# Patient Record
Sex: Female | Born: 1945 | Hispanic: No | Marital: Single | Smoking: Current every day smoker
Health system: Southern US, Community
[De-identification: ages and names within clinical notes are randomized; demographics above are authoritative.]

## PROBLEM LIST (undated history)

## (undated) DIAGNOSIS — E0590 Thyrotoxicosis, unspecified without thyrotoxic crisis or storm: Secondary | ICD-10-CM

## (undated) DIAGNOSIS — Z9889 Other specified postprocedural states: Secondary | ICD-10-CM

## (undated) DIAGNOSIS — K449 Diaphragmatic hernia without obstruction or gangrene: Secondary | ICD-10-CM

## (undated) DIAGNOSIS — I10 Essential (primary) hypertension: Secondary | ICD-10-CM

## (undated) DIAGNOSIS — J439 Emphysema, unspecified: Secondary | ICD-10-CM

## (undated) DIAGNOSIS — Z803 Family history of malignant neoplasm of breast: Secondary | ICD-10-CM

## (undated) DIAGNOSIS — M810 Age-related osteoporosis without current pathological fracture: Secondary | ICD-10-CM

## (undated) DIAGNOSIS — T4145XA Adverse effect of unspecified anesthetic, initial encounter: Secondary | ICD-10-CM

## (undated) DIAGNOSIS — H269 Unspecified cataract: Secondary | ICD-10-CM

## (undated) DIAGNOSIS — C50919 Malignant neoplasm of unspecified site of unspecified female breast: Secondary | ICD-10-CM

## (undated) DIAGNOSIS — I499 Cardiac arrhythmia, unspecified: Secondary | ICD-10-CM

## (undated) DIAGNOSIS — R112 Nausea with vomiting, unspecified: Secondary | ICD-10-CM

## (undated) DIAGNOSIS — T8859XA Other complications of anesthesia, initial encounter: Secondary | ICD-10-CM

## (undated) HISTORY — DX: Unspecified cataract: H26.9

## (undated) HISTORY — PX: CATARACT EXTRACTION: SUR2

## (undated) HISTORY — DX: Cardiac arrhythmia, unspecified: I49.9

## (undated) HISTORY — DX: Age-related osteoporosis without current pathological fracture: M81.0

## (undated) HISTORY — DX: Essential (primary) hypertension: I10

## (undated) HISTORY — DX: Family history of malignant neoplasm of breast: Z80.3

## (undated) HISTORY — DX: Emphysema, unspecified: J43.9

---

## 1990-04-01 DIAGNOSIS — K449 Diaphragmatic hernia without obstruction or gangrene: Secondary | ICD-10-CM

## 1990-04-01 HISTORY — DX: Diaphragmatic hernia without obstruction or gangrene: K44.9

## 1993-04-01 DIAGNOSIS — C50919 Malignant neoplasm of unspecified site of unspecified female breast: Secondary | ICD-10-CM

## 1993-04-01 HISTORY — PX: MASTECTOMY: SHX3

## 1993-04-01 HISTORY — DX: Malignant neoplasm of unspecified site of unspecified female breast: C50.919

## 1993-06-22 HISTORY — PX: TOTAL ABDOMINAL HYSTERECTOMY: SHX209

## 2003-01-31 ENCOUNTER — Other Ambulatory Visit: Admission: RE | Admit: 2003-01-31 | Discharge: 2003-01-31 | Payer: Self-pay | Admitting: Obstetrics & Gynecology

## 2003-05-11 ENCOUNTER — Other Ambulatory Visit: Admission: RE | Admit: 2003-05-11 | Discharge: 2003-05-11 | Payer: Self-pay | Admitting: Obstetrics & Gynecology

## 2003-08-03 ENCOUNTER — Other Ambulatory Visit: Admission: RE | Admit: 2003-08-03 | Discharge: 2003-08-03 | Payer: Self-pay | Admitting: Obstetrics & Gynecology

## 2004-02-02 ENCOUNTER — Other Ambulatory Visit: Admission: RE | Admit: 2004-02-02 | Discharge: 2004-02-02 | Payer: Self-pay | Admitting: Obstetrics & Gynecology

## 2004-09-04 ENCOUNTER — Other Ambulatory Visit: Admission: RE | Admit: 2004-09-04 | Discharge: 2004-09-04 | Payer: Self-pay | Admitting: Obstetrics & Gynecology

## 2016-09-26 LAB — PULMONARY FUNCTION TEST

## 2017-09-19 ENCOUNTER — Other Ambulatory Visit: Payer: Self-pay | Admitting: Obstetrics & Gynecology

## 2017-09-19 ENCOUNTER — Other Ambulatory Visit: Payer: Self-pay

## 2017-09-19 DIAGNOSIS — N63 Unspecified lump in unspecified breast: Secondary | ICD-10-CM

## 2017-09-19 DIAGNOSIS — N644 Mastodynia: Secondary | ICD-10-CM

## 2017-10-06 ENCOUNTER — Other Ambulatory Visit: Payer: Self-pay | Admitting: Obstetrics & Gynecology

## 2017-10-06 DIAGNOSIS — N6310 Unspecified lump in the right breast, unspecified quadrant: Secondary | ICD-10-CM

## 2017-10-20 ENCOUNTER — Other Ambulatory Visit: Payer: Self-pay

## 2017-10-22 ENCOUNTER — Other Ambulatory Visit: Payer: Self-pay

## 2017-10-22 ENCOUNTER — Ambulatory Visit
Admission: RE | Admit: 2017-10-22 | Discharge: 2017-10-22 | Disposition: A | Discharge status: Home / Self Care | Payer: Medicare Other | Source: Ambulatory Visit | Attending: Obstetrics & Gynecology | Admitting: Obstetrics & Gynecology

## 2017-10-22 DIAGNOSIS — N6310 Unspecified lump in the right breast, unspecified quadrant: Secondary | ICD-10-CM

## 2017-10-27 ENCOUNTER — Telehealth: Payer: Self-pay | Admitting: Oncology

## 2017-10-27 ENCOUNTER — Encounter: Payer: Self-pay | Admitting: Oncology

## 2017-10-27 ENCOUNTER — Telehealth: Payer: Self-pay | Admitting: *Deleted

## 2017-10-27 ENCOUNTER — Encounter: Payer: Self-pay | Admitting: Radiation Oncology

## 2017-10-27 NOTE — Telephone Encounter (Signed)
New referral received via msg from [facility]. Pt has been scheduled to see Dr. Jana Hakim on 8/8 at 4pm. Pt aware to arrive 30 minutes early. Letter mailed.

## 2017-10-27 NOTE — Telephone Encounter (Signed)
Spoke to pt regarding appt with Dr. Donne Hazel on 8/8. Confirmed appt date and time. Discussed would work on coordinating med/rad onc apts. Denies further needs at this time.

## 2017-11-03 NOTE — Progress Notes (Signed)
[name]  [phone] [phone]     ID: Elaine [name] DOB: [birth year]  MR#: [id number]  LKJ#:[id number]  Patient Care Team: Quentin Cornwall, MD as PCP - General (Family Medicine) Magrinat, Virgie Dad, MD as Consulting Physician (Oncology) Kyung Rudd, MD as Consulting Physician (Radiation Oncology) Rolm Bookbinder, MD as Consulting Physician (General Surgery) Lelon Perla, MD as Referring Physician (Internal Medicine) Maisie Fus, MD as Consulting Physician (Obstetrics and Gynecology) OTHER MD:  CHIEF COMPLAINT: Estrogen receptor positive lobular breast cancer  CURRENT TREATMENT: Awaiting definitive surgery   HISTORY OF CURRENT ILLNESS: Elaine [name] has a history of left breast cancer, status post mastectomy at [facility] under Dr. Arman Bogus in 1995.  She recalls this is being lymph node negative.  She did not receive radiation or chemotherapy but did take tamoxifen for 2 years.  Note that because of osteoporosis issues she was treated with raloxifene/Evista for approximately 8 years, between 1998 and 2005 per her recollection..   More recently she noted a change in her left breast while taking a shower in April 2019  She was already scheduled for mammography in June, which is when she had a unilateral right diagnostic mammography with tomography at [facility] in [place], [place] on 09/26/2017 showing: Highly suspicious mass within the lower inner quadrant of the right breast measuring 1.1 cm.  There is no mention of axillary imaging in the ultrasound report.  Accordingly on 10/22/2017 she proceeded to biopsy of the right breast area in question. The pathology from this procedure showed ([id number]): Breast, right, needle core biopsy, lower inner 5 o'clock position with invasive lobular carcinoma, E-cadherin negative, grade 1. Prognostic indicators significant for: Estrogen receptor, 70% positive with moderate staining intensity  and progesterone receptor, 80% positive with strong staining intensity. Proliferation marker Ki67 at 10%. HER2 negative with a signals ratio of 1.62 and number per cell 3.65  The patient's subsequent history is as detailed below.  INTERVAL HISTORY: The patient was evaluated in the multidisciplinary breast cancer clinic on 11/06/2017 accompanied by her sister, Tharon Aquas and her friend, Juanda Crumble. Her case was also presented at the multidisciplinary breast cancer conference 11/05/2017. At that time a preliminary plan was proposed: Breast MRI, breast conserving surgery surgery, genetics, Oncotype, possibly radiation.   REVIEW OF SYSTEMS: Macaria reports that for exercise, she was working out in the gym up until April 2019, when she backed off due to increasing allergies. 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 She was placed on oxygen on Monday, 11/03/2017 at which time she discontinued smoking. She denies any skin cancers. The patient denies unusual headaches, visual changes, nausea, vomiting, stiff neck, dizziness, or gait imbalance. There has been no worsening cough, phlegm production, or pleurisy, no chest pain or pressure, and no change in bowel or bladder habits. The patient denies fever, rash, bleeding, unexplained fatigue or unexplained weight loss. A detailed review of systems was otherwise entirely negative.   PAST MEDICAL HISTORY: Past Medical History:  Diagnosis Date  . Breast cancer ([place]) 1995   left   . Cataract   . Emphysema of lung ([place])   . Family history of breast cancer   . Hiatal hernia 1992  . Hypertension   . Hyperthyroidism   . Osteoporosis     PAST SURGICAL HISTORY: Past Surgical History:  Procedure Laterality Date  . TOTAL ABDOMINAL HYSTERECTOMY  06/22/1993    FAMILY HISTORY Family History  Problem Relation Age of Onset  .  Breast cancer Cousin 18  . Breast cancer Cousin        20's/30's  . Breast cancer Cousin        3 of Mcousins1xremoved (mom's  cousins) had breast cancer 70+   She notes that her father died from MI at age 25. Patients' mother died from aneurysm at age 54. The patient had 1 brother (died age 36 months from flu) and 2 sisters (1 deceased at 28 due to complications from DM).  The patient had 4 maternal great cousins with breast cancer all diagnosed in their 76's.  A paternal cousins daughter was diagnosed with breast cancer in her 34s. Patient denies anyone in her family having ovarian cancer.   GYNECOLOGIC HISTORY:  No LMP recorded. Menarche: 72 years old Age at first live birth: [place] P0 LMP: Hysterectomy with bilateral salpingo-oophorectomy at age 69: No HRT  SOCIAL HISTORY: She is semi-retired from Audiological scientist. She is single and lives alone. She doesn't have any pets. She is a Tourist information centre manager.      ADVANCED DIRECTIVES: Her HCPOA is her sister Tharon Aquas who can be reached at [phone]    HEALTH MAINTENANCE: Social History   Tobacco Use  . Smoking status: Former Research scientist (life sciences)  . Smokeless tobacco: Never Used  Substance Use Topics  . Alcohol use: Yes    Comment: Martini daily  . Drug use: Never     Colonoscopy: Refuses  PAP: Is post colonoscopy  Bone density: completed every 2 years with prolia injections, due for one on 11/19/2017.    Allergies  Allergen Reactions  . Bee Venom   . Codeine Nausea Only  . Macrobid [Nitrofurantoin Macrocrystal]   . Morphine And Related   . Penicillins   . Tyloxapol Nausea Only and Other (See Comments)    Extreme Drugged feeling, Doesn't affect pain    Current Outpatient Medications  Medication Sig Dispense Refill  . ANORO ELLIPTA 62.5-25 MCG/INH AEPB INHALE 1 PUFF ONCE DAILY  3  . azithromycin (ZITHROMAX) 250 MG tablet Take 250 mg by mouth every other day. Monday/WEDNESDAY/FRIDAY    . Denosumab (PROLIA Gal[place]) Inject into the skin every 6 (six) months.    Marland Kitchen ipratropium-albuterol (DUONEB) 0.5-2.5 (3) MG/3ML SOLN USE 1 VIAL IN NEBULIZER 1-2 TIMES DAILY  3  .  losartan-hydrochlorothiazide (HYZAAR) 50-12.5 MG tablet Take 1 tablet by mouth daily.  1  . SYNTHROID 75 MCG tablet Take 75 mcg by mouth daily.  1  . VENTOLIN HFA 108 (90 Base) MCG/ACT inhaler INHALE 2 PUFFS BY MOUTH EVERY 4 TO 6 HOURS AS NEEDED  3   No current facility-administered medications for this visit.     OBJECTIVE: Middle-aged white woman wearing oxygen by nasal cannula  Vitals:   11/06/17 1525  BP: (!) 126/58  Pulse: 70  Resp: 18  Temp: 98.4 F (36.9 C)  SpO2: 93%     Body mass index is 24.59 kg/m.   Wt Readings from Last 3 Encounters:  11/06/17 138 lb 12.8 oz (63 kg)  11/06/17 137 lb 12.8 oz (62.5 kg)      ECOG FS:2 - Symptomatic, <50% confined to bed  Ocular: Sclerae unicteric, pupils round and equal Ear-nose-throat: Full upper and lower plate Lymphatic: No cervical or supraclavicular adenopathy Lungs no rales or rhonchi Heart regular rate and rhythm Abd soft, nontender, positive bowel sounds MSK kyphosis and scoliosis but no focal spinal tenderness, no joint edema Neuro: non-focal, well-oriented, positive affect Breasts: There is a firm ill-defined area in the  lower inner right breast, not associated with erythema or nipple changes.  The right axilla is benign.  The left breast is status post mastectomy, with no evidence of chest wall recurrence.  The left axilla is benign.   LAB RESULTS:  CMP     Component Value Date/Time   NA 137 11/06/2017 1448   K 3.4 (L) 11/06/2017 1448   CL 103 11/06/2017 1448   CO2 23 11/06/2017 1448   GLUCOSE 170 (H) 11/06/2017 1448   BUN 14 11/06/2017 1448   CREATININE 0.98 11/06/2017 1448   CALCIUM 9.0 11/06/2017 1448   PROT 6.5 11/06/2017 1448   ALBUMIN 4.1 11/06/2017 1448   AST 15 11/06/2017 1448   ALT 17 11/06/2017 1448   ALKPHOS 62 11/06/2017 1448   BILITOT 1.2 11/06/2017 1448   GFRNONAA 56 (L) 11/06/2017 1448   GFRAA >60 11/06/2017 1448    No results found for: TOTALPROTELP, ALBUMINELP, A1GS, A2GS, BETS,  BETA2SER, GAMS, MSPIKE, SPEI  No results found for: KPAFRELGTCHN, LAMBDASER, KAPLAMBRATIO  Lab Results  Component Value Date   WBC 8.6 11/06/2017   NEUTROABS 5.0 11/06/2017   HGB 16.7 (H) 11/06/2017   HCT 46.9 (H) 11/06/2017   MCV 95.1 11/06/2017   PLT 210 11/06/2017    '@LASTCHEMISTRY'$ @  No results found for: LABCA2  No components found for: VHQION629  No results for input(s): INR in the last 168 hours.  No results found for: LABCA2  No results found for: BMW413  No results found for: KGM010  No results found for: UVO536  No results found for: CA2729  No components found for: HGQUANT  No results found for: CEA1 / No results found for: CEA1   No results found for: AFPTUMOR  No results found for: CHROMOGRNA  No results found for: PSA1  Appointment on 11/06/2017  Component Date Value Ref Range Status  . Sodium 11/06/2017 137  135 - 145 mmol/L Final  . Potassium 11/06/2017 3.4* 3.5 - 5.1 mmol/L Final  . Chloride 11/06/2017 103  98 - 111 mmol/L Final  . CO2 11/06/2017 23  22 - 32 mmol/L Final  . Glucose, Bld 11/06/2017 170* 70 - 99 mg/dL Final  . BUN 11/06/2017 14  8 - 23 mg/dL Final  . Creatinine 11/06/2017 0.98  0.44 - 1.00 mg/dL Final  . Calcium 11/06/2017 9.0  8.9 - 10.3 mg/dL Final  . Total Protein 11/06/2017 6.5  6.5 - 8.1 g/dL Final  . Albumin 11/06/2017 4.1  3.5 - 5.0 g/dL Final  . AST 11/06/2017 15  15 - 41 U/L Final  . ALT 11/06/2017 17  0 - 44 U/L Final  . Alkaline Phosphatase 11/06/2017 62  38 - 126 U/L Final  . Total Bilirubin 11/06/2017 1.2  0.3 - 1.2 mg/dL Final  . GFR, Est Non Af Am 11/06/2017 56* >60 mL/min Final  . GFR, Est AFR Am 11/06/2017 >60  >60 mL/min Final   Comment: (NOTE) The eGFR has been calculated using the CKD EPI equation. This calculation has not been validated in all clinical situations. eGFR's persistently <60 mL/min signify possible Chronic Kidney Disease.   Georgiann Hahn gap 11/06/2017 11  5 - 15 Final   Performed at [facility], [place] [address] [place], [place] [address]  . WBC Count 11/06/2017 8.6  3.9 - 10.3 K/uL Final  . RBC 11/06/2017 4.93  3.70 - 5.45 MIL/uL Final  . Hemoglobin 11/06/2017 16.7* 11.6 - 15.9 g/dL Final  . HCT 11/06/2017 46.9* 34.8 -  46.6 % Final  . MCV 11/06/2017 95.1  79.5 - 101.0 fL Final  . MCH 11/06/2017 33.9  25.1 - 34.0 pg Final  . MCHC 11/06/2017 35.6  31.5 - 36.0 g/dL Final  . RDW 11/06/2017 12.6  11.2 - 14.5 % Final  . Platelet Count 11/06/2017 210  145 - 400 K/uL Final  . Neutrophils Relative % 11/06/2017 57  % Final  . Neutro Abs 11/06/2017 5.0  1.5 - 6.5 K/uL Final  . Lymphocytes Relative 11/06/2017 28  % Final  . Lymphs Abs 11/06/2017 2.4  0.9 - 3.3 K/uL Final  . Monocytes Relative 11/06/2017 10  % Final  . Monocytes Absolute 11/06/2017 0.8  0.1 - 0.9 K/uL Final  . Eosinophils Relative 11/06/2017 4  % Final  . Eosinophils Absolute 11/06/2017 0.3  0.0 - 0.5 K/uL Final  . Basophils Relative 11/06/2017 1  % Final  . Basophils Absolute 11/06/2017 0.1  0.0 - 0.1 K/uL Final   Performed at [facility], [place] [address] [place], [place] [address]    (this displays the last labs from the last 3 days)  No results found for: TOTALPROTELP, ALBUMINELP, A1GS, A2GS, BETS, BETA2SER, GAMS, MSPIKE, SPEI (this displays SPEP labs)  No results found for: KPAFRELGTCHN, LAMBDASER, KAPLAMBRATIO (kappa/lambda light chains)  No results found for: HGBA, HGBA2QUANT, HGBFQUANT, HGBSQUAN (Hemoglobinopathy evaluation)   No results found for: LDH  No results found for: IRON, TIBC, IRONPCTSAT (Iron and TIBC)  No results found for: FERRITIN  Urinalysis No results found for: COLORURINE, APPEARANCEUR, LABSPEC, PHURINE, GLUCOSEU, HGBUR, BILIRUBINUR, KETONESUR, PROTEINUR, UROBILINOGEN, NITRITE, LEUKOCYTESUR   STUDIES:  Mm Clip Placement Right  Result Date: 10/22/2017 CLINICAL DATA:  Evaluate clip placement following ultrasound-guided RIGHT  breast biopsy. EXAM: DIAGNOSTIC RIGHT MAMMOGRAM POST ULTRASOUND BIOPSY COMPARISON:  Previous exam(s). FINDINGS: Mammographic images were obtained following ultrasound guided biopsy of the 1.1 cm mass at the 5 o'clock position of the RIGHT breast. The RIBBON shaped clip is in satisfactory position. IMPRESSION: Satisfactory RIBBON clip position following ultrasound-guided RIGHT breast biopsy. Final Assessment: Post Procedure Mammograms for Marker Placement Electronically Signed   By: Margarette Canada M.D.   On: 10/22/2017 15:51   [place] Rt Breast Bx W Loc Dev 1st Lesion Img Bx Spec US Guide  Addendum Date: 10/23/2017   ADDENDUM REPORT: 10/23/2017 14:45 ADDENDUM: Pathology revealed GRADE I INVASIVE MAMMARY CARCINOMA, MAMMARY CARCINOMA IN SITU WITH NECROSIS of the Right breast, lower inner 5 o'clock position. This was found to be concordant by Dr. Hassan Rowan. Pathology results were discussed with the patient by telephone. The patient reported doing well after the biopsy with tenderness at the site. Post biopsy instructions and care were reviewed and questions were answered. The patient was encouraged to call [facility] for any additional concerns. The patient was referred to [facility] on November 05, 2017. Pathology results reported by Terie Purser, RN on 10/23/2017. Electronically Signed   By: Margarette Canada M.D.   On: 10/23/2017 14:45   Result Date: 10/23/2017 CLINICAL DATA:  72 year old female for tissue sampling of 1.1 cm LOWER INNER RIGHT breast mass. EXAM: ULTRASOUND GUIDED RIGHT BREAST CORE NEEDLE BIOPSY COMPARISON:  Previous exam(s). FINDINGS: I met with the patient and we discussed the procedure of ultrasound-guided biopsy, including benefits and alternatives. We discussed the high likelihood of a successful procedure. We discussed the risks of the procedure, including infection, bleeding, tissue injury, clip  migration, and inadequate sampling. Informed written consent was given. The usual time-out protocol was performed immediately prior to the procedure. Using sterile technique and 1% Lidocaine as local anesthetic, under direct ultrasound visualization, a 12 gauge spring-loaded device was used to perform biopsy of the 1.1 cm hypoechoic mass at the 5 o'clock position of the RIGHT breast 6 cm from the nipple using a LATERAL approach. At the conclusion of the procedure a RIBBON tissue marker clip was deployed into the biopsy cavity. Follow up 2 view mammogram was performed and dictated separately. Following the procedure, evaluation of the RIGHT axilla demonstrates no abnormal appearing lymph nodes. IMPRESSION: Ultrasound guided biopsy of 1.1 cm LOWER INNER RIGHT breast mass. No apparent complications. No abnormal appearing RIGHT axillary lymph nodes. Electronically Signed: By: Margarette Canada M.D. On: 10/22/2017 15:40    ELIGIBLE FOR AVAILABLE RESEARCH PROTOCOL: no  ASSESSMENT: 72 y.o. [place] woman status post right breast lower inner quadrant biopsy 10/22/2017 for a clinical T1c N0, stage IA invasive lobular breast cancer, grade 1, estrogen and progesterone receptor positive, HER-2 not amplified, with an MIB-1 of 10%  (0) status post left mastectomy 1995 for a [place] N0 ductal breast cancer  (a) adjuvant tamoxifen x2 years  (b) note patient took raloxifene/ Evista 1998-2005 for osteoporosis  (1) genetics testing: Met with counselors 11/06/2016, testing being considered  (2) definitive surgery pending  (3) Oncotype DX to be obtained from the final surgical sample  (4) adjuvant radiation to follow as appropriate  (5) anastrozole to start of the completion of local treatment  (6) tobacco abuse: Patient stopped smoking 11/02/2016  (7) osteoporosis: Currently on denosumab/Prolia every 6 months  PLAN: We spent the better part of today's hour-long appointment discussing the biology of her diagnosis  and the specifics of her situation. We discussed the difference between lobular and ductal breast cancers and specifically the fact that lobular breast cancer cells do not make a "glue" called e-cadherin and therefore they do not form easily palpable masses.  They are also harder to see on mammography and ultrasonography because there is no sharp density border.  In short lobular breast cancers are harder to detect by standard methods and and for that reason an MRI is indicated before definitive surgery.  We then reviewed the fact that cancer is not one disease but more than 100 different diseases and that it is important to keep them separate-- otherwise when friends and relatives discuss their own cancer experiences with [place] confusion can result. Similarly we explained that if breast cancer spreads to the bone or liver, the patient would not have bone cancer or liver cancer, but breast cancer in the bone and breast cancer in the liver: one cancer in three places-- not 3 different cancers which otherwise would have to be treated in 3 different ways.  We discussed the difference between local and systemic therapy. In terms of loco-regional treatment, lumpectomy plus radiation is equivalent to mastectomy as far as survival is concerned. For this reason, and because the cosmetic results are generally superior, we recommend breast conserving surgery.   We then discussed the rationale for systemic therapy. There is some risk that this cancer may have already spread to other parts of her body. Patients frequently ask at this point about bone scans, CAT scans and PET scans to find out if they have occult breast cancer somewhere else. The problem is that in early stage disease we are much more likely to find false positives then true cancers  and this would expose the patient to unnecessary procedures as well as unnecessary radiation. Scans cannot answer the question the patient really would like to know, which is  whether she has microscopic disease elsewhere in her body. For those reasons we do not recommend them.  Of course we would proceed to aggressive evaluation of any symptoms that might suggest metastatic disease, but that is not the case here.  Next we went over the options for systemic therapy which are anti-estrogens, anti-HER-2 immunotherapy, and chemotherapy. Yulia does not meet criteria for anti-HER-2 immunotherapy. She is a good candidate for anti-estrogens.  The question of chemotherapy is more complicated. Chemotherapy is most effective in rapidly growing, aggressive tumors. It is much less effective in low-grade, slow growing cancers, like Kelsea 's. For that reason we are going to request an Oncotype from the definitive surgical sample, as suggested by NCCN guidelines. That will help [place] make a definitive decision regarding chemotherapy in this case.  She also qualifies for genetics testing. In patients who carry a deleterious mutation [for example in a  BRCA gene], the risk of a new breast cancer developing in the future may be sufficiently great that the patient may choose bilateral mastectomies. However if she wishes to keep her breasts in that situation it is safe to do so. That would require intensified screening, which generally means not only yearly mammography but a yearly breast MRI as well.   Isley decided against genetics testing today because "it will not make any difference in my case".  She will be willing to undergo genetics testing if that is what the family wants and they are trying to determine that.  In addition she is very reluctant to receive radiation.  We discussed the role of radiation in detail and she understands that it will cut the risk of local recurrence by more than half.  Once she has undergone surgery and has the pathology on hand, it will be possible for her radiation oncologist to give her fairly exact estimates of the risk of local recurrence with and without  radiation.  Note that if she does receive radiation this will probably done in [place] under the [facility] group.  Since she received almost 10 years of tamoxifen/raloxifene, her antiestrogen therapy should be an aromatase inhibitor and we will discuss that in more detail when she returns to see me in October.  [place] has a good understanding of the overall plan. She agrees with it. She knows the goal of treatment in her case is cure. She will call with any problems that may develop before her next visit here.  Magrinat, Virgie Dad, MD  11/06/17 4:04 PM Medical Oncology and Hematology [facility] [address] [place], [place] [address] Tel. [phone]    Fax. [phone]    I, Soijett Blue am acting as scribe for Dr. Sarajane Jews C. Magrinat.  I, Lurline Del MD, have reviewed the above documentation for accuracy and completeness, and I agree with the above.

## 2017-11-05 ENCOUNTER — Ambulatory Visit
Admission: RE | Admit: 2017-11-05 | Discharge: 2017-11-05 | Disposition: A | Discharge status: Home / Self Care | Payer: Self-pay | Source: Ambulatory Visit | Attending: Radiation Oncology | Admitting: Radiation Oncology

## 2017-11-05 ENCOUNTER — Inpatient Hospital Stay
Admission: RE | Admit: 2017-11-05 | Discharge: 2017-11-05 | Disposition: A | Discharge status: Home / Self Care | Payer: Self-pay | Source: Ambulatory Visit | Attending: Radiation Oncology | Admitting: Radiation Oncology

## 2017-11-05 ENCOUNTER — Other Ambulatory Visit: Payer: Self-pay

## 2017-11-05 ENCOUNTER — Other Ambulatory Visit: Payer: Self-pay | Admitting: Radiation Oncology

## 2017-11-05 DIAGNOSIS — C50919 Malignant neoplasm of unspecified site of unspecified female breast: Secondary | ICD-10-CM

## 2017-11-05 NOTE — Progress Notes (Signed)
Location of Breast Cancer: Right Breast: Malignant neoplasm of female breast,   Did patient present with symptoms (if so, please note symptoms) or was this found on screening mammography?: Patient felt a lump in April.  MEt with Dr. In June for routine mammogram.  Mammogram 09/26/2017: There is a region with tenting within the lower inner quadrant of the right breast which is adjacent to a 1.1 cm round, equal density mass with spiculated margins.   Ultrasound 09/26/2017: lower inner right breast, at the 5:00 position, there is a 1.1 x 0.9 cm irregular, not parallel, hypoechoic mass with spiculated margins and an echogenic halo.  CT Chest 11/05/2017: A 3 mm groundglass nodule is demonstrated within the central medial aspect of the right middle lobe.  No further evidence of pulmonary nodules, masses, infiltrates, effusions or edema.  The central bronchi are patent.  Histology per Pathology Report:Right Breast 10/27/2017  Receptor Status: ER(+ 70%), PR (+ 80%), Her2-neu (-), Ki-67(10%)  Past/Anticipated interventions by surgeon, if any: Appointment scheduled with Dr. Donne Hazel 11/06/2017 MRI breast ordered  Past/Anticipated interventions by medical oncology, if any: Chemotherapy  Appointment scheduled with Dr. Jana Hakim 11/06/2017 4 pm  Lymphedema issues, if any: No  Pain issues, if any:  No  BP 123/69 (BP Location: Right Arm, Patient Position: Sitting)   Pulse (!) 55   Temp 97.7 F (36.5 C) (Oral)   Resp (!) 24   Ht 5' 3"  (1.6 m)   Wt 137 lb 12.8 oz (62.5 kg)   SpO2 96%   PF (!) 2 L/min   BMI 24.41 kg/m    Wt Readings from Last 3 Encounters:  11/06/17 137 lb 12.8 oz (62.5 kg)   SAFETY ISSUES:  Prior radiation? No  Pacemaker/ICD? No  Possible current pregnancy? No  Is the patient on methotrexate? No  Current Complaints / other details:  Patient has had a left mastectomy 07/24/1993    Cori Razor, RN 11/05/2017,12:08 PM

## 2017-11-06 ENCOUNTER — Inpatient Hospital Stay: Payer: Medicare Other | Attending: Oncology | Admitting: Oncology

## 2017-11-06 ENCOUNTER — Encounter: Payer: Self-pay | Admitting: Radiation Oncology

## 2017-11-06 ENCOUNTER — Ambulatory Visit: Payer: Medicare Other | Admitting: Hematology and Oncology

## 2017-11-06 ENCOUNTER — Ambulatory Visit
Admission: RE | Admit: 2017-11-06 | Discharge: 2017-11-06 | Disposition: A | Discharge status: Home / Self Care | Payer: Medicare Other | Source: Ambulatory Visit | Attending: Radiation Oncology | Admitting: Radiation Oncology

## 2017-11-06 ENCOUNTER — Inpatient Hospital Stay: Payer: Medicare Other | Admitting: Genetics

## 2017-11-06 ENCOUNTER — Encounter: Payer: Self-pay | Admitting: Genetics

## 2017-11-06 ENCOUNTER — Encounter: Payer: Self-pay | Admitting: Oncology

## 2017-11-06 ENCOUNTER — Other Ambulatory Visit: Payer: Self-pay | Admitting: General Surgery

## 2017-11-06 ENCOUNTER — Inpatient Hospital Stay: Payer: Medicare Other

## 2017-11-06 ENCOUNTER — Other Ambulatory Visit: Payer: Self-pay

## 2017-11-06 VITALS — BP 123/69 | HR 55 | Temp 97.7°F | Resp 24 | Ht 63.0 in | Wt 137.8 lb

## 2017-11-06 DIAGNOSIS — J439 Emphysema, unspecified: Secondary | ICD-10-CM | POA: Diagnosis not present

## 2017-11-06 DIAGNOSIS — M810 Age-related osteoporosis without current pathological fracture: Secondary | ICD-10-CM | POA: Diagnosis not present

## 2017-11-06 DIAGNOSIS — Z90710 Acquired absence of both cervix and uterus: Secondary | ICD-10-CM | POA: Insufficient documentation

## 2017-11-06 DIAGNOSIS — Z880 Allergy status to penicillin: Secondary | ICD-10-CM | POA: Diagnosis not present

## 2017-11-06 DIAGNOSIS — Z170 Estrogen receptor positive status [ER+]: Secondary | ICD-10-CM

## 2017-11-06 DIAGNOSIS — Z87891 Personal history of nicotine dependence: Secondary | ICD-10-CM | POA: Diagnosis not present

## 2017-11-06 DIAGNOSIS — C50311 Malignant neoplasm of lower-inner quadrant of right female breast: Secondary | ICD-10-CM | POA: Diagnosis present

## 2017-11-06 DIAGNOSIS — E0590 Thyrotoxicosis, unspecified without thyrotoxic crisis or storm: Secondary | ICD-10-CM | POA: Insufficient documentation

## 2017-11-06 DIAGNOSIS — M818 Other osteoporosis without current pathological fracture: Secondary | ICD-10-CM

## 2017-11-06 DIAGNOSIS — Z9012 Acquired absence of left breast and nipple: Secondary | ICD-10-CM | POA: Diagnosis not present

## 2017-11-06 DIAGNOSIS — C50911 Malignant neoplasm of unspecified site of right female breast: Secondary | ICD-10-CM

## 2017-11-06 DIAGNOSIS — Z885 Allergy status to narcotic agent status: Secondary | ICD-10-CM | POA: Diagnosis not present

## 2017-11-06 DIAGNOSIS — C50919 Malignant neoplasm of unspecified site of unspecified female breast: Secondary | ICD-10-CM

## 2017-11-06 DIAGNOSIS — Z79899 Other long term (current) drug therapy: Secondary | ICD-10-CM

## 2017-11-06 DIAGNOSIS — Z833 Family history of diabetes mellitus: Secondary | ICD-10-CM

## 2017-11-06 DIAGNOSIS — Z853 Personal history of malignant neoplasm of breast: Secondary | ICD-10-CM

## 2017-11-06 DIAGNOSIS — Z881 Allergy status to other antibiotic agents status: Secondary | ICD-10-CM | POA: Insufficient documentation

## 2017-11-06 DIAGNOSIS — J438 Other emphysema: Secondary | ICD-10-CM

## 2017-11-06 DIAGNOSIS — Z803 Family history of malignant neoplasm of breast: Secondary | ICD-10-CM

## 2017-11-06 DIAGNOSIS — Z1379 Encounter for other screening for genetic and chromosomal anomalies: Secondary | ICD-10-CM

## 2017-11-06 HISTORY — DX: Diaphragmatic hernia without obstruction or gangrene: K44.9

## 2017-11-06 HISTORY — DX: Thyrotoxicosis, unspecified without thyrotoxic crisis or storm: E05.90

## 2017-11-06 HISTORY — DX: Malignant neoplasm of unspecified site of unspecified female breast: C50.919

## 2017-11-06 LAB — CMP (CANCER CENTER ONLY)
ALT: 17 U/L (ref 0–44)
AST: 15 U/L (ref 15–41)
Albumin: 4.1 g/dL (ref 3.5–5.0)
Alkaline Phosphatase: 62 U/L (ref 38–126)
Anion gap: 11 (ref 5–15)
BUN: 14 mg/dL (ref 8–23)
CO2: 23 mmol/L (ref 22–32)
Calcium: 9 mg/dL (ref 8.9–10.3)
Chloride: 103 mmol/L (ref 98–111)
Creatinine: 0.98 mg/dL (ref 0.44–1.00)
GFR, Est AFR Am: >60 mL/min (ref >60)
GFR, Estimated: 56 mL/min — ABNORMAL LOW (ref >60)
Glucose, Bld: 170 mg/dL — ABNORMAL HIGH (ref 70–99)
Potassium: 3.4 mmol/L — ABNORMAL LOW (ref 3.5–5.1)
Sodium: 137 mmol/L (ref 135–145)
Total Bilirubin: 1.2 mg/dL (ref 0.3–1.2)
Total Protein: 6.5 g/dL (ref 6.5–8.1)

## 2017-11-06 LAB — CBC WITH DIFFERENTIAL (CANCER CENTER ONLY)
Basophils Absolute: 0.1 10*3/uL (ref 0.0–0.1)
Basophils Relative: 1 %
Eosinophils Absolute: 0.3 10*3/uL (ref 0.0–0.5)
Eosinophils Relative: 4 %
HCT: 46.9 % — ABNORMAL HIGH (ref 34.8–46.6)
Hemoglobin: 16.7 g/dL — ABNORMAL HIGH (ref 11.6–15.9)
Lymphocytes Relative: 28 %
Lymphs Abs: 2.4 10*3/uL (ref 0.9–3.3)
MCH: 33.9 pg (ref 25.1–34.0)
MCHC: 35.6 g/dL (ref 31.5–36.0)
MCV: 95.1 fL (ref 79.5–101.0)
Monocytes Absolute: 0.8 10*3/uL (ref 0.1–0.9)
Monocytes Relative: 10 %
Neutro Abs: 5 10*3/uL (ref 1.5–6.5)
Neutrophils Relative %: 57 %
Platelet Count: 210 10*3/uL (ref 145–400)
RBC: 4.93 MIL/uL (ref 3.70–5.45)
RDW: 12.6 % (ref 11.2–14.5)
WBC Count: 8.6 10*3/uL (ref 3.9–10.3)

## 2017-11-06 NOTE — Progress Notes (Signed)
REFERRING PROVIDER: Rolm Bookbinder, MD [address] [place], [place] [address]  PRIMARY PROVIDER:  Quentin Cornwall, MD  PRIMARY REASON FOR VISIT:  1. Malignant neoplasm of female breast, unspecified estrogen receptor status, unspecified laterality, unspecified site of breast ([place])   2. Genetic testing   3. Lobular breast cancer, right ([place])   4. Family history of breast cancer   5. Personal history of breast cancer     HISTORY OF PRESENT ILLNESS:   Elaine [name], a 72 y.o. female, was seen for a [place] cancer genetics consultation at the request of Dr. Donne Hazel due to a personal and family history of braest cancer.  Elaine [name] presents to clinic today to discuss the possibility of a hereditary predisposition to cancer, genetic testing, and to further clarify her future cancer risks, as well as potential cancer risks for family members.   In 1995, at the age of 1, Elaine [name] reports she was diagnosed with left breast cancer and underwent left mastectomy.  Recently, in July 2019 she has been diagnosed with Invasive Mammary Carcinoma ER+, PR+, HER2- of the right breast.   HORMONAL RISK FACTORS:  Menarche was at age 60.  First live birth at age N/A.  Ovaries intact: no.  Hysterectomy: yes. 1995 Menopausal status: postmenopausal.  Colonoscopy: no; not examined.  Past Medical History:  Diagnosis Date  . Breast cancer ([place]) 1995   left   . Family history of breast cancer   . Hiatal hernia 1992  . Hyperthyroidism     Past Surgical History:  Procedure Laterality Date  . TOTAL ABDOMINAL HYSTERECTOMY  06/22/1993    Social History   Socioeconomic History  . Marital status: Unknown    Spouse name: Not on file  . Number of children: Not on file  . Years of education: Not on file  . Highest education level: Not on file  Occupational History  . Not on file  Social Needs  . Financial resource strain: Not on file  . Food insecurity:    Worry: Not on file     Inability: Not on file  . Transportation needs:    Medical: Not on file    Non-medical: Not on file  Tobacco Use  . Smoking status: Former Research scientist (life sciences)  . Smokeless tobacco: Never Used  Substance and Sexual Activity  . Alcohol use: Yes    Comment: Martini daily  . Drug use: Never  . Sexual activity: Not on file  Lifestyle  . Physical activity:    Days per week: Not on file    Minutes per session: Not on file  . Stress: Not on file  Relationships  . Social connections:    Talks on phone: Not on file    Gets together: Not on file    Attends religious service: Not on file    Active member of club or organization: Not on file    Attends meetings of clubs or organizations: Not on file    Relationship status: Not on file  Other Topics Concern  . Not on file  Social History Narrative  . Not on file     FAMILY HISTORY:  We obtained a detailed, 4-generation family history.  Significant diagnoses are listed below: Family History  Problem Relation Age of Onset  . Breast cancer Cousin 62  . Breast cancer Cousin        20's/30's  . Breast cancer Cousin        3 of Mcousins1xremoved (mom's cousins)  had breast cancer 70+   Elaine [name]' has no children.  She has 2 sisters and 1 brother: -1 brother died at 18 months  -1 sister died in her 84'O due to complications of Diabetes. She had 2 sons.  -1 sister is alive with no history of cancer, she has 2 sons.   Elaine [name] father: died at 37 with no history of cancer.  Paternal aunts/Uncles: 2 paternal uncles with no history of cancer Paternal cousins: no history of cancer.  1 cousin had a daughter (patient's cousin 1x removed) who developed breast cancer in her 47's.  Paternal grandfather: died at 40, no history of cancer Paternal grandmother:die at 10 with no history of cancer  Elaine [name] mother: died at 64 with no history of cancer.  Maternal Aunts/Uncles: 3 maternal aunts, 3 maternal uncles, 1 additional aunt/uncle who was still born.   No history of cancer in these relatives.  Maternal cousins: no history of cancer.  Maternal grandfather: no history of cancer.  He had 4 nieces (patient's cousins 1x removed) who developed breast cancer. 3 of these were dx 70+, the other cousin 1x removed was in her 20's/30's when she was diagnosed with breast cancer.  Maternal grandmother: deceased, no history of cancer.   [name] is unaware of previous family history of genetic testing for hereditary cancer risks. Patient's maternal ancestors are of N. European descent, and paternal ancestors are of N. European descent. There is no reported Ashkenazi Jewish ancestry. There is no known consanguinity.  GENETIC COUNSELING ASSESSMENT: Elaine [name] is a 72 y.o. female with a personal and family history which is somewhat suggestive of a Hereditary Cancer Predisposition Syndrome. We, therefore, discussed and recommended the following at today's visit.   DISCUSSION: We reviewed the characteristics, features and inheritance patterns of hereditary cancer syndromes. We also discussed genetic testing, including the appropriate family members to test, the process of testing, insurance coverage and turn-around-time for results. We discussed the implications of a negative, positive and/or variant of uncertain significant result. We recommended Elaine [name] pursue genetic testing for the Common Hereditary Cancers gene panel.    The Common Hereditary Cancer Panel offered by [facility] includes sequencing and/or deletion duplication testing of the following 47 genes: APC, ATM, AXIN2, BARD1, BMPR1A, BRCA1, BRCA2, BRIP1, CDH1, CDKN2A (p14ARF), CDKN2A (p16INK4a), CKD4, CHEK2, CTNNA1, DICER1, EPCAM (Deletion/duplication testing only), GREM1 (promoter region deletion/duplication testing only), KIT, MEN1, MLH1, MSH2, MSH3, MSH6, MUTYH, NBN, NF1, NHTL1, PALB2, PDGFRA, PMS2, POLD1, POLE, PTEN, RAD50, RAD51C, RAD51D, SDHB, SDHC, SDHD, SMAD4, SMARCA4. STK11, TP53, TSC1, TSC2, and  VHL.  The following genes were evaluated for sequence changes only: SDHA and HOXB13 c.251G>A variant only.  We discussed that only 5-10% of cancers are associated with a Hereditary cancer predisposition syndrome.  One of the most common hereditary cancer syndromes that increases breast cancer risk is called Hereditary Breast and Ovarian Cancer (HBOC) syndrome.  This syndrome is caused by mutations in the BRCA1 and BRCA2 genes.  This syndrome increases an individual's lifetime risk to develop breast, ovarian, pancreatic, and other types of cancer.  There are also many other cancer predisposition syndromes caused by mutations in several other genes.  We discussed that if she is found to have a mutation in one of these genes, it may impact surgical decisions, and alter future medical management recommendations such as increased cancer screenings and consideration of risk reducing surgeries.  A positive result could also have implications for the patient's family members.  If a  mutation is found, relatives could have genetic testing and potentially take advantage of risk reducing/proactive measures.   A Negative result would mean we were unable to identify a hereditary component to her cancer, but does not rule out the possibility of a hereditary basis for her cancer.  There could be mutations that are undetectable by current technology, or in genes not yet tested or identified to increase cancer risk.    We discussed the potential to find a Variant of Uncertain Significance or VUS.  These are variants that have not yet been identified as pathogenic or benign, and it is unknown if this variant is associated with increased cancer risk or if this is a normal finding.  Most VUS's are reclassified to benign or likely benign.   It should not be used to make medical management decisions. With time, we suspect the lab will determine the significance of any VUS's identified if any.   Based on Elaine [name] personal and  family history of cancer, she meets medical criteria for genetic testing. We would expect this to be covered by her insurance, but may be subject to the paramaters of her insurance plan.  The laboratory can provide her with an estimate of her OOP cost.   PLAN: Despite our recommendation, Elaine [name] did not wish to pursue genetic testing at today's visit. We understand this decision, and remain available to coordinate genetic testing at any time in the future. We; therefore, recommend Elaine [name] continue to follow the cancer screening guidelines given by her oncology/ healthcare providers.  She was provided my contact information and an information sheet about BRCA1/2 to take home.  She is aware she can contact us in the future and we would be happy to coordinate testing for her at any time.   Lastly, we encouraged Elaine [name] to remain in contact with cancer genetics annually so that we can continuously update the family history and inform her of any changes in cancer genetics and testing that may be of benefit for this family.   Ms.  [name] questions were answered to her satisfaction today. Our contact information was provided should additional questions or concerns arise. Thank you for the referral and allowing [place] to share in the care of your patient.   Tana Felts, MS, [facility] Certified Genetic Counselor [email or web address] phone: [phone]  The patient was seen for a total of 20 minutes in face-to-face genetic counseling.  This patient was discussed with Drs. Magrinat, Lindi Adie and/or Burr Medico who agrees with the above.

## 2017-11-06 NOTE — Progress Notes (Unsigned)
Mri br

## 2017-11-06 NOTE — Progress Notes (Addendum)
Radiation Oncology         ([phone]) [phone] ________________________________  Name: PAIDYN [name]        MRN: [id number]  Date of Service: 11/06/2017 DOB: Mar 03, 1946  UY:[id number], Elaine Route, MD  Rolm Bookbinder, MD     REFERRING PHYSICIAN: Rolm Bookbinder, MD   DIAGNOSIS: The encounter diagnosis was Malignant neoplasm of lower-inner quadrant of right breast of female, estrogen receptor positive ([place]).   HISTORY OF PRESENT ILLNESS: Elaine [name] is a 72 y.o. female seen at the request of Dr. Garnette Czech for newly diagnosed right breast cancer.  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  She states that she took 2 years of tamoxifen.  She did not require any adjuvant systemic or radiotherapy.  She noted an area in the right breast recently and was seen in [facility] for mammography which revealed a mass in the lower inner quadrant of the right breast.  Ultrasound revealed a 1.1 x 0.9 cm mass in the lower inner quadrant.  An axillary ultrasound was not performed.  She underwent right sided needle biopsy on 10/22/2017 in [place] at the breast center, and the right axilla was also assessed with no evidence of adenopathy.  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  She is meeting with genetic counseling today and will see Dr. Jana Hakim this afternoon.  She comes to discuss options of treatment for her cancer and is contemplating mastectomy versus lumpectomy.   PREVIOUS RADIATION THERAPY: No   PAST MEDICAL HISTORY:  Past Medical History:  Diagnosis Date  . Breast cancer ([place]) 1995   left   . Family history of breast cancer   . Hiatal hernia 1992  . Hyperthyroidism        PAST SURGICAL HISTORY: Past Surgical History:  Procedure Laterality Date  . TOTAL ABDOMINAL HYSTERECTOMY  06/22/1993      FAMILY HISTORY:  Family History  Problem Relation Age of Onset  . Breast cancer Cousin 46  . Breast cancer Cousin        20's/30's  . Breast cancer Cousin        3 of Mcousins1xremoved (mom's cousins) had breast cancer 70+     SOCIAL HISTORY:  reports that she has quit smoking. She has never used smokeless tobacco. She reports that she drinks alcohol. She reports that she does not use drugs.The patient is single but in a relationship. She lives in [place], [place].   ALLERGIES: Bee venom; Codeine; Macrobid [nitrofurantoin macrocrystal]; Morphine and related; Penicillins; and Tyloxapol   MEDICATIONS:  Current Outpatient Medications  Medication Sig Dispense Refill  . ANORO ELLIPTA 62.5-25 MCG/INH AEPB INHALE 1 PUFF ONCE DAILY  3  . azithromycin (ZITHROMAX) 250 MG tablet TAKE 1 TABLET BY MOUTH ON MONDAY, WEDNESDAY, AND FRIDAY ONLY  2  . denosumab (PROLIA) 60 MG/ML SOSY injection Inject 60 mg into the skin every 6 (six) months.    Marland Kitchen ipratropium-albuterol (DUONEB) 0.5-2.5 (3) MG/3ML SOLN USE 1 VIAL IN NEBULIZER 1-2 TIMES DAILY  3  . losartan-hydrochlorothiazide (HYZAAR) 50-12.5 MG tablet Take 1 tablet by mouth daily.  1  . predniSONE (DELTASONE) 10 MG tablet TAKE 2 TABLET BY MOUTH TWICE A DAY  0  . SYNTHROID 75 MCG tablet Take 75 mcg by mouth daily.  1  . VENTOLIN HFA 108 (  90 Base) MCG/ACT inhaler INHALE 2 PUFFS BY MOUTH EVERY 4 TO 6 HOURS AS NEEDED  3   No current facility-administered medications for this encounter.      REVIEW OF SYSTEMS: On review of systems, the patient reports that she is doing well overall. She denies any chest pain, shortness of breath, cough, fevers, chills, night sweats, unintended weight changes. She denies any bowel or bladder disturbances, and denies abdominal pain, nausea or vomiting. She denies any new musculoskeletal or joint aches or pains. A complete review of systems is obtained and is otherwise negative.     PHYSICAL EXAM:  Wt Readings from  Last 3 Encounters:  11/06/17 137 lb 12.8 oz (62.5 kg)   Temp Readings from Last 3 Encounters:  11/06/17 97.7 F (36.5 C) (Oral)   BP Readings from Last 3 Encounters:  11/06/17 123/69   Pulse Readings from Last 3 Encounters:  11/06/17 (!) 55    In general this is a well appearing caucasian female in no acute distress. She's alert and oriented x4 and appropriate throughout the examination. Cardiopulmonary assessment is negative for acute distress and she exhibits normal effort. Breast exam is deferred.    ECOG = 1  0 - Asymptomatic (Fully active, able to carry on all predisease activities without restriction)  1 - Symptomatic but completely ambulatory (Restricted in physically strenuous activity but ambulatory and able to carry out work of a light or sedentary nature. For example, light housework, office work)  2 - Symptomatic, <50% in bed during the day (Ambulatory and capable of all self care but unable to carry out any work activities. Up and about more than 50% of waking hours)  3 - Symptomatic, >50% in bed, but not bedbound (Capable of only limited self-care, confined to bed or chair 50% or more of waking hours)  4 - Bedbound (Completely disabled. Cannot carry on any self-care. Totally confined to bed or chair)  5 - Death   Eustace Pen MM, Creech RH, Tormey DC, et al. [phone]). "Toxicity and response criteria of the [facility]". [place] Oncol. 5 (6): 649-55    LABORATORY DATA:  No results found for: WBC, HGB, HCT, MCV, PLT No results found for: NA, K, CL, CO2 No results found for: ALT, AST, GGT, ALKPHOS, BILITOT    RADIOGRAPHY: Mm Clip Placement Right  Result Date: 10/22/2017 CLINICAL DATA:  Evaluate clip placement following ultrasound-guided RIGHT breast biopsy. EXAM: DIAGNOSTIC RIGHT MAMMOGRAM POST ULTRASOUND BIOPSY COMPARISON:  Previous exam(s). FINDINGS: Mammographic images were obtained following ultrasound guided biopsy of the 1.1 cm mass at  the 5 o'clock position of the RIGHT breast. The RIBBON shaped clip is in satisfactory position. IMPRESSION: Satisfactory RIBBON clip position following ultrasound-guided RIGHT breast biopsy. Final Assessment: Post Procedure Mammograms for Marker Placement Electronically Signed   By: Margarette Canada M.D.   On: 10/22/2017 15:51   [place] Rt Breast Bx W Loc Dev 1st Lesion Img Bx Spec US Guide  Addendum Date: 10/23/2017   ADDENDUM REPORT: 10/23/2017 14:45 ADDENDUM: Pathology revealed GRADE I INVASIVE MAMMARY CARCINOMA, MAMMARY CARCINOMA IN SITU WITH NECROSIS of the Right breast, lower inner 5 o'clock position. This was found to be concordant by Dr. Hassan Rowan. Pathology results were discussed with the patient by telephone. The patient reported doing well after the biopsy with tenderness at the site. Post biopsy instructions and care were reviewed and questions were answered. The patient was encouraged to call [facility] for any additional  concerns. The patient was referred to [facility] on November 05, 2017. Pathology results reported by Terie Purser, RN on 10/23/2017. Electronically Signed   By: Margarette Canada M.D.   On: 10/23/2017 14:45   Result Date: 10/23/2017 CLINICAL DATA:  72 year old female for tissue sampling of 1.1 cm LOWER INNER RIGHT breast mass. EXAM: ULTRASOUND GUIDED RIGHT BREAST CORE NEEDLE BIOPSY COMPARISON:  Previous exam(s). FINDINGS: I met with the patient and we discussed the procedure of ultrasound-guided biopsy, including benefits and alternatives. We discussed the high likelihood of a successful procedure. We discussed the risks of the procedure, including infection, bleeding, tissue injury, clip migration, and inadequate sampling. Informed written consent was given. The usual time-out protocol was performed immediately prior to the procedure. Using sterile technique and 1% Lidocaine as local anesthetic,  under direct ultrasound visualization, a 12 gauge spring-loaded device was used to perform biopsy of the 1.1 cm hypoechoic mass at the 5 o'clock position of the RIGHT breast 6 cm from the nipple using a LATERAL approach. At the conclusion of the procedure a RIBBON tissue marker clip was deployed into the biopsy cavity. Follow up 2 view mammogram was performed and dictated separately. Following the procedure, evaluation of the RIGHT axilla demonstrates no abnormal appearing lymph nodes. IMPRESSION: Ultrasound guided biopsy of 1.1 cm LOWER INNER RIGHT breast mass. No apparent complications. No abnormal appearing RIGHT axillary lymph nodes. Electronically Signed: By: Margarette Canada M.D. On: 10/22/2017 15:40       IMPRESSION/PLAN: 1. 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 Dr. Lisbeth Renshaw discusses the pathology findings and reviews the nature of right breast disease. The consensus from the breast conference includes proceeding with MRI of the breast followed by either mastectomy or breast conservation with lumpectomy with sentinel node biopsy. Depending on the size of the final tumor measurements rendered by pathology, the tumor may be tested for Oncotype Dx score to determine a role for systemic therapy. Provided that chemotherapy is not indicated, the patient's course would then be followed by external radiotherapy to the breast followed by antiestrogen therapy. We discussed the risks, benefits, short, and long term effects of radiotherapy, and the patient is going to consider her options for surgery. Dr. Lisbeth Renshaw discusses the delivery and logistics of radiotherapy and anticipates a course of 4 1/2 weeks of radiotherapy if she proceeds with conservation surgery. We will see her back about 2 weeks after surgery to discuss the simulation process and anticipate we starting radiotherapy about 4-6 weeks after surgery.  2. Remote history of left ER/PR positive invasive ductal  carcinoma. The patient has been NED and will be followed expectantly. 3. Possible genetic predisposition to malignancy. The patient is a candidate for genetic testing given her personal history, and will meet with genetics today.   In a visit lasting 60 minutes, greater than 50% of the time was spent face to face discussing her case, and coordinating the patient's care.   The above documentation reflects my direct findings during this shared patient visit. Please see the separate note by Dr. Lisbeth Renshaw on this date for the remainder of the patient's plan of care.    Carola Rhine, PAC

## 2017-11-07 ENCOUNTER — Encounter: Payer: Self-pay | Admitting: *Deleted

## 2017-11-07 ENCOUNTER — Telehealth: Payer: Self-pay | Admitting: Oncology

## 2017-11-07 NOTE — Telephone Encounter (Signed)
Per 8/8 sch msg.  Called patient to make appt w/ GM; Oct 2, 11 am.

## 2017-11-20 ENCOUNTER — Other Ambulatory Visit: Payer: Medicare Other

## 2017-11-20 ENCOUNTER — Ambulatory Visit
Admission: RE | Admit: 2017-11-20 | Discharge: 2017-11-20 | Disposition: A | Discharge status: Home / Self Care | Payer: Medicare Other | Source: Ambulatory Visit | Attending: General Surgery | Admitting: General Surgery

## 2017-11-20 DIAGNOSIS — Z170 Estrogen receptor positive status [ER+]: Secondary | ICD-10-CM

## 2017-11-20 DIAGNOSIS — C50311 Malignant neoplasm of lower-inner quadrant of right female breast: Secondary | ICD-10-CM

## 2017-11-20 MED ORDER — GADOBENATE DIMEGLUMINE 529 MG/ML IV SOLN
12.0000 mL | Freq: Once | INTRAVENOUS | Status: AC | PRN
  Administered 2017-11-20: 12 mL via INTRAVENOUS

## 2017-11-21 ENCOUNTER — Other Ambulatory Visit: Payer: Self-pay | Admitting: General Surgery

## 2017-11-21 ENCOUNTER — Other Ambulatory Visit: Payer: Self-pay | Admitting: *Deleted

## 2017-11-21 DIAGNOSIS — Z170 Estrogen receptor positive status [ER+]: Secondary | ICD-10-CM

## 2017-11-21 DIAGNOSIS — R928 Other abnormal and inconclusive findings on diagnostic imaging of breast: Secondary | ICD-10-CM

## 2017-11-21 DIAGNOSIS — C50311 Malignant neoplasm of lower-inner quadrant of right female breast: Secondary | ICD-10-CM

## 2017-11-24 ENCOUNTER — Ambulatory Visit
Admission: RE | Admit: 2017-11-24 | Discharge: 2017-11-24 | Disposition: A | Discharge status: Home / Self Care | Payer: Medicare Other | Source: Ambulatory Visit | Attending: General Surgery | Admitting: General Surgery

## 2017-11-24 ENCOUNTER — Ambulatory Visit: Admission: RE | Admit: 2017-11-24 | Payer: Medicare Other | Source: Ambulatory Visit

## 2017-11-24 DIAGNOSIS — R928 Other abnormal and inconclusive findings on diagnostic imaging of breast: Secondary | ICD-10-CM

## 2017-11-24 MED ORDER — GADOBENATE DIMEGLUMINE 529 MG/ML IV SOLN
12.0000 mL | Freq: Once | INTRAVENOUS | Status: AC | PRN
  Administered 2017-11-24: 12 mL via INTRAVENOUS

## 2017-12-02 ENCOUNTER — Other Ambulatory Visit: Payer: Self-pay | Admitting: General Surgery

## 2017-12-02 DIAGNOSIS — Z170 Estrogen receptor positive status [ER+]: Secondary | ICD-10-CM

## 2017-12-02 DIAGNOSIS — C50311 Malignant neoplasm of lower-inner quadrant of right female breast: Secondary | ICD-10-CM

## 2017-12-17 ENCOUNTER — Telehealth: Payer: Self-pay | Admitting: Oncology

## 2017-12-17 NOTE — Telephone Encounter (Signed)
LVM for pt regarding upcoming appts per 9/16 sch message.  °

## 2017-12-22 NOTE — Pre-Procedure Instructions (Signed)
KATERI BALCH  [birth year]      [id number] [address], [place] [address] Phone: [phone] Fax: [phone]    Your procedure is scheduled on Tuesday, October 1st.  Report to [facility] Admitting at 12:00 P.M.  Call this number if you have problems the morning of surgery:  [phone]   Remember:  Do not eat after midnight.   You may drink clear liquids until 11:00am .  Clear liquids allowed are:   Water, Juice (non-citric and without pulp), Carbonated beverages, Black Coffee only, Plain Jell-O only and Gatorade   Please complete your PRE-SURGERY ENSURE that was given to by 11:00 A.M. the morning of surgery.  Please, if able, drink it in one setting. DO NOT SIP.   Take these medicines the morning of surgery with A SIP OF WATER  SYNTHROID Please take your inhalers.   7 days prior to surgery STOP taking any Aspirin(unless otherwise instructed by your surgeon), Aleve, Naproxen, Ibuprofen, Motrin, Advil, Goody's, BC's, all herbal medications, fish oil, and all vitamins    Do not wear jewelry, make-up or nail polish.  Do not wear lotions, powders, or perfumes, or deodorant.  Do not shave 48 hours prior to surgery.   Do not bring valuables to the hospital.  [facility] is not responsible for any belongings or valuables.  Contacts, dentures or bridgework may not be worn into surgery.  Leave your suitcase in the car.  After surgery it may be brought to your room.  For patients admitted to the hospital, discharge time will be determined by your treatment team.  Patients discharged the day of surgery will not be allowed to drive home.   Special instructions:   [facility]- Preparing For Surgery  Before surgery, you can play an important role. Because skin is not sterile, your skin needs to be as free of germs as possible. You can reduce the number of germs on your skin by washing with CHG (chlorahexidine gluconate)  Soap before surgery.  CHG is an antiseptic cleaner which kills germs and bonds with the skin to continue killing germs even after washing.    Oral Hygiene is also important to reduce your risk of infection.  Remember - BRUSH YOUR TEETH THE MORNING OF SURGERY WITH YOUR REGULAR TOOTHPASTE  Please do not use if you have an allergy to CHG or antibacterial soaps. If your skin becomes reddened/irritated stop using the CHG.  Do not shave (including legs and underarms) for at least 48 hours prior to first CHG shower. It is OK to shave your face.  Please follow these instructions carefully.   1. Shower the NIGHT BEFORE SURGERY and the MORNING OF SURGERY with CHG.   2. If you chose to wash your hair, wash your hair first as usual with your normal shampoo.  3. After you shampoo, rinse your hair and body thoroughly to remove the shampoo.  4. Use CHG as you would any other liquid soap. You can apply CHG directly to the skin and wash gently with a scrungie or a clean washcloth.   5. Apply the CHG Soap to your body ONLY FROM THE NECK DOWN.  Do not use on open wounds or open sores. Avoid contact with your eyes, ears, mouth and genitals (private parts). Wash Face and genitals (private parts)  with your normal soap.  6. Wash thoroughly, paying special attention to the area where your surgery will  be performed.  7. Thoroughly rinse your body with warm water from the neck down.  8. DO NOT shower/wash with your normal soap after using and rinsing off the CHG Soap.  9. Pat yourself dry with a CLEAN TOWEL.  10. Wear CLEAN PAJAMAS to bed the night before surgery, wear comfortable clothes the morning of surgery  11. Place CLEAN SHEETS on your bed the night of your first shower and DO NOT SLEEP WITH PETS.    Day of Surgery:  Do not apply any deodorants/lotions.  Please wear clean clothes to the hospital/surgery center.   Remember to brush your teeth WITH YOUR REGULAR TOOTHPASTE.  Please read over the  following fact sheets that you were given.

## 2017-12-23 ENCOUNTER — Encounter (HOSPITAL_COMMUNITY)
Admission: RE | Admit: 2017-12-23 | Discharge: 2017-12-23 | Disposition: A | Discharge status: Home / Self Care | Payer: Medicare Other | Source: Ambulatory Visit | Attending: General Surgery | Admitting: General Surgery

## 2017-12-23 ENCOUNTER — Encounter (HOSPITAL_COMMUNITY): Payer: Self-pay

## 2017-12-23 ENCOUNTER — Other Ambulatory Visit: Payer: Self-pay

## 2017-12-23 DIAGNOSIS — E039 Hypothyroidism, unspecified: Secondary | ICD-10-CM | POA: Diagnosis not present

## 2017-12-23 DIAGNOSIS — Z90710 Acquired absence of both cervix and uterus: Secondary | ICD-10-CM | POA: Insufficient documentation

## 2017-12-23 DIAGNOSIS — Z01812 Encounter for preprocedural laboratory examination: Secondary | ICD-10-CM | POA: Insufficient documentation

## 2017-12-23 DIAGNOSIS — I10 Essential (primary) hypertension: Secondary | ICD-10-CM | POA: Insufficient documentation

## 2017-12-23 DIAGNOSIS — J439 Emphysema, unspecified: Secondary | ICD-10-CM | POA: Diagnosis not present

## 2017-12-23 DIAGNOSIS — K449 Diaphragmatic hernia without obstruction or gangrene: Secondary | ICD-10-CM | POA: Insufficient documentation

## 2017-12-23 DIAGNOSIS — Z87891 Personal history of nicotine dependence: Secondary | ICD-10-CM | POA: Insufficient documentation

## 2017-12-23 DIAGNOSIS — Z01810 Encounter for preprocedural cardiovascular examination: Secondary | ICD-10-CM | POA: Insufficient documentation

## 2017-12-23 DIAGNOSIS — C50911 Malignant neoplasm of unspecified site of right female breast: Secondary | ICD-10-CM | POA: Insufficient documentation

## 2017-12-23 DIAGNOSIS — Z9012 Acquired absence of left breast and nipple: Secondary | ICD-10-CM | POA: Diagnosis not present

## 2017-12-23 DIAGNOSIS — Z853 Personal history of malignant neoplasm of breast: Secondary | ICD-10-CM | POA: Diagnosis not present

## 2017-12-23 HISTORY — DX: Nausea with vomiting, unspecified: R11.2

## 2017-12-23 HISTORY — DX: Other complications of anesthesia, initial encounter: T88.59XA

## 2017-12-23 HISTORY — DX: Adverse effect of unspecified anesthetic, initial encounter: T41.45XA

## 2017-12-23 HISTORY — DX: Nausea with vomiting, unspecified: Z98.890

## 2017-12-23 LAB — CBC
HCT: 46.4 % — ABNORMAL HIGH (ref 36.0–46.0)
Hemoglobin: 15.7 g/dL — ABNORMAL HIGH (ref 12.0–15.0)
MCH: 33.4 pg (ref 26.0–34.0)
MCHC: 33.8 g/dL (ref 30.0–36.0)
MCV: 98.7 fL (ref 78.0–100.0)
Platelets: 242 10*3/uL (ref 150–400)
RBC: 4.7 MIL/uL (ref 3.87–5.11)
RDW: 12.3 % (ref 11.5–15.5)
WBC: 10.9 10*3/uL — ABNORMAL HIGH (ref 4.0–10.5)

## 2017-12-23 LAB — BASIC METABOLIC PANEL
Anion gap: 11 (ref 5–15)
BUN: 15 mg/dL (ref 8–23)
CO2: 28 mmol/L (ref 22–32)
Calcium: 9.4 mg/dL (ref 8.9–10.3)
Chloride: 100 mmol/L (ref 98–111)
Creatinine, Ser: 0.89 mg/dL (ref 0.44–1.00)
GFR calc Af Amer: >60 mL/min (ref >60)
GFR calc non Af Amer: >60 mL/min (ref >60)
Glucose, Bld: 96 mg/dL (ref 70–99)
Potassium: 4.2 mmol/L (ref 3.5–5.1)
Sodium: 139 mmol/L (ref 135–145)

## 2017-12-23 NOTE — Progress Notes (Signed)
Anesthesia Chart Review:  Case:  [id number] Date/Time:  12/30/17 1345   Procedure:  MASTECTOMY WITH SENTINEL LYMPH NODE BIOPSY (Right Breast)   Anesthesia type:  General   Pre-op diagnosis:  RIGHT BREAST CANCER   Location:  [place] [facility]   Surgeon:  Rolm Bookbinder, MD      DISCUSSION: 72 yo female former smoker. Pertinent hx includes Hypothyroid, HTN, COPD on 2L O2 continuously, Hiatal hernia, Breast CA s/p left mastectomy.  I saw pt at PAT to clarify her pulmonary history. She reports she was working out in [employer] with a trainer up until April 2019. Over the summer she developed some progressive DOE and was seen at urgent care. She was placed on O2 and referred to pulmonology. She also discontinued smoking at that time. She was then seen by Dr. Koleen Nimrod at [facility] in [place]. His notes indicate she has COPD and ground glass opacity of the right lung. He prescribed azithromycin to be taken MWF continuously. Plans to follow with repeat CT after surgery. In his preop clearance dated 11/19/2017 he advised her to continue with 2L O2 for her upcoming surgery.  She denies any recent illness or URI symptoms. She continues to remain active, said this week she was scrubbing siding on her house and cleaning her fencing. She does not feel SOB at rest but does have dyspnea with moderate exertion. She also reports previously frequently awakening at night with feeling that her "throat was closing" but says this resolved since starting prednisone. She has not had a sleep apnea evaluation.  She also said she was told by Dr. Donne Hazel that he would consult Dr. Chase Caller while she was admitted.   Pulmonary function appears stable at this time. Anticipate she can proceed with surgery as planned barring acute status change.  VS: BP (!) 113/56   Pulse 79   Temp 36.4 C   Resp 20   Ht 5\' 3"  (1.6 m)   Wt 63.7 kg   SpO2 96%   BMI 24.89 kg/m   PROVIDERS: Quentin Cornwall, MD as PCP - General (Family  Medicine) Magrinat, Virgie Dad, MD as Consulting Physician (Oncology) Kyung Rudd, MD as Consulting Physician (Radiation Oncology) Rolm Bookbinder, MD as Consulting Physician (General Surgery) Lelon Perla, MD as Referring Physician (Internal Medicine) Maisie Fus, MD as Consulting Physician (Obstetrics and Gynecology) Linde Gillis, MD as Consulting Physician (Pulmonology)  LABS: Labs reviewed: Acceptable for surgery. (all labs ordered are listed, but only abnormal results are displayed)  Labs Reviewed  CBC - Abnormal; Notable for the following components:      Result Value   WBC 10.9 (*)    Hemoglobin 15.7 (*)    HCT 46.4 (*)    All other components within normal limits  BASIC METABOLIC PANEL     IMAGES: CT Chest 07/24/2017 [facility]): Impression: 3 mm groundglass nodule right middle lobe.  Repeat surveillance evaluation in 6 months with chest CT recommend.  Diffuse emphysematous changes within the lungs.  No further evidence of focal acute abnormalities.   EKG: 12/23/17: Sinus brady 59bpm   CV: N/A  Past Medical History:  Diagnosis Date  . Breast cancer ([place]) 1995   left   . Cataract   . Complication of anesthesia   . Emphysema of lung ([place])   . Family history of breast cancer   . Hiatal hernia 1992  . Hypertension   . Hyperthyroidism   . Osteoporosis   . PONV (  postoperative nausea and vomiting)     Past Surgical History:  Procedure Laterality Date  . MASTECTOMY Left 1995  . TOTAL ABDOMINAL HYSTERECTOMY  06/22/1993    MEDICATIONS: . azithromycin (ZITHROMAX) 250 MG tablet  . denosumab (PROLIA) 60 MG/ML SOSY injection  . ipratropium-albuterol (DUONEB) 0.5-2.5 (3) MG/3ML SOLN  . losartan-hydrochlorothiazide (HYZAAR) 50-12.5 MG tablet  . OXYGEN  . predniSONE (DELTASONE) 5 MG tablet  . SYNTHROID 75 MCG tablet  . umeclidinium-vilanterol (ANORO ELLIPTA) 62.5-25 MCG/INH AEPB  . VENTOLIN HFA 108 (90 Base) MCG/ACT inhaler   No current  facility-administered medications for this encounter.      [name] [facility]/Anesthesiology Phone [phone] 12/23/2017 4:00 PM

## 2017-12-23 NOTE — Progress Notes (Signed)
PCP - Dr. Darlina Sicilian Pulmonologist-Dr. Teena Dunk [facility] Cardiologist - denies  Chest x-ray - N/A EKG - 12/23/17 Stress Test - 5+ years ago ECHO - denies Cardiac Cath - denies  Sleep Study - denies  Pt on 2L of continuous O2. Pt was started on oxygen by pulmonologist and told to continue until after surgery. All last office visit notes from pulmonologist were sent to Dr. Cristal Generous office by patient. Spoke with Abigail Butts at [employer] and requested that those documents be faxed to PAT for anesthesia review.   Anesthesia review: Yes, pt seen in PAT appointment by Baldwin Crown.    Patient denies shortness of breath, fever, cough and chest pain at PAT appointment   Patient verbalized understanding of instructions that were given to them at the PAT appointment. Patient was also instructed that they will need to review over the PAT instructions again at home before surgery.

## 2017-12-30 ENCOUNTER — Ambulatory Visit (HOSPITAL_COMMUNITY): Payer: Medicare Other | Admitting: Vascular Surgery

## 2017-12-30 ENCOUNTER — Ambulatory Visit (HOSPITAL_COMMUNITY)
Admission: RE | Admit: 2017-12-30 | Discharge: 2017-12-30 | Disposition: A | Discharge status: Home / Self Care | Payer: Medicare Other | Source: Ambulatory Visit | Attending: General Surgery | Admitting: General Surgery

## 2017-12-30 ENCOUNTER — Ambulatory Visit: Payer: Medicare Other | Admitting: Oncology

## 2017-12-30 ENCOUNTER — Encounter (HOSPITAL_COMMUNITY): Payer: Self-pay

## 2017-12-30 ENCOUNTER — Ambulatory Visit (HOSPITAL_COMMUNITY): Payer: Medicare Other | Admitting: Certified Registered"

## 2017-12-30 ENCOUNTER — Observation Stay (HOSPITAL_COMMUNITY)
Admission: RE | Admit: 2017-12-30 | Discharge: 2017-12-31 | Disposition: A | Discharge status: Home / Self Care | Payer: Medicare Other | Source: Ambulatory Visit | Attending: General Surgery | Admitting: General Surgery

## 2017-12-30 ENCOUNTER — Encounter (HOSPITAL_COMMUNITY)
Admission: RE | Disposition: A | Discharge status: Home / Self Care | Payer: Self-pay | Source: Ambulatory Visit | Attending: General Surgery

## 2017-12-30 ENCOUNTER — Other Ambulatory Visit: Payer: Self-pay

## 2017-12-30 DIAGNOSIS — N6011 Diffuse cystic mastopathy of right breast: Secondary | ICD-10-CM | POA: Diagnosis not present

## 2017-12-30 DIAGNOSIS — Z79899 Other long term (current) drug therapy: Secondary | ICD-10-CM | POA: Diagnosis not present

## 2017-12-30 DIAGNOSIS — C50311 Malignant neoplasm of lower-inner quadrant of right female breast: Secondary | ICD-10-CM | POA: Diagnosis not present

## 2017-12-30 DIAGNOSIS — I10 Essential (primary) hypertension: Secondary | ICD-10-CM | POA: Insufficient documentation

## 2017-12-30 DIAGNOSIS — Z9981 Dependence on supplemental oxygen: Secondary | ICD-10-CM | POA: Insufficient documentation

## 2017-12-30 DIAGNOSIS — E0590 Thyrotoxicosis, unspecified without thyrotoxic crisis or storm: Secondary | ICD-10-CM | POA: Diagnosis not present

## 2017-12-30 DIAGNOSIS — N6081 Other benign mammary dysplasias of right breast: Secondary | ICD-10-CM | POA: Insufficient documentation

## 2017-12-30 DIAGNOSIS — C50911 Malignant neoplasm of unspecified site of right female breast: Secondary | ICD-10-CM | POA: Diagnosis present

## 2017-12-30 DIAGNOSIS — J439 Emphysema, unspecified: Secondary | ICD-10-CM | POA: Insufficient documentation

## 2017-12-30 DIAGNOSIS — Z888 Allergy status to other drugs, medicaments and biological substances status: Secondary | ICD-10-CM | POA: Diagnosis not present

## 2017-12-30 DIAGNOSIS — Z803 Family history of malignant neoplasm of breast: Secondary | ICD-10-CM | POA: Diagnosis not present

## 2017-12-30 DIAGNOSIS — L821 Other seborrheic keratosis: Secondary | ICD-10-CM | POA: Diagnosis not present

## 2017-12-30 DIAGNOSIS — Z9012 Acquired absence of left breast and nipple: Secondary | ICD-10-CM | POA: Insufficient documentation

## 2017-12-30 DIAGNOSIS — Z881 Allergy status to other antibiotic agents status: Secondary | ICD-10-CM | POA: Insufficient documentation

## 2017-12-30 DIAGNOSIS — D224 Melanocytic nevi of scalp and neck: Secondary | ICD-10-CM | POA: Diagnosis not present

## 2017-12-30 DIAGNOSIS — Z853 Personal history of malignant neoplasm of breast: Secondary | ICD-10-CM | POA: Diagnosis not present

## 2017-12-30 DIAGNOSIS — Z87891 Personal history of nicotine dependence: Secondary | ICD-10-CM | POA: Insufficient documentation

## 2017-12-30 DIAGNOSIS — Z880 Allergy status to penicillin: Secondary | ICD-10-CM | POA: Diagnosis not present

## 2017-12-30 DIAGNOSIS — Z91030 Bee allergy status: Secondary | ICD-10-CM | POA: Insufficient documentation

## 2017-12-30 DIAGNOSIS — M810 Age-related osteoporosis without current pathological fracture: Secondary | ICD-10-CM | POA: Diagnosis not present

## 2017-12-30 DIAGNOSIS — Z885 Allergy status to narcotic agent status: Secondary | ICD-10-CM | POA: Diagnosis not present

## 2017-12-30 DIAGNOSIS — Z170 Estrogen receptor positive status [ER+]: Secondary | ICD-10-CM

## 2017-12-30 HISTORY — PX: MASTECTOMY W/ SENTINEL NODE BIOPSY: SHX2001

## 2017-12-30 SURGERY — MASTECTOMY WITH SENTINEL LYMPH NODE BIOPSY
Anesthesia: General | Site: Breast | Laterality: Right

## 2017-12-30 MED ORDER — MIDAZOLAM HCL 2 MG/2ML IJ SOLN
INTRAMUSCULAR | Status: AC
  Administered 2017-12-30: 2 mg via INTRAVENOUS
  Filled 2017-12-30: qty 2

## 2017-12-30 MED ORDER — LEVOTHYROXINE SODIUM 75 MCG PO TABS
75.0000 ug | ORAL_TABLET | Freq: Every day | ORAL | Status: DC
  Administered 2017-12-31: 75 ug via ORAL
  Filled 2017-12-30: qty 1

## 2017-12-30 MED ORDER — PROPOFOL 10 MG/ML IV BOLUS
INTRAVENOUS | Status: AC
  Filled 2017-12-30: qty 20

## 2017-12-30 MED ORDER — ENOXAPARIN SODIUM 40 MG/0.4ML ~~LOC~~ SOLN: 40.0000 mg | SUBCUTANEOUS | Status: DC

## 2017-12-30 MED ORDER — AZITHROMYCIN 250 MG PO TABS
250.0000 mg | ORAL_TABLET | ORAL | Status: DC
  Administered 2017-12-31: 250 mg via ORAL
  Filled 2017-12-30: qty 1

## 2017-12-30 MED ORDER — LOSARTAN POTASSIUM 50 MG PO TABS
50.0000 mg | ORAL_TABLET | Freq: Every day | ORAL | Status: DC
  Administered 2017-12-31: 50 mg via ORAL
  Filled 2017-12-30: qty 1

## 2017-12-30 MED ORDER — FENTANYL CITRATE (PF) 100 MCG/2ML IJ SOLN
INTRAMUSCULAR | Status: AC
  Administered 2017-12-30: 100 ug via INTRAVENOUS
  Filled 2017-12-30: qty 2

## 2017-12-30 MED ORDER — CIPROFLOXACIN IN D5W 400 MG/200ML IV SOLN
INTRAVENOUS | Status: AC
  Filled 2017-12-30: qty 200

## 2017-12-30 MED ORDER — ACETAMINOPHEN 500 MG PO TABS
1000.0000 mg | ORAL_TABLET | ORAL | Status: AC
  Administered 2017-12-30: 1000 mg via ORAL

## 2017-12-30 MED ORDER — ACETAMINOPHEN 500 MG PO TABS
ORAL_TABLET | ORAL | Status: AC
  Administered 2017-12-30: 1000 mg via ORAL
  Filled 2017-12-30: qty 2

## 2017-12-30 MED ORDER — ONDANSETRON 4 MG PO TBDP: 4.0000 mg | ORAL_TABLET | Freq: Four times a day (QID) | ORAL | Status: DC | PRN

## 2017-12-30 MED ORDER — METHYLENE BLUE 0.5 % INJ SOLN
INTRAVENOUS | Status: AC
  Filled 2017-12-30: qty 10

## 2017-12-30 MED ORDER — OXYCODONE HCL 5 MG PO TABS: 5.0000 mg | ORAL_TABLET | Freq: Four times a day (QID) | ORAL | Status: DC | PRN

## 2017-12-30 MED ORDER — SODIUM CHLORIDE 0.9 % IV SOLN
INTRAVENOUS | Status: DC
  Administered 2017-12-30: 1 mL via INTRAVENOUS

## 2017-12-30 MED ORDER — METOCLOPRAMIDE HCL 5 MG/ML IJ SOLN: 10.0000 mg | Freq: Once | INTRAMUSCULAR | Status: DC | PRN

## 2017-12-30 MED ORDER — ONDANSETRON HCL 4 MG/2ML IJ SOLN: 4.0000 mg | Freq: Four times a day (QID) | INTRAMUSCULAR | Status: DC | PRN

## 2017-12-30 MED ORDER — HYDROCHLOROTHIAZIDE 12.5 MG PO CAPS
12.5000 mg | ORAL_CAPSULE | Freq: Every day | ORAL | Status: DC
  Administered 2017-12-31: 12.5 mg via ORAL
  Filled 2017-12-30: qty 1

## 2017-12-30 MED ORDER — DEXAMETHASONE SODIUM PHOSPHATE 10 MG/ML IJ SOLN
INTRAMUSCULAR | Status: DC | PRN
  Administered 2017-12-30: 5 mg via INTRAVENOUS

## 2017-12-30 MED ORDER — GABAPENTIN 100 MG PO CAPS
100.0000 mg | ORAL_CAPSULE | ORAL | Status: AC
  Administered 2017-12-30: 100 mg via ORAL
  Filled 2017-12-30: qty 1

## 2017-12-30 MED ORDER — LOSARTAN POTASSIUM-HCTZ 50-12.5 MG PO TABS: 1.0000 | ORAL_TABLET | Freq: Every day | ORAL | Status: DC

## 2017-12-30 MED ORDER — CIPROFLOXACIN IN D5W 400 MG/200ML IV SOLN
400.0000 mg | INTRAVENOUS | Status: AC
  Administered 2017-12-30: 400 mg via INTRAVENOUS

## 2017-12-30 MED ORDER — SIMETHICONE 80 MG PO CHEW: 40.0000 mg | CHEWABLE_TABLET | Freq: Four times a day (QID) | ORAL | Status: DC | PRN

## 2017-12-30 MED ORDER — ONDANSETRON HCL 4 MG/2ML IJ SOLN
INTRAMUSCULAR | Status: DC | PRN
  Administered 2017-12-30: 4 mg via INTRAVENOUS

## 2017-12-30 MED ORDER — LACTATED RINGERS IV SOLN
INTRAVENOUS | Status: DC
  Administered 2017-12-30: 13:00:00 via INTRAVENOUS

## 2017-12-30 MED ORDER — MORPHINE SULFATE (PF) 2 MG/ML IV SOLN: 1.0000 mg | INTRAVENOUS | Status: DC | PRN

## 2017-12-30 MED ORDER — PREDNISONE 5 MG PO TABS
5.0000 mg | ORAL_TABLET | Freq: Every day | ORAL | Status: DC
  Administered 2017-12-30: 5 mg via ORAL
  Filled 2017-12-30: qty 1

## 2017-12-30 MED ORDER — ALBUTEROL SULFATE (2.5 MG/3ML) 0.083% IN NEBU: 2.5000 mg | INHALATION_SOLUTION | RESPIRATORY_TRACT | Status: DC | PRN

## 2017-12-30 MED ORDER — UMECLIDINIUM-VILANTEROL 62.5-25 MCG/INH IN AEPB
1.0000 | INHALATION_SPRAY | Freq: Every day | RESPIRATORY_TRACT | Status: DC
  Administered 2017-12-31: 1 via RESPIRATORY_TRACT
  Filled 2017-12-30: qty 14

## 2017-12-30 MED ORDER — METHOCARBAMOL 500 MG PO TABS: 500.0000 mg | ORAL_TABLET | Freq: Four times a day (QID) | ORAL | Status: DC | PRN

## 2017-12-30 MED ORDER — ACETAMINOPHEN 500 MG PO TABS
1000.0000 mg | ORAL_TABLET | Freq: Four times a day (QID) | ORAL | Status: DC
  Administered 2017-12-30 – 2017-12-31 (×4): 1000 mg via ORAL
  Filled 2017-12-30 (×4): qty 2

## 2017-12-30 MED ORDER — DEXAMETHASONE SODIUM PHOSPHATE 10 MG/ML IJ SOLN
INTRAMUSCULAR | Status: AC
  Filled 2017-12-30: qty 1

## 2017-12-30 MED ORDER — LIDOCAINE 2% (20 MG/ML) 5 ML SYRINGE
INTRAMUSCULAR | Status: DC | PRN
  Administered 2017-12-30: 50 mg via INTRAVENOUS

## 2017-12-30 MED ORDER — 0.9 % SODIUM CHLORIDE (POUR BTL) OPTIME
TOPICAL | Status: DC | PRN
  Administered 2017-12-30: 1000 mL

## 2017-12-30 MED ORDER — PROPOFOL 10 MG/ML IV BOLUS
INTRAVENOUS | Status: DC | PRN
  Administered 2017-12-30: 120 mg via INTRAVENOUS

## 2017-12-30 MED ORDER — GABAPENTIN 300 MG PO CAPS
ORAL_CAPSULE | ORAL | Status: AC
  Filled 2017-12-30: qty 1

## 2017-12-30 MED ORDER — FENTANYL CITRATE (PF) 100 MCG/2ML IJ SOLN: 25.0000 ug | INTRAMUSCULAR | Status: DC | PRN

## 2017-12-30 MED ORDER — FENTANYL CITRATE (PF) 100 MCG/2ML IJ SOLN
100.0000 ug | Freq: Once | INTRAMUSCULAR | Status: AC
  Administered 2017-12-30: 100 ug via INTRAVENOUS

## 2017-12-30 MED ORDER — IPRATROPIUM-ALBUTEROL 0.5-2.5 (3) MG/3ML IN SOLN: 3.0000 mL | Freq: Four times a day (QID) | RESPIRATORY_TRACT | Status: DC | PRN

## 2017-12-30 MED ORDER — LIDOCAINE 2% (20 MG/ML) 5 ML SYRINGE
INTRAMUSCULAR | Status: AC
  Filled 2017-12-30: qty 5

## 2017-12-30 MED ORDER — ONDANSETRON HCL 4 MG/2ML IJ SOLN
INTRAMUSCULAR | Status: AC
  Filled 2017-12-30: qty 2

## 2017-12-30 MED ORDER — FENTANYL CITRATE (PF) 250 MCG/5ML IJ SOLN
INTRAMUSCULAR | Status: AC
  Filled 2017-12-30: qty 5

## 2017-12-30 MED ORDER — BUPIVACAINE-EPINEPHRINE (PF) 0.5% -1:200000 IJ SOLN
INTRAMUSCULAR | Status: DC | PRN
  Administered 2017-12-30: 30 mL via PERINEURAL

## 2017-12-30 MED ORDER — ENSURE PRE-SURGERY PO LIQD
296.0000 mL | Freq: Once | ORAL | Status: DC
  Filled 2017-12-30: qty 296

## 2017-12-30 MED ORDER — MIDAZOLAM HCL 2 MG/2ML IJ SOLN
2.0000 mg | Freq: Once | INTRAMUSCULAR | Status: AC
  Administered 2017-12-30: 2 mg via INTRAVENOUS

## 2017-12-30 MED ORDER — FENTANYL CITRATE (PF) 100 MCG/2ML IJ SOLN
INTRAMUSCULAR | Status: DC | PRN
  Administered 2017-12-30 (×4): 50 ug via INTRAVENOUS

## 2017-12-30 MED ORDER — TECHNETIUM TC 99M SULFUR COLLOID FILTERED
1.0000 | Freq: Once | INTRAVENOUS | Status: AC | PRN
  Administered 2017-12-30: 1 via INTRADERMAL

## 2017-12-30 MED ORDER — MEPERIDINE HCL 50 MG/ML IJ SOLN: 6.2500 mg | INTRAMUSCULAR | Status: DC | PRN

## 2017-12-30 SURGICAL SUPPLY — 58 items
ADH SKN CLS APL DERMABOND .7 (GAUZE/BANDAGES/DRESSINGS) ×2
APPLIER CLIP 9.375 MED OPEN (MISCELLANEOUS)
APR CLP MED 9.3 20 MLT OPN (MISCELLANEOUS)
BINDER BREAST LRG (GAUZE/BANDAGES/DRESSINGS) IMPLANT
BINDER BREAST XLRG (GAUZE/BANDAGES/DRESSINGS) IMPLANT
BIOPATCH RED 1 DISK 7.0 (GAUZE/BANDAGES/DRESSINGS) ×1 IMPLANT
CANISTER SUCT 3000ML PPV (MISCELLANEOUS) ×2 IMPLANT
CHLORAPREP W/TINT 26ML (MISCELLANEOUS) ×2 IMPLANT
CLIP APPLIE 9.375 MED OPEN (MISCELLANEOUS) IMPLANT
CONT SPEC 4OZ CLIKSEAL STRL BL (MISCELLANEOUS) ×2 IMPLANT
COVER PROBE W GEL 5X96 (DRAPES) ×2 IMPLANT
COVER SURGICAL LIGHT HANDLE (MISCELLANEOUS) ×2 IMPLANT
COVER WAND RF STERILE (DRAPES) ×2 IMPLANT
DERMABOND ADVANCED (GAUZE/BANDAGES/DRESSINGS) ×2
DERMABOND ADVANCED .7 DNX12 (GAUZE/BANDAGES/DRESSINGS) ×1 IMPLANT
DRAIN CHANNEL 19F RND (DRAIN) ×2 IMPLANT
DRAPE LAPAROSCOPIC ABDOMINAL (DRAPES) ×2 IMPLANT
ELECT BLADE 4.0 EZ CLEAN MEGAD (MISCELLANEOUS) ×2
ELECT CAUTERY BLADE 6.4 (BLADE) ×2 IMPLANT
ELECT REM PT RETURN 9FT ADLT (ELECTROSURGICAL) ×2
ELECTRODE BLDE 4.0 EZ CLN MEGD (MISCELLANEOUS) ×1 IMPLANT
ELECTRODE REM PT RTRN 9FT ADLT (ELECTROSURGICAL) ×1 IMPLANT
EVACUATOR SILICONE 100CC (DRAIN) ×2 IMPLANT
GAUZE SPONGE 4X4 12PLY STRL (GAUZE/BANDAGES/DRESSINGS) ×2 IMPLANT
GLOVE BIO SURGEON STRL SZ7 (GLOVE) ×2 IMPLANT
GLOVE BIOGEL PI IND STRL 7.5 (GLOVE) ×1 IMPLANT
GLOVE BIOGEL PI INDICATOR 7.5 (GLOVE) ×1
GOWN STRL REUS W/ TWL LRG LVL3 (GOWN DISPOSABLE) ×3 IMPLANT
GOWN STRL REUS W/TWL LRG LVL3 (GOWN DISPOSABLE) ×6
HEMOSTAT ARISTA ABSORB 3G PWDR (MISCELLANEOUS) ×1 IMPLANT
KIT BASIN OR (CUSTOM PROCEDURE TRAY) ×2 IMPLANT
KIT TURNOVER KIT B (KITS) ×2 IMPLANT
LIGHT WAVEGUIDE WIDE FLAT (MISCELLANEOUS) ×1 IMPLANT
MARKER SKIN DUAL TIP RULER LAB (MISCELLANEOUS) ×2 IMPLANT
NDL 18GX1X1/2 (RX/OR ONLY) (NEEDLE) IMPLANT
NDL FILTER BLUNT 18X1 1/2 (NEEDLE) IMPLANT
NDL HYPO 25GX1X1/2 BEV (NEEDLE) IMPLANT
NEEDLE 18GX1X1/2 (RX/OR ONLY) (NEEDLE) IMPLANT
NEEDLE FILTER BLUNT 18X 1/2SAF (NEEDLE)
NEEDLE FILTER BLUNT 18X1 1/2 (NEEDLE) IMPLANT
NEEDLE HYPO 25GX1X1/2 BEV (NEEDLE) IMPLANT
NS IRRIG 1000ML POUR BTL (IV SOLUTION) ×2 IMPLANT
PACK GENERAL/GYN (CUSTOM PROCEDURE TRAY) ×2 IMPLANT
PAD ARMBOARD 7.5X6 YLW CONV (MISCELLANEOUS) ×2 IMPLANT
PIN SAFETY STERILE (MISCELLANEOUS) ×2 IMPLANT
SPECIMEN JAR X LARGE (MISCELLANEOUS) ×2 IMPLANT
STAPLER VISISTAT 35W (STAPLE) ×2 IMPLANT
STRIP CLOSURE SKIN 1/2X4 (GAUZE/BANDAGES/DRESSINGS) ×2 IMPLANT
SUT ETHILON 2 0 FS 18 (SUTURE) ×2 IMPLANT
SUT MON AB 4-0 PC3 18 (SUTURE) ×2 IMPLANT
SUT SILK 2 0 SH (SUTURE) IMPLANT
SUT VIC AB 3-0 54X BRD REEL (SUTURE) ×1 IMPLANT
SUT VIC AB 3-0 BRD 54 (SUTURE) ×2
SUT VIC AB 3-0 SH 18 (SUTURE) ×2 IMPLANT
SUT VIC AB 3-0 SH 8-18 (SUTURE) ×4 IMPLANT
SYR CONTROL 10ML LL (SYRINGE) IMPLANT
TOWEL OR 17X24 6PK STRL BLUE (TOWEL DISPOSABLE) ×2 IMPLANT
TOWEL OR 17X26 10 PK STRL BLUE (TOWEL DISPOSABLE) ×2 IMPLANT

## 2017-12-30 NOTE — Plan of Care (Signed)
?  Problem: Elimination: ?Goal: Will not experience complications related to bowel motility ?Outcome: Progressing ?  ?Problem: Pain Managment: ?Goal: General experience of comfort will improve ?Outcome: Progressing ?  ?Problem: Safety: ?Goal: Ability to remain free from injury will improve ?Outcome: Progressing ?  ?

## 2017-12-30 NOTE — Anesthesia Procedure Notes (Signed)
Procedure Name: LMA Insertion Date/Time: 12/30/2017 1:41 PM Performed by: Moshe Salisbury, CRNA Pre-anesthesia Checklist: Patient identified, Emergency Drugs available, Suction available and Patient being monitored Patient Re-evaluated:Patient Re-evaluated prior to induction Oxygen Delivery Method: Circle System Utilized Preoxygenation: Pre-oxygenation with 100% oxygen Induction Type: IV induction Ventilation: Mask ventilation without difficulty LMA: LMA inserted LMA Size: 4.0 Number of attempts: 1 Placement Confirmation: positive ETCO2 Tube secured with: Tape Dental Injury: Teeth and Oropharynx as per pre-operative assessment

## 2017-12-30 NOTE — Discharge Instructions (Signed)
[facility] [place] [phone]   POST OP INSTRUCTIONS Take 400 mg of ibuprofen every 8 hours or 650 mg tylenol every 6 hours for next 72 hours then as needed. Use ice several times daily also. Always review your discharge instruction sheet given to you by the facility where your surgery was performed. IF YOU HAVE DISABILITY OR FAMILY LEAVE FORMS, YOU MUST BRING THEM TO THE OFFICE FOR PROCESSING.   DO NOT GIVE THEM TO YOUR DOCTOR. A prescription for pain medication may be given to you upon discharge.  Take your pain medication as prescribed, if needed.  If narcotic pain medicine is not needed, then you may take acetaminophen (Tylenol), naprosyn (Alleve) or ibuprofen (Advil) as needed. 1. Take your usually prescribed medications unless otherwise directed. 2. If you need a refill on your pain medication, please contact your pharmacy.  They will contact our office to request authorization.  Prescriptions will not be filled after 5pm or on week-ends. 3. You should follow a light diet the first few days after arrival home, such as soup and crackers, etc.  Resume your normal diet the day after surgery. 4. Most patients will experience some swelling and bruising on the chest and underarm.  Ice packs will help.  Swelling and bruising can take several days to resolve. Wear the binder day and night until you return to the office.  5. It is common to experience some constipation if taking pain medication after surgery.  Increasing fluid intake and taking a stool softener (such as Colace) will usually help or prevent this problem from occurring.  A mild laxative (Milk of Magnesia or Miralax) should be taken according to package instructions if there are no bowel movements after 48 hours. 6. Unless discharge instructions indicate otherwise, leave your bandage dry and in place until your next appointment in 3-5 days.  You may take a limited sponge bath.  No tube baths or showers until the drains are  removed.  You may have steri-strips (small skin tapes) in place directly over the incision.  These strips should be left on the skin for 7-10 days. If you have glue it will come off in next couple week.  Any sutures will be removed at an office visit 7. DRAINS:  If you have drains in place, it is important to keep a list of the amount of drainage produced each day in your drains.  Before leaving the hospital, you should be instructed on drain care.  Call our office if you have any questions about your drains. I will remove your drains when they put out less than 30 cc or ml for 2 consecutive days. 8. ACTIVITIES:  You may resume regular (light) daily activities beginning the next day--such as daily self-care, walking, climbing stairs--gradually increasing activities as tolerated.  You may have sexual intercourse when it is comfortable.  Refrain from any heavy lifting or straining until approved by your doctor. a. You may drive when you are no longer taking prescription pain medication, you can comfortably wear a seatbelt, and you can safely maneuver your car and apply brakes. b. RETURN TO WORK:  __________________________________________________________ 9. You should see your doctor in the office for a follow-up appointment approximately 3-5 days after your surgery.  Your doctor's nurse will typically make your follow-up appointment when she calls you with your pathology report.  Expect your pathology report 3-4business days after surgery. 10. OTHER INSTRUCTIONS: ______________________________________________________________________________________________ ____________________________________________________________________________________________ WHEN TO CALL YOUR DR Marymargaret Kirker: 1. Fever over 101.0 2.   Nausea and/or vomiting 3. Extreme swelling or bruising 4. Continued bleeding from incision. 5. Increased pain, redness, or drainage from the incision. The clinic staff is available to answer your questions  during regular business hours.  Please don't hesitate to call and ask to speak to one of the nurses for clinical concerns.  If you have a medical emergency, go to the nearest emergency room or call 911.  A surgeon from [facility] is always on call at the hospital. [address], [address], [place], [place]  [address] ? [address] [address], [place], [place]   [address] [phone] ? [phone] ? FAX [phone] Web site: [email or web address]  

## 2017-12-30 NOTE — Anesthesia Preprocedure Evaluation (Signed)
Anesthesia Evaluation  Patient identified by MRN, date of birth, ID band Patient awake    Reviewed: Allergy & Precautions, NPO status , Patient's Chart, lab work & pertinent test results  History of Anesthesia Complications (+) PONV and history of anesthetic complications  Airway Mallampati: II  TM Distance: >3 FB Neck ROM: Full    Dental no notable dental hx.    Pulmonary COPD,  COPD inhaler, former smoker,    Pulmonary exam normal breath sounds clear to auscultation + decreased breath sounds      Cardiovascular hypertension, Pt. on medications Normal cardiovascular exam Rhythm:Regular Rate:Normal     Neuro/Psych negative neurological ROS  negative psych ROS   GI/Hepatic hiatal hernia,   Endo/Other  Hyperthyroidism Right Breast Ca Osteoporosis  Renal/GU   negative genitourinary   Musculoskeletal  (+) Arthritis , Osteoarthritis,    Abdominal   Peds  Hematology   Anesthesia Other Findings   Reproductive/Obstetrics                             Anesthesia Physical Anesthesia Plan  ASA: III  Anesthesia Plan: General   Post-op Pain Management:  Regional for Post-op pain   Induction: Intravenous  PONV Risk Score and Plan: 4 or greater and Ondansetron, Treatment may vary due to age or medical condition and Dexamethasone  Airway Management Planned: LMA  Additional Equipment:   Intra-op Plan:   Post-operative Plan: Extubation in OR  Informed Consent: I have reviewed the patients History and Physical, chart, labs and discussed the procedure including the risks, benefits and alternatives for the proposed anesthesia with the patient or authorized representative who has indicated his/her understanding and acceptance.   Dental advisory given  Plan Discussed with: CRNA and Surgeon  Anesthesia Plan Comments:         Anesthesia Quick Evaluation

## 2017-12-30 NOTE — Interval H&P Note (Signed)
History and Physical Interval Note:  59/06/5857 29:24 PM  Elaine [name]  has presented today for surgery, with the diagnosis of RIGHT BREAST CANCER  The various methods of treatment have been discussed with the patient and family. After consideration of risks, benefits and other options for treatment, the patient has consented to  Procedure(s): MASTECTOMY WITH SENTINEL LYMPH NODE BIOPSY (Right) as a surgical intervention .  The patient's history has been reviewed, patient examined, no change in status, stable for surgery.  I have reviewed the patient's chart and labs.  Questions were answered to the patient's satisfaction.     Rolm Bookbinder

## 2017-12-30 NOTE — Anesthesia Postprocedure Evaluation (Signed)
Anesthesia Post Note  Patient: Elaine [name]  Procedure(s) Performed: MASTECTOMY WITH SENTINEL LYMPH NODE BIOPSY (Right Breast)     Patient location during evaluation: PACU Anesthesia Type: General Level of consciousness: awake and alert and oriented Pain management: pain level controlled Vital Signs Assessment: post-procedure vital signs reviewed and stable Respiratory status: spontaneous breathing, nonlabored ventilation, respiratory function stable and patient connected to nasal cannula oxygen Cardiovascular status: blood pressure returned to baseline and stable Postop Assessment: no apparent nausea or vomiting Anesthetic complications: no    Last Vitals:  Vitals:   12/30/17 1510 12/30/17 1524  BP: (!) 187/90 (!) 178/77  Pulse: 72 69  Resp: 19 20  Temp: (!) 36.1 C   SpO2: 99% 96%    Last Pain:  Vitals:   12/30/17 1510  TempSrc:   PainSc: 0-No pain                 Mertis Mosher A.

## 2017-12-30 NOTE — Op Note (Signed)
Preoperative diagnosis: clinical stage I right breast cancer Left neck nevi Postoperative diagnosis: same as above Procedure 1. Excision of 1 cm left neck nevus 2. Excision of 5 mm left neck posterior nevus 3. Right total mastectomy 4. Right deep axillary sentinel node biopsy Surgeon: Dr Serita Grammes Anesthesia general with pec block EBL: 50 cc Drains 19 Fr Blake drain  Specimens  1. Right mastectomy marked short superior, long lateral , double deep 2. Right axillary sentinel node with count of 255 Sponge and needle count correct times two dispo to recovery stable.  Indications: This is a 72 year old female with significant COPD was had a prior left breast cancer presents with a multicentric right breast invasive lobular cancer.  She has been evaluated by her pulmonologist and maximized.  We discussed a mastectomy with a sentinel lymph node biopsy.  She also has 2 nevi on her left neck she would like excised.  Procedure: After informed consent was obtained the patient first underwent injection of technetium in the standard periareolar fashion.  She underwent a pectoral block.  SCDs were in place.  Antibiotics were given.  She was placed under general anesthesia with an LMA.  She was prepped and draped in the standard sterile surgical fashion.  Surgical timeout was then performed.  I used a 15 blade and removed an anterior and posterior left neck nevus.  These measured 1 cm and 5 mm.  Hemostasis was observed.  I closed these with Dermabond.  I performed the right mastectomy in a similar fashion as she had had her previous left mastectomy years ago.  I made a large elliptical incision to include the nipple areolar complex.  I then created flaps to the clavicle, parasternal region, latissimus, and inframammary fold.  The breast and the pectoralis fascia was then removed from the pectoralis muscle.  The sentinel node was actually in the lateral portion of this.  There is no other background  radioactivity present.  Hemostasis was observed.  I did remove some additional lateral tissue to try to make this flat.  I placed a 12 [place] Blake drain.  I closed this with 3-0 Vicryl and 4-0 Monocryl.  Glue and Steri-Strips were applied.  A binder was placed.  She tolerated this well was extubated and transferred to recovery stable.

## 2017-12-30 NOTE — Progress Notes (Signed)
Pt new admit from PACU s/p right breast mastectomy with sentinel lymph node biopsy, alert and oriented, room air , SCD, no complain of pain at this time, due to void.

## 2017-12-30 NOTE — Transfer of Care (Signed)
Immediate Anesthesia Transfer of Care Note  Patient: Elaine [name]  Procedure(s) Performed: MASTECTOMY WITH SENTINEL LYMPH NODE BIOPSY (Right Breast)  Patient Location: PACU  Anesthesia Type:GA combined with regional for post-op pain  Level of Consciousness: awake, oriented and patient cooperative  Airway & Oxygen Therapy: Patient Spontanous Breathing and Patient connected to nasal cannula oxygen  Post-op Assessment: Report given to RN, Post -op Vital signs reviewed and stable and Patient moving all extremities  Post vital signs: Reviewed and stable  Last Vitals:  Vitals Value Taken Time  BP 187/90 12/30/2017  3:08 PM  Temp    Pulse 70 12/30/2017  3:11 PM  Resp 21 12/30/2017  3:11 PM  SpO2 99 % 12/30/2017  3:11 PM  Vitals shown include unvalidated device data.  Last Pain:  Vitals:   12/30/17 1320  TempSrc:   PainSc: 0-No pain         Complications: No apparent anesthesia complications

## 2017-12-30 NOTE — Anesthesia Procedure Notes (Signed)
Anesthesia Regional Block: Pectoralis block   Pre-Anesthetic Checklist: ,, timeout performed, Correct Patient, Correct Site, Correct Laterality, Correct Procedure, Correct Position, site marked, Risks and benefits discussed,  Surgical consent,  Pre-op evaluation,  At surgeon's request and post-op pain management  Laterality: Right  Prep: chloraprep       Needles:  Injection technique: Single-shot  Needle Type: Echogenic Stimulator Needle     Needle Length: 9cm  Needle Gauge: 21   Needle insertion depth: 6 cm   Additional Needles:   Procedures:,,,, ultrasound used (permanent image in chart),,,,  Narrative:  Start time: 12/30/2017 1:10 PM End time: 12/30/2017 1:14 PM Anesthesiologist: Josephine Igo, MD  Additional Notes: Timeout performed. Patient sedated. Relevant anatomy ID'd using [place]. Incremental 2-2ml injection of LA with frequent aspiration. Patient tolerated procedure well.        Right Pectoralis Block

## 2017-12-30 NOTE — H&P (Signed)
Elaine [name] is an 72 y.o. female.   Chief Complaint: breast cancer  HPI:  63 yof I saw previously for new right breast cancer. she has history of left mastectomy in mid 1990s treated with surgery and antiestrogens. she has fh in maternal and paternal cousins. she has done well until recently had mm that showed right breast changes US showed a 1.1x0.9x0.8 cm mass and axillary [place] negative. 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 she also has two nevi on left neck she would like removed. she has copd and has been on oxygen recently and follows up with Dr Koleen Nimrod in [place]. she then underwent mri: IMPRESSION: 1. The biopsy proven malignancy in the lower inner right breast measures 1.5 x 1.1 x 1.1 cm. 2. Non mass enhancement in the upper inner quadrant of the right breast spans 4.5 x 2.7 x 2.3 cm. 3. No adenopathy. 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 the clips are 7.5 cm apart. she returns to discuss options   Past Medical History:  Diagnosis Date  . Breast cancer ([place]) 1995   left   . Cataract   . Complication of anesthesia   . Emphysema of lung ([place])   . Family history of breast cancer   . Hiatal hernia 1992  . Hypertension   . Hyperthyroidism   . Osteoporosis   . PONV (postoperative nausea and vomiting)     Past Surgical History:  Procedure Laterality Date  . MASTECTOMY Left 1995  . TOTAL ABDOMINAL HYSTERECTOMY  06/22/1993    Family History  Problem Relation Age of Onset  . Breast cancer Cousin 63  . Breast cancer Cousin        20's/30's  . Breast cancer Cousin        3 of [id number] (mom's cousins) had breast cancer 70+   Social History:  reports that she has quit smoking. She has never used smokeless tobacco. She reports that she drinks alcohol. She reports that she does not use drugs.  Allergies:  Allergies  Allergen Reactions  . Bee Venom Other (See Comments)    Unknown  .  Codeine Nausea Only  . Macrobid [Nitrofurantoin Macrocrystal] Other (See Comments)    Unknown  . Morphine And Related Other (See Comments)    Unknown  . Penicillins Other (See Comments)    Unknown Has patient had a PCN reaction causing immediate rash, facial/tongue/throat swelling, SOB or lightheadedness with hypotension: Unknown Has patient had a PCN reaction causing severe rash involving mucus membranes or skin necrosis: Unknown Has patient had a PCN reaction that required hospitalization: Unknown Has patient had a PCN reaction occurring within the last 10 years: No If all of the above answers are "NO", then may proceed with Cephalosporin use.   . Tyloxapol Nausea Only and Other (See Comments)    Extreme Drugged feeling, Doesn't affect pain    Medications Prior to Admission  Medication Sig Dispense Refill  . azithromycin (ZITHROMAX) 250 MG tablet Take 250 mg by mouth every Monday, Wednesday, and Friday.     Marland Kitchen ipratropium-albuterol (DUONEB) 0.5-2.5 (3) MG/3ML SOLN Inhale 3 mLs into the lungs See admin instructions. Use 1 vial via nebulizer daily. May use additional vial if needed at bedtime for shortness of breath.  3  . losartan-hydrochlorothiazide (HYZAAR) 50-12.5 MG tablet Take 1 tablet by mouth daily.  1  . OXYGEN Inhale 2 L into the lungs  continuous.    . predniSONE (DELTASONE) 5 MG tablet Take 5 mg by mouth at bedtime.     Marland Kitchen SYNTHROID 75 MCG tablet Take 75 mcg by mouth daily before breakfast.   1  . umeclidinium-vilanterol (ANORO ELLIPTA) 62.5-25 MCG/INH AEPB Inhale 1 puff into the lungs daily.    . VENTOLIN HFA 108 (90 Base) MCG/ACT inhaler Inhale 2 puffs into the lungs every 4 (four) hours as needed for wheezing or shortness of breath.   3  . denosumab (PROLIA) 60 MG/ML SOSY injection Inject 60 mg into the skin every 6 (six) months.       No results found for this or any previous visit (from the past 48 hour(s)). No results found.  ROS General Not Present- Appetite Loss,  Chills, Fatigue, Fever, Night Sweats, Weight Gain and Weight Loss. Skin Present- Change in Wart/Mole. Not Present- Dryness, Hives, Jaundice, New Lesions, Non-Healing Wounds, Rash and Ulcer. HEENT Present- Seasonal Allergies and Wears glasses/contact lenses. Not Present- Earache, Hearing Loss, Hoarseness, Nose Bleed, Oral Ulcers, Ringing in the Ears, Sinus Pain, Sore Throat, Visual Disturbances and Yellow Eyes. Respiratory Present- Difficulty Breathing. Not Present- Bloody sputum, Chronic Cough, Snoring and Wheezing. Breast Present- Breast Mass. Not Present- Breast Pain, Nipple Discharge and Skin Changes. Cardiovascular Present- Leg Cramps and Shortness of Breath. Not Present- Chest Pain, Difficulty Breathing Lying Down, Palpitations, Rapid Heart Rate and Swelling of Extremities. Gastrointestinal Not Present- Abdominal Pain, Bloating, Bloody Stool, Change in Bowel Habits, Chronic diarrhea, Constipation, Difficulty Swallowing, Excessive gas, Gets full quickly at meals, Hemorrhoids, Indigestion, Nausea, Rectal Pain and Vomiting. Female Genitourinary Not Present- Frequency, Nocturia, Painful Urination, Pelvic Pain and Urgency. Musculoskeletal Not Present- Back Pain, Joint Pain, Joint Stiffness, Muscle Pain, Muscle Weakness and Swelling of Extremities. Neurological Not Present- Decreased Memory, Fainting, Headaches, Numbness, Seizures, Tingling, Tremor, Trouble walking and Weakness. Psychiatric Not Present- Anxiety, Bipolar, Change in Sleep Pattern, Depression, Fearful and Frequent crying. Endocrine Not Present- Cold Intolerance, Excessive Hunger, Hair Changes, Heat Intolerance, Hot flashes and New Diabetes. Hematology Present- Easy Bruising. Not Present- Blood Thinners, Excessive bleeding, Gland problems, HIV and Persistent Infections. Blood pressure (!) 169/56, pulse 75, temperature 98.1 F (36.7 C), temperature source Oral, resp. rate 18, height 5\' 3"  (1.6 m), weight 62.6 kg, SpO2 99 %. Physical Exam   Physical Exam Rolm Bookbinder MD; 11/06/2017 4:10 PM) General Mental Status-Alert. Orientation-Oriented X3. Head and Neck Trachea-midline. Eye Sclera/Conjunctiva - Bilateral-No scleral icterus. Chest and Lung Exam Chest and lung exam reveals -quiet, even and easy respiratory effort with no use of accessory muscles and on auscultation, normal breath sounds, no adventitious sounds and normal vocal resonance. Breast Nipples Discharge - Right - None. Breast Lump-No Palpable Breast Mass. Cardiovascular Cardiovascular examination reveals -normal heart sounds, regular rate and rhythm with no murmurs. Lymphatic Head & Neck General Head & Neck Lymphatics: Bilateral - Description - Normal. Axillary General Axillary Region: Bilateral - Description - Normal. Note: no [place] adenopathy  Assessment/Plan BREAST CANCER OF LOWER-INNER QUADRANT OF RIGHT FEMALE BREAST (C50.311) Story: Right mastectomy and sn biopsy We discussed a sentinel lymph node biopsy as she does not appear to having lymph node involvement right now. We discussed the performance of that with injection of radioactive tracer. We discussed that there is a chance of having a positive node with a sentinel lymph node biopsy and we will await the permanent pathology to make any other first further decisions in terms of her treatment. One of these options might be to return to the operating  room to perform an axillary lymph node dissection. We discussed risk of lymphedema and shoulder dysfunction I think best choice at this point oncologically is a mastectomy. we discussed this and she understands as she has prior left mastectomy. other treatment options will be based on final path. will do at [facility] and remain overnight.    Rolm Bookbinder, MD 12/30/2017, 12:32 PM

## 2017-12-30 NOTE — Progress Notes (Signed)
Pt JP drain site was bleeding changed dressing, output of JP 20 ml, will continue to monitor.

## 2017-12-31 ENCOUNTER — Encounter (HOSPITAL_COMMUNITY): Payer: Self-pay | Admitting: General Surgery

## 2017-12-31 ENCOUNTER — Ambulatory Visit: Payer: Medicare Other | Admitting: Oncology

## 2017-12-31 DIAGNOSIS — C50311 Malignant neoplasm of lower-inner quadrant of right female breast: Secondary | ICD-10-CM | POA: Diagnosis not present

## 2017-12-31 LAB — CBC
HCT: 40.5 % (ref 36.0–46.0)
Hemoglobin: 13.8 g/dL (ref 12.0–15.0)
MCH: 33.4 pg (ref 26.0–34.0)
MCHC: 34.1 g/dL (ref 30.0–36.0)
MCV: 98.1 fL (ref 78.0–100.0)
Platelets: 200 10*3/uL (ref 150–400)
RBC: 4.13 MIL/uL (ref 3.87–5.11)
RDW: 12.1 % (ref 11.5–15.5)
WBC: 15.6 10*3/uL — ABNORMAL HIGH (ref 4.0–10.5)

## 2017-12-31 LAB — BASIC METABOLIC PANEL
Anion gap: 10 (ref 5–15)
BUN: 12 mg/dL (ref 8–23)
CO2: 20 mmol/L — ABNORMAL LOW (ref 22–32)
Calcium: 8.3 mg/dL — ABNORMAL LOW (ref 8.9–10.3)
Chloride: 107 mmol/L (ref 98–111)
Creatinine, Ser: 0.89 mg/dL (ref 0.44–1.00)
GFR calc Af Amer: >60 mL/min (ref >60)
GFR calc non Af Amer: >60 mL/min (ref >60)
Glucose, Bld: 117 mg/dL — ABNORMAL HIGH (ref 70–99)
Potassium: 4.2 mmol/L (ref 3.5–5.1)
Sodium: 137 mmol/L (ref 135–145)

## 2017-12-31 MED ORDER — METHOCARBAMOL 500 MG PO TABS: 500.0000 mg | ORAL_TABLET | Freq: Three times a day (TID) | ORAL | 0 refills | Status: DC | PRN

## 2017-12-31 NOTE — Progress Notes (Signed)
1 Day Post-Op   Subjective/Chief Complaint: Doing well, no issues with breathing, voiding, pain controlled   Objective: Vital signs in last 24 hours: Temp:  [97 F (36.1 C)-98.3 F (36.8 C)] 98.1 F (36.7 C) (10/02 0611) Pulse Rate:  [55-75] 59 (10/02 0611) Resp:  [16-21] 18 (10/02 0611) BP: (113-187)/(55-90) 115/63 (10/02 0611) SpO2:  [96 %-99 %] 97 % (10/02 2620) Weight:  [62.6 kg] 62.6 kg (10/01 1151) Last BM Date: 12/30/17  Intake/Output from previous day: 10/01 0701 - 10/02 0700 In: 3559 [P.O.:420; I.V.:1300] Out: 1653 [Urine:1500; Drains:78; Blood:75] Intake/Output this shift: Total I/O In: -  Out: 600 [Urine:600]  Resp: clear to auscultation bilaterally Incision/Wound:no hematoma, flaps viable, expected drain output  Lab Results:  Recent Labs    12/31/17 0437  WBC 15.6*  HGB 13.8  HCT 40.5  PLT 200   BMET Recent Labs    12/31/17 0437  NA 137  K 4.2  CL 107  CO2 20*  GLUCOSE 117*  BUN 12  CREATININE 0.89  CALCIUM 8.3*   PT/INR No results for input(s): LABPROT, INR in the last 72 hours. ABG No results for input(s): PHART, HCO3 in the last 72 hours.  Invalid input(s): PCO2, PO2  Studies/Results: Nm Sentinel Node Inj-no Rpt (breast)  Result Date: 12/30/2017 Sulfur colloid was injected by the nuclear medicine technologist for melanoma sentinel node.    Anti-infectives: Anti-infectives (From admission, onward)   Start     Dose/Rate Route Frequency Ordered Stop   12/31/17 1000  azithromycin (ZITHROMAX) tablet 250 mg     250 mg Oral Every M-W-F 12/30/17 1520     12/31/17 0600  ciprofloxacin (CIPRO) IVPB 400 mg     400 mg 200 mL/hr over 60 Minutes Intravenous On call to O.R. 12/30/17 1212 12/30/17 1345   12/30/17 1215  ciprofloxacin (CIPRO) 400 MG/200ML IVPB    Note to Pharmacy:  Tamsen Snider   : cabinet override      12/30/17 1215 12/30/17 1345      Assessment/Plan: POD 1 right mastectomy/sn biopsy Hopefully home later  today  Rolm Bookbinder 12/31/2017

## 2017-12-31 NOTE — Progress Notes (Signed)
Patient given discharge instructions. Patient verbalized understanding. Patient had personal oxygen tank from home for use and left unit in stable condition with all of belongings, wheeled by nursing staff.

## 2017-12-31 NOTE — Progress Notes (Signed)
Patient ambulated in the hallway with nursing staff. Patient tolerated walk well and maintained oxygen saturations at 97% on room air.

## 2018-01-02 NOTE — Discharge Summary (Signed)
Physician Discharge Summary  Patient ID: Elaine [name] MRN: [id number] DOB/AGE: 10-19-1945 72 y.o.  Admit date: 12/30/2017 Discharge date: 01/02/2018  Admission Diagnoses: Copd Breast cancer  Discharge Diagnoses:  Active Problems:   Breast cancer, right [facility])   Discharged Condition: good  Hospital Course: Underwent right mastectomy/sn biopsy for right breast cancer. Tolerated well.  She was monitored overnight due to comorbidities and did well. She was maintaining oxygen saturations with activity the following day and pain controlled. Drains as expected.   Consults: None  Significant Diagnostic Studies: none  Treatments: surgery: right mastectomy, sn biopsy    Disposition:    Allergies as of 12/31/2017      Reactions   Bee Venom Other (See Comments)   Unknown   Codeine Nausea Only   Macrobid [nitrofurantoin Macrocrystal] Other (See Comments)   Unknown   Morphine And Related Other (See Comments)   Unknown   Penicillins Other (See Comments)   Unknown Has patient had a PCN reaction causing immediate rash, facial/tongue/throat swelling, SOB or lightheadedness with hypotension: Unknown Has patient had a PCN reaction causing severe rash involving mucus membranes or skin necrosis: Unknown Has patient had a PCN reaction that required hospitalization: Unknown Has patient had a PCN reaction occurring within the last 10 years: No If all of the above answers are "NO", then may proceed with Cephalosporin use.   Tyloxapol Nausea Only, Other (See Comments)   Extreme Drugged feeling, Doesn't affect pain      Medication List    TAKE these medications   ANORO ELLIPTA 62.5-25 MCG/INH Aepb Generic drug:  umeclidinium-vilanterol Inhale 1 puff into the lungs daily.   azithromycin 250 MG tablet Commonly known as:  ZITHROMAX Take 250 mg by mouth every Monday, Wednesday, and Friday.   ipratropium-albuterol 0.5-2.5 (3) MG/3ML Soln Commonly known as:  DUONEB Inhale 3 mLs into the  lungs See admin instructions. Use 1 vial via nebulizer daily. May use additional vial if needed at bedtime for shortness of breath.   losartan-hydrochlorothiazide 50-12.5 MG tablet Commonly known as:  HYZAAR Take 1 tablet by mouth daily.   methocarbamol 500 MG tablet Commonly known as:  ROBAXIN Take 1 tablet (500 mg total) by mouth every 8 (eight) hours as needed for muscle spasms (use for muscle cramps/pain).   OXYGEN Inhale 2 L into the lungs continuous.   predniSONE 5 MG tablet Commonly known as:  DELTASONE Take 5 mg by mouth at bedtime.   PROLIA 60 MG/ML Sosy injection Generic drug:  denosumab Inject 60 mg into the skin every 6 (six) months.   SYNTHROID 75 MCG tablet Generic drug:  levothyroxine Take 75 mcg by mouth daily before breakfast.   VENTOLIN HFA 108 (90 Base) MCG/ACT inhaler Generic drug:  albuterol Inhale 2 puffs into the lungs every 4 (four) hours as needed for wheezing or shortness of breath.      Follow-up Information    Rolm Bookbinder, MD In 2 weeks.   Specialty:  General Surgery Contact information: [address] [place] [address] [phone]           Signed: Rolm Bookbinder 01/02/2018, 9:51 AM

## 2018-01-09 ENCOUNTER — Ambulatory Visit: Payer: Medicare Other | Admitting: Oncology

## 2018-01-09 ENCOUNTER — Telehealth: Payer: Self-pay | Admitting: *Deleted

## 2018-01-09 NOTE — Telephone Encounter (Signed)
Ordered oncotype per Dr. Magrinat.  Faxed requisition to pathology and confirmed receipt.  

## 2018-01-19 ENCOUNTER — Telehealth: Payer: Self-pay | Admitting: *Deleted

## 2018-01-19 NOTE — Telephone Encounter (Signed)
Received oncotype results of 18/5%.  Team notified.  Patient has appointment 10/22

## 2018-01-20 ENCOUNTER — Encounter: Payer: Self-pay | Admitting: Adult Health

## 2018-01-20 ENCOUNTER — Telehealth: Payer: Self-pay | Admitting: Adult Health

## 2018-01-20 ENCOUNTER — Inpatient Hospital Stay: Payer: Medicare Other | Attending: Oncology | Admitting: Adult Health

## 2018-01-20 VITALS — BP 122/64 | HR 73 | Temp 97.5°F | Resp 18 | Ht 63.0 in | Wt 143.9 lb

## 2018-01-20 DIAGNOSIS — M810 Age-related osteoporosis without current pathological fracture: Secondary | ICD-10-CM

## 2018-01-20 DIAGNOSIS — Z79811 Long term (current) use of aromatase inhibitors: Secondary | ICD-10-CM | POA: Diagnosis not present

## 2018-01-20 DIAGNOSIS — J449 Chronic obstructive pulmonary disease, unspecified: Secondary | ICD-10-CM

## 2018-01-20 DIAGNOSIS — Z9013 Acquired absence of bilateral breasts and nipples: Secondary | ICD-10-CM | POA: Diagnosis not present

## 2018-01-20 DIAGNOSIS — Z170 Estrogen receptor positive status [ER+]: Secondary | ICD-10-CM | POA: Diagnosis not present

## 2018-01-20 DIAGNOSIS — C50311 Malignant neoplasm of lower-inner quadrant of right female breast: Secondary | ICD-10-CM | POA: Diagnosis present

## 2018-01-20 DIAGNOSIS — Z87891 Personal history of nicotine dependence: Secondary | ICD-10-CM

## 2018-01-20 MED ORDER — ANASTROZOLE 1 MG PO TABS: 1.0000 mg | ORAL_TABLET | Freq: Every day | ORAL | 3 refills | Status: DC

## 2018-01-20 NOTE — Progress Notes (Addendum)
[place]  [phone] [phone]     ID: Elaine [name] DOB: [birth year]  MR#: [id number]  GYK#:[id number]  Patient Care Team: Quentin Cornwall, MD as PCP - General (Family Medicine) Magrinat, Virgie Dad, MD as Consulting Physician (Oncology) Kyung Rudd, MD as Consulting Physician (Radiation Oncology) Rolm Bookbinder, MD as Consulting Physician (General Surgery) Lelon Perla, MD as Referring Physician (Internal Medicine) Maisie Fus, MD as Consulting Physician (Obstetrics and Gynecology) OTHER MD:  CHIEF COMPLAINT: Estrogen receptor positive lobular breast cancer  CURRENT TREATMENT: Anastrozole   HISTORY OF CURRENT ILLNESS: Elaine [name] has a history of left breast cancer, status post mastectomy at [facility] under Dr. Arman Bogus in 1995.  She recalls this is being lymph node negative.  She did not receive radiation or chemotherapy but did take tamoxifen for 2 years.  Note that because of osteoporosis issues she was treated with raloxifene/Evista for approximately 8 years, between 1998 and 2005 per her recollection..   More recently she noted a change in her left breast while taking a shower in April 2019  She was already scheduled for mammography in June, which is when she had a unilateral right diagnostic mammography with tomography at [facility] in [place], [place] on 09/26/2017 showing: Highly suspicious mass within the lower inner quadrant of the right breast measuring 1.1 cm.  There is no mention of axillary imaging in the ultrasound report.  Accordingly on 10/22/2017 she proceeded to biopsy of the right breast area in question. The pathology from this procedure showed ([id number]): Breast, right, needle core biopsy, lower inner 5 o'clock position with invasive lobular carcinoma, E-cadherin negative, grade 1. Prognostic indicators significant for: Estrogen receptor, 70% positive with moderate staining intensity and  progesterone receptor, 80% positive with strong staining intensity. Proliferation marker Ki67 at 10%. HER2 negative with a signals ratio of 1.62 and number per cell 3.65  The patient's subsequent history is as detailed below.  INTERVAL HISTORY: Elaine [name] is here today for follow up of her estrogen positive breast cancer unaccompanied.  Since her last visit she underwent a right breast mastectomy on 12/30/2017 that revealed a grade 2, 1.3cm invasive lobular carcinoma.  Margins were negative and 4 sentinel lymph nodes that were biopsied were negative.    She last saw Dr. Donne Hazel one week ago and is due to f/u next week.  She will also meet with PT next week.  She is doing well and has minimal post surgical pain.  She did note some swelling because she didn't wear her binder post surgery, but otherwise is feeling well.     REVIEW OF SYSTEMS: Elaine [name] has a few CT chest that is being closely followed and has COPD.  She was supposed to get in with pulmonology around here and would like for me to look into this.   She is doing well since quitting smoking.  She is off of oxygen.  She denies any unusual headaches or vision changes.  She is without nausea, vomiting, bowel/bladder changes.  She denies chest pain, palpitations, shortness of breath or cough.  A detailed ROS was otherwise non contributory.    PAST MEDICAL HISTORY: Past Medical History:  Diagnosis Date  . Breast cancer ([place]) 1995   left   . Cataract   . Complication of anesthesia   . Emphysema of lung ([place])   . Family history of breast cancer   . Hiatal hernia 1992  . Hypertension   . Hyperthyroidism   .  Osteoporosis   . PONV (postoperative nausea and vomiting)     PAST SURGICAL HISTORY: Past Surgical History:  Procedure Laterality Date  . MASTECTOMY Left 1995  . MASTECTOMY W/ SENTINEL NODE BIOPSY Right 12/30/2017  . MASTECTOMY W/ SENTINEL NODE BIOPSY Right 12/30/2017   Procedure: MASTECTOMY WITH SENTINEL LYMPH NODE BIOPSY;  Surgeon:  Rolm Bookbinder, MD;  Location: [facility];  Service: General;  Laterality: Right;  . TOTAL ABDOMINAL HYSTERECTOMY  06/22/1993    FAMILY HISTORY Family History  Problem Relation Age of Onset  . Breast cancer Cousin 16  . Breast cancer Cousin        20's/30's  . Breast cancer Cousin        3 of Mcousins1xremoved (mom's cousins) had breast cancer 70+   She notes that her father died from MI at age 26. Patients' mother died from aneurysm at age 8. The patient had 1 brother (died age 78 months from flu) and 2 sisters (1 deceased at 57 due to complications from DM).  The patient had 4 maternal great cousins with breast cancer all diagnosed in their 41's.  A paternal cousins daughter was diagnosed with breast cancer in her 41s. Patient denies anyone in her family having ovarian cancer.   GYNECOLOGIC HISTORY:  No LMP recorded. Patient has had a hysterectomy. Menarche: 72 years old Age at first live birth: [place] P0 LMP: Hysterectomy with bilateral salpingo-oophorectomy at age 68: No HRT  SOCIAL HISTORY: She is semi-retired from [employer]. She is single and lives alone. She doesn't have any pets. She is a Tourist information centre manager.      ADVANCED DIRECTIVES: Her HCPOA is her sister Tharon Aquas who can be reached at [phone]    HEALTH MAINTENANCE: Social History   Tobacco Use  . Smoking status: Current Some Day Smoker    Types: Cigarettes  . Smokeless tobacco: Never Used  Substance Use Topics  . Alcohol use: Yes    Comment: Martini daily  . Drug use: Never     Colonoscopy: Refuses  PAP: Is post colonoscopy  Bone density: completed every 2 years with prolia injections, due for one on 11/19/2017.    Allergies  Allergen Reactions  . Bee Venom Other (See Comments)    Unknown  . Codeine Nausea Only  . Macrobid [Nitrofurantoin Macrocrystal] Other (See Comments)    Unknown  . Morphine And Related Other (See Comments)    Unknown  . Penicillins Other (See Comments)    Unknown Has  patient had a PCN reaction causing immediate rash, facial/tongue/throat swelling, SOB or lightheadedness with hypotension: Unknown Has patient had a PCN reaction causing severe rash involving mucus membranes or skin necrosis: Unknown Has patient had a PCN reaction that required hospitalization: Unknown Has patient had a PCN reaction occurring within the last 10 years: No If all of the above answers are "NO", then may proceed with Cephalosporin use.   . Tyloxapol Nausea Only and Other (See Comments)    Extreme Drugged feeling, Doesn't affect pain    Current Outpatient Medications  Medication Sig Dispense Refill  . azithromycin (ZITHROMAX) 250 MG tablet Take 250 mg by mouth every Monday, Wednesday, and Friday.     . denosumab (PROLIA) 60 MG/ML SOSY injection Inject 60 mg into the skin every 6 (six) months.     Marland Kitchen losartan-hydrochlorothiazide (HYZAAR) 50-12.5 MG tablet Take 1 tablet by mouth daily.  1  . predniSONE (DELTASONE) 5 MG tablet Take 5 mg by mouth at bedtime.     Marland Kitchen  SYNTHROID 75 MCG tablet Take 75 mcg by mouth daily before breakfast.   1  . umeclidinium-vilanterol (ANORO ELLIPTA) 62.5-25 MCG/INH AEPB Inhale 1 puff into the lungs daily.    Marland Kitchen anastrozole (ARIMIDEX) 1 MG tablet Take 1 tablet (1 mg total) by mouth daily. 30 tablet 3  . ipratropium-albuterol (DUONEB) 0.5-2.5 (3) MG/3ML SOLN Inhale 3 mLs into the lungs See admin instructions. Use 1 vial via nebulizer daily. May use additional vial if needed at bedtime for shortness of breath.  3  . methocarbamol (ROBAXIN) 500 MG tablet Take 1 tablet (500 mg total) by mouth every 8 (eight) hours as needed for muscle spasms (use for muscle cramps/pain). (Patient not taking: Reported on 01/20/2018) 20 tablet 0  . OXYGEN Inhale 2 L into the lungs continuous.    . VENTOLIN HFA 108 (90 Base) MCG/ACT inhaler Inhale 2 puffs into the lungs every 4 (four) hours as needed for wheezing or shortness of breath.   3   No current facility-administered  medications for this visit.     OBJECTIVE:  Vitals:   01/20/18 1014  BP: 122/64  Pulse: 73  Resp: 18  Temp: (!) 97.5 F (36.4 C)  SpO2: 97%     Body mass index is 25.49 kg/m.   Wt Readings from Last 3 Encounters:  01/20/18 143 lb 14.4 oz (65.3 kg)  12/30/17 138 lb (62.6 kg)  12/23/17 140 lb 8 oz (63.7 kg)      ECOG FS:2 - Symptomatic, <50% confined to bed GENERAL: Patient is a well appearing female in no acute distress HEENT:  Sclerae anicteric.  Oropharynx clear and moist. No ulcerations or evidence of oropharyngeal candidiasis. Neck is supple.  NODES:  No cervical, supraclavicular, or axillary lymphadenopathy palpated.  BREAST EXAM:  S/p bilateral mastectomies, right mastectomy healing well, left mastectomy already well healed (from [place]) No sign of local recurrence LUNGS:  Clear to auscultation bilaterally.  No wheezes or rhonchi. HEART:  Regular rate and rhythm. No murmur appreciated. ABDOMEN:  Soft, nontender.  Positive, normoactive bowel sounds. No organomegaly palpated. MSK:  No focal spinal tenderness to palpation. Limited ROM in right arm (shoulder) EXTREMITIES:  No peripheral edema.   SKIN:  Clear with no obvious rashes or skin changes. No nail dyscrasia. NEURO:  Nonfocal. Well oriented.  Appropriate affect.     LAB RESULTS:  CMP     Component Value Date/Time   NA 137 12/31/2017 0437   K 4.2 12/31/2017 0437   CL 107 12/31/2017 0437   CO2 20 (L) 12/31/2017 0437   GLUCOSE 117 (H) 12/31/2017 0437   BUN 12 12/31/2017 0437   CREATININE 0.89 12/31/2017 0437   CREATININE 0.98 11/06/2017 1448   CALCIUM 8.3 (L) 12/31/2017 0437   PROT 6.5 11/06/2017 1448   ALBUMIN 4.1 11/06/2017 1448   AST 15 11/06/2017 1448   ALT 17 11/06/2017 1448   ALKPHOS 62 11/06/2017 1448   BILITOT 1.2 11/06/2017 1448   GFRNONAA >60 12/31/2017 0437   GFRNONAA 56 (L) 11/06/2017 1448   GFRAA >60 12/31/2017 0437   GFRAA >60 11/06/2017 1448    No results found for: TOTALPROTELP,  ALBUMINELP, A1GS, A2GS, BETS, BETA2SER, GAMS, MSPIKE, SPEI  No results found for: KPAFRELGTCHN, LAMBDASER, KAPLAMBRATIO  Lab Results  Component Value Date   WBC 15.6 (H) 12/31/2017   NEUTROABS 5.0 11/06/2017   HGB 13.8 12/31/2017   HCT 40.5 12/31/2017   MCV 98.1 12/31/2017   PLT 200 12/31/2017    @LASTCHEMISTRY @  No results  found for: LABCA2  No components found for: INOMVE720  No results for input(s): INR in the last 168 hours.  No results found for: LABCA2  No results found for: NOB096  No results found for: GEZ662  No results found for: HUT654  No results found for: CA2729  No components found for: HGQUANT  No results found for: CEA1 / No results found for: CEA1   No results found for: AFPTUMOR  No results found for: CHROMOGRNA  No results found for: PSA1  No visits with results within 3 Day(s) from this visit.  Latest known visit with results is:  Admission on 12/30/2017, Discharged on 12/31/2017  Component Date Value Ref Range Status  . Sodium 12/31/2017 137  135 - 145 mmol/L Final  . Potassium 12/31/2017 4.2  3.5 - 5.1 mmol/L Final  . Chloride 12/31/2017 107  98 - 111 mmol/L Final  . CO2 12/31/2017 20* 22 - 32 mmol/L Final  . Glucose, Bld 12/31/2017 117* 70 - 99 mg/dL Final  . BUN 12/31/2017 12  8 - 23 mg/dL Final  . Creatinine, Ser 12/31/2017 0.89  0.44 - 1.00 mg/dL Final  . Calcium 12/31/2017 8.3* 8.9 - 10.3 mg/dL Final  . GFR calc non Af Amer 12/31/2017 >60  >60 mL/min Final  . GFR calc Af Amer 12/31/2017 >60  >60 mL/min Final   Comment: (NOTE) The eGFR has been calculated using the CKD EPI equation. This calculation has not been validated in all clinical situations. eGFR's persistently <60 mL/min signify possible Chronic Kidney Disease.   . Anion gap 12/31/2017 10  5 - 15 Final   Performed at [facility], [place] [address], [place], [place] [address]  . WBC 12/31/2017 15.6* 4.0 - 10.5 K/uL Final  . RBC 12/31/2017 4.13  3.87 - 5.11 MIL/uL  Final  . Hemoglobin 12/31/2017 13.8  12.0 - 15.0 g/dL Final  . HCT 12/31/2017 40.5  36.0 - 46.0 % Final  . MCV 12/31/2017 98.1  78.0 - 100.0 fL Final  . MCH 12/31/2017 33.4  26.0 - 34.0 pg Final  . MCHC 12/31/2017 34.1  30.0 - 36.0 g/dL Final  . RDW 12/31/2017 12.1  11.5 - 15.5 % Final  . Platelets 12/31/2017 200  150 - 400 K/uL Final   Performed at [facility], [place] [address], [place], [place] [address]    (this displays the last labs from the last 3 days)  No results found for: TOTALPROTELP, ALBUMINELP, A1GS, A2GS, BETS, BETA2SER, GAMS, MSPIKE, SPEI (this displays SPEP labs)  No results found for: KPAFRELGTCHN, LAMBDASER, KAPLAMBRATIO (kappa/lambda light chains)  No results found for: HGBA, HGBA2QUANT, HGBFQUANT, HGBSQUAN (Hemoglobinopathy evaluation)   No results found for: LDH  No results found for: IRON, TIBC, IRONPCTSAT (Iron and TIBC)  No results found for: FERRITIN  Urinalysis No results found for: COLORURINE, APPEARANCEUR, LABSPEC, PHURINE, GLUCOSEU, HGBUR, BILIRUBINUR, KETONESUR, PROTEINUR, UROBILINOGEN, NITRITE, LEUKOCYTESUR   STUDIES:  Nm Sentinel Node Inj-no Rpt (breast)  Result Date: 12/30/2017 Sulfur colloid was injected by the nuclear medicine technologist for melanoma sentinel node.    ELIGIBLE FOR AVAILABLE RESEARCH PROTOCOL: no  ASSESSMENT: 72 y.o. [place] woman status post right breast lower inner quadrant biopsy 10/22/2017 for a clinical T1c N0, stage IA invasive lobular breast cancer, grade 1, estrogen and progesterone receptor positive, HER-2 not amplified, with an MIB-1 of 10%  (0) status post left mastectomy 1995 for a [place] N0 ductal breast cancer  (a) adjuvant tamoxifen x2 years  (b)  note patient took raloxifene/ Evista [id number] for osteoporosis  (1) genetics testing: Met with counselors 11/06/2016, testing being considered  (2) Right breast mastectomy on 12/30/2017 demonstrating 1.3cm invasive lobular carcinoma, grade 2, DCIS  and LCIS, margins negative, and 4 SLN were also negative  (3) Oncotype DX 18 giving 5% risk of distant recurrence, and indicating no benefit from chemotherapy  (4) adjuvant radiation not indicated  (5) anastrozole starting 01/20/2018  (6) tobacco abuse: Patient stopped smoking 11/02/2016  (7) osteoporosis: Currently on denosumab/Prolia every 6 months  PLAN: I spent approximately 30 minutes face to face with Elaine [name] with more than 67% of that time spent in counseling and coordination of care.  Elaine [name] is doing well today.  She is healing from her surgery well.  She met with Dr. Jana Hakim and reviewed her pathology, her oncotype DX and her plan with anti estrogen therapy.  He reviewed Anastrozole in detail.  He reviewed the purpose of anastrozole, and potential adverse effects.    Elaine [name] is currently receiving Prolia with her PCP and will continue this.  She is due for her bone density next year.   I will work on Geographical information systems officer in with Dr. Chase Caller in pulmonology.  Elaine [name] will return in 3 months for labs and f/u.    Vear has a good understanding of the overall plan. She agrees with it. She knows the goal of treatment in her case is cure. She will call with any problems that may develop before her next visit here.  Elaine Bihari, NP  01/20/18 4:22 PM Medical Oncology and Hematology [facility] [address] [place], [place] [address] Tel. [phone]    Fax. [phone]     ADDENDUM: Leatrice did very well with her surgery and the results indicate no need for adjuvant radiation.  She is therefore done with local therapy.  She is now ready to start antiestrogens. We discussed the difference between tamoxifen and anastrozole in detail. She understands that anastrozole and the aromatase inhibitors in general work by blocking estrogen production. Accordingly vaginal dryness, decrease in bone density, and of course hot flashes can result. The aromatase inhibitors can also  negatively affect the cholesterol profile, although that is a minor effect. One out of 5 women on aromatase inhibitors we will feel "old and achy". This arthralgia/myalgia syndrome, which resembles fibromyalgia clinically, does resolve with stopping the medications. Accordingly this is not a reason to not try an aromatase inhibitor but it is a frequent reason to stop it (in other words 20% of women will not be able to tolerate these medications).  Tamoxifen on the other hand does not block estrogen production. It does not "take away a woman's estrogen". It blocks the estrogen receptor in breast cells. Like anastrozole, it can also cause hot flashes. As opposed to anastrozole, tamoxifen has many estrogen-like effects. It is technically an estrogen receptor modulator. This means that in some tissues tamoxifen works like estrogen-- for example it helps strengthen the bones. It tends to improve the cholesterol profile. It can cause thickening of the endometrial lining, and even endometrial polyps or rarely cancer of the uterus.(The risk of uterine cancer due to tamoxifen is one additional cancer per thousand women year). It can cause vaginal wetness or stickiness. It can cause blood clots through this estrogen-like effect--the risk of blood clots with tamoxifen is exactly the same as with birth control pills or hormone replacement.  Neither of these agents causes mood changes or weight gain, despite the popular belief  that they can have these side effects. We have data from studies comparing either of these drugs with placebo, and in those cases the control group had the same amount of weight gain and depression as the group that took the drug.  We are going to give anastrozole a try.  She will see [place] in about 3 months to make sure she is tolerating it well.  In the meantime we will see if we can get her the pulmonary follow-up that she is interested in.  I personally saw this patient and performed a substantive  portion of this encounter with the listed APP documented above.   Chauncey Cruel, MD Medical Oncology and Hematology [facility] [address] [place], [place] [address] Tel. [phone]    Fax. [phone]

## 2018-01-20 NOTE — Patient Instructions (Signed)
Bone Health Bones protect organs, store calcium, and anchor muscles. Good health habits, such as eating nutritious foods and exercising regularly, are important for maintaining healthy bones. They can also help to prevent a condition that causes bones to lose density and become weak and brittle (osteoporosis). Why is bone mass important? Bone mass refers to the amount of bone tissue that you have. The higher your bone mass, the stronger your bones. An important step toward having healthy bones throughout life is to have strong and dense bones during childhood. A young adult who has a high bone mass is more likely to have a high bone mass later in life. Bone mass at its greatest it is called peak bone mass. A large decline in bone mass occurs in older adults. In women, it occurs about the time of menopause. During this time, it is important to practice good health habits, because if more bone is lost than what is replaced, the bones will become less healthy and more likely to break (fracture). If you find that you have a low bone mass, you may be able to prevent osteoporosis or further bone loss by changing your diet and lifestyle. How can I find out if my bone mass is low? Bone mass can be measured with an X-ray test that is called a bone mineral density (BMD) test. This test is recommended for all women who are age 65 or older. It may also be recommended for men who are age 70 or older, or for people who are more likely to develop osteoporosis due to:  Having bones that break easily.  Having a long-term disease that weakens bones, such as kidney disease or rheumatoid arthritis.  Having menopause earlier than normal.  Taking medicine that weakens bones, such as steroids, thyroid hormones, or hormone treatment for breast cancer or prostate cancer.  Smoking.  Drinking three or more alcoholic drinks each day.  What are the nutritional recommendations for healthy bones? To have healthy bones, you  need to get enough of the right minerals and vitamins. Most nutrition experts recommend getting these nutrients from the foods that you eat. Nutritional recommendations vary from person to person. Ask your health care provider what is healthy for you. Here are some general guidelines. Calcium Recommendations Calcium is the most important (essential) mineral for bone health. Most people can get enough calcium from their diet, but supplements may be recommended for people who are at risk for osteoporosis. Good sources of calcium include:  Dairy products, such as low-fat or nonfat milk, cheese, and yogurt.  Dark green leafy vegetables, such as bok choy and broccoli.  Calcium-fortified foods, such as orange juice, cereal, bread, soy beverages, and tofu products.  Nuts, such as almonds.  Follow these recommended amounts for daily calcium intake:  Children, age 1?3: 700 mg.  Children, age 4?8: 1,000 mg.  Children, age 9?13: 1,300 mg.  Teens, age 14?18: 1,300 mg.  Adults, age 19?50: 1,000 mg.  Adults, age 51?70: ? Men: 1,000 mg. ? Women: 1,200 mg.  Adults, age 71 or older: 1,200 mg.  Pregnant and breastfeeding females: ? Teens: 1,300 mg. ? Adults: 1,000 mg.  Vitamin D Recommendations Vitamin D is the most essential vitamin for bone health. It helps the body to absorb calcium. Sunlight stimulates the skin to make vitamin D, so be sure to get enough sunlight. If you live in a cold climate or you do not get outside often, your health care provider may recommend that you take vitamin   D supplements. Good sources of vitamin D in your diet include:  Egg yolks.  Saltwater fish.  Milk and cereal fortified with vitamin D.  Follow these recommended amounts for daily vitamin D intake:  Children and teens, age 1?18: 600 international units.  Adults, age 50 or younger: 400-800 international units.  Adults, age 51 or older: 800-1,000 international units.  Other Nutrients Other nutrients  for bone health include:  Phosphorus. This mineral is found in meat, poultry, dairy foods, nuts, and legumes. The recommended daily intake for adult men and adult women is 700 mg.  Magnesium. This mineral is found in seeds, nuts, dark green vegetables, and legumes. The recommended daily intake for adult men is 400?420 mg. For adult women, it is 310?320 mg.  Vitamin K. This vitamin is found in green leafy vegetables. The recommended daily intake is 120 mg for adult men and 90 mg for adult women.  What type of physical activity is best for building and maintaining healthy bones? Weight-bearing and strength-building activities are important for building and maintaining peak bone mass. Weight-bearing activities cause muscles and bones to work against gravity. Strength-building activities increases muscle strength that supports bones. Weight-bearing and muscle-building activities include:  Walking and hiking.  Jogging and running.  Dancing.  Gym exercises.  Lifting weights.  Tennis and racquetball.  Climbing stairs.  Aerobics.  Adults should get at least 30 minutes of moderate physical activity on most days. Children should get at least 60 minutes of moderate physical activity on most days. Ask your health care provide what type of exercise is best for you. Where can I find more information? For more information, check out the following websites:  National Osteoporosis Foundation: http://nof.org/learn/basics  National Institutes of Health: http://www.niams.nih.gov/Health_Info/Bone/Bone_Health/bone_health_for_life.asp  This information is not intended to replace advice given to you by your health care provider. Make sure you discuss any questions you have with your health care provider. Document Released: 06/08/2003 Document Revised: 10/06/2015 Document Reviewed: 03/23/2014 Elsevier Interactive Patient Education  2018 Elsevier Inc.  

## 2018-01-20 NOTE — Telephone Encounter (Signed)
Gave patient avs and calendar.   °

## 2018-01-21 ENCOUNTER — Other Ambulatory Visit: Payer: Self-pay | Admitting: Adult Health

## 2018-01-21 ENCOUNTER — Encounter (HOSPITAL_COMMUNITY): Payer: Self-pay | Admitting: Oncology

## 2018-01-21 ENCOUNTER — Other Ambulatory Visit: Payer: Self-pay | Admitting: Oncology

## 2018-01-21 DIAGNOSIS — J438 Other emphysema: Secondary | ICD-10-CM

## 2018-01-23 ENCOUNTER — Encounter: Payer: Self-pay | Admitting: Oncology

## 2018-01-26 ENCOUNTER — Other Ambulatory Visit: Payer: Self-pay

## 2018-01-26 ENCOUNTER — Ambulatory Visit: Payer: Medicare Other | Attending: General Surgery | Admitting: Rehabilitation

## 2018-01-26 ENCOUNTER — Encounter: Payer: Self-pay | Admitting: Rehabilitation

## 2018-01-26 DIAGNOSIS — M25611 Stiffness of right shoulder, not elsewhere classified: Secondary | ICD-10-CM | POA: Insufficient documentation

## 2018-01-26 DIAGNOSIS — Z483 Aftercare following surgery for neoplasm: Secondary | ICD-10-CM | POA: Diagnosis not present

## 2018-01-26 DIAGNOSIS — Z9189 Other specified personal risk factors, not elsewhere classified: Secondary | ICD-10-CM | POA: Insufficient documentation
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
## 2018-01-26 NOTE — Patient Instructions (Signed)
Access Code: [email or web address]  URL: [email or web address]  Date: 01/26/2018  Prepared by: Shan Levans   Exercises  Supine Shoulder Flexion Extension AAROM with Dowel - 10 reps - 5 seconds hold - 1x daily - 7x weekly  Supine Chest Stretch with Elbows Bent - 2 reps - 1 sets - 30-60seconds hold - 2x daily - 7x weekly  Shoulder Scaption AAROM with Dowel - 10 reps - 3-5 seconds hold - 1x daily - 7x weekly  Standing Shoulder Extension with Dowel - 10 reps - 3 sets - 1x daily - 7x weekly  Doorway Pec Stretch at 90 Degrees Abduction - 3 reps - 1 sets - 30-60seconds hold - 2x daily - 7x weekly  Standing Shoulder Row - 20 reps - 3-5 seconds hold - 2x daily - 7x weekly

## 2018-01-28 ENCOUNTER — Institutional Professional Consult (permissible substitution): Payer: Medicare Other | Admitting: Acute Care

## 2018-01-31 ENCOUNTER — Encounter: Payer: Self-pay | Admitting: Adult Health

## 2018-02-11 ENCOUNTER — Encounter: Payer: Self-pay | Admitting: Rehabilitation

## 2018-02-11 ENCOUNTER — Ambulatory Visit (INDEPENDENT_AMBULATORY_CARE_PROVIDER_SITE_OTHER): Payer: Medicare Other | Admitting: Pulmonary Disease

## 2018-02-11 ENCOUNTER — Encounter: Payer: Self-pay | Admitting: Pulmonary Disease

## 2018-02-11 ENCOUNTER — Ambulatory Visit: Payer: Medicare Other | Attending: General Surgery | Admitting: Rehabilitation

## 2018-02-11 VITALS — BP 118/50 | HR 91 | Ht 62.0 in | Wt 149.6 lb

## 2018-02-11 DIAGNOSIS — Z483 Aftercare following surgery for neoplasm: Secondary | ICD-10-CM | POA: Diagnosis not present

## 2018-02-11 DIAGNOSIS — J432 Centrilobular emphysema: Secondary | ICD-10-CM

## 2018-02-11 DIAGNOSIS — R911 Solitary pulmonary nodule: Secondary | ICD-10-CM

## 2018-02-11 DIAGNOSIS — R918 Other nonspecific abnormal finding of lung field: Secondary | ICD-10-CM

## 2018-02-11 DIAGNOSIS — Z9189 Other specified personal risk factors, not elsewhere classified: Secondary | ICD-10-CM | POA: Insufficient documentation

## 2018-02-11 DIAGNOSIS — M25611 Stiffness of right shoulder, not elsewhere classified: Secondary | ICD-10-CM | POA: Diagnosis present

## 2018-02-11 NOTE — Patient Instructions (Addendum)
  1. Chronic obstructive pulmonary disease Congratulations on quitting smoking!!! CONTINUE azithromycin 250 mg every Monday, Wednesday and Friday CONTINUE Anoro Ellipta 62.5-25 mcg 1 puff daily CONTINUE albuterol inhaler 2 puffs evert 4 hours as needed for shortness of breath or wheezing  2. Right middle lobe groundglass lung nodule, 24mm  ORDERED CT Chest without contrast to be completed for December Once completed, please contact our office so we can request for CD to be mailed  Please follow-up with me in 3 months

## 2018-02-11 NOTE — Patient Instructions (Signed)
Strength ABC program How to progress with her trainer How to look for need for compression during exercise and what to order if she needs anything

## 2018-02-11 NOTE — Progress Notes (Signed)
Synopsis: Referred in 01/2018 for COPD  Subjective:   PATIENT ID: Elaine [name] GENDER: female DOB: 1946/02/25, MRN: [id number]   HPI  Chief Complaint  Patient presents with  . CONSULT    History COPD.  had mastectomy 6 weeks ago and had problems with COPD. was being treated in [place] but looking for a change.    Elaine [name] is a 72 year old female who presents as a new patient visit for COPD.  She was diagnosed with COPD two years ago. When she has symptoms she feels as if her "throat closing up." Associated symptoms include Intermittent productive cough with clear sputum that usually occurs in the morning. Denies wheezing. Denies breathing issues overnight. She had tried ProAir and Symbicort but did not feel this was working. Tried Bevespi without improvement. For the last years, she has been taking Anoro and Ventolin and feels like it has helped. Denies any hospitalizations or ED visits for COPD exacerbations. She previously required oxygen since July 2019 however this was discontinued when she went was hospitalized for mastectomy and found to have normal oxygen saturations. She was treated with outpatients exacerbations three times in the last 12 months. Has not required her Ventolin since October.   Denies headaches, dizziness, changes in vision, syncope. Denies wheezing.   50 pack year history. She quit smoking one month ago. She has restarted working with her Physiological scientist.  I have personally reviewed patient's past medical/family/social history, allergies, current medications.  Past Medical History:  Diagnosis Date  . Breast cancer ([place]) 1995   left   . Cataract   . Complication of anesthesia   . Emphysema of lung ([place])   . Family history of breast cancer   . Hiatal hernia 1992  . Hypertension   . Hyperthyroidism   . Osteoporosis   . PONV (postoperative nausea and vomiting)      Family History  Problem Relation Age of Onset  . Aneurysm Mother   . Heart  attack Father   . Diabetes Sister   . Diabetes Sister   . Breast cancer Cousin 20  . Breast cancer Cousin        20's/30's  . Breast cancer Cousin        3 of [id number] (mom's cousins) had breast cancer 70+     Social History   Occupational History  . Not on file  Tobacco Use  . Smoking status: Former Smoker    Packs/day: 1.00    Years: 50.00    Pack years: 50.00    Types: Cigarettes    Last attempt to quit: 12/18/2017    Years since quitting: 0.1  . Smokeless tobacco: Never Used  Substance and Sexual Activity  . Alcohol use: Yes    Comment: Martini daily  . Drug use: Never  . Sexual activity: Not on file    Allergies  Allergen Reactions  . Bee Venom Other (See Comments)    Unknown  . Codeine Nausea Only  . Macrobid [Nitrofurantoin Macrocrystal] Other (See Comments)    Unknown  . Morphine And Related Other (See Comments)    Unknown  . Penicillins Other (See Comments)    Unknown Has patient had a PCN reaction causing immediate rash, facial/tongue/throat swelling, SOB or lightheadedness with hypotension: Unknown Has patient had a PCN reaction causing severe rash involving mucus membranes or skin necrosis: Unknown Has patient had a PCN reaction that required hospitalization: Unknown Has patient had a PCN reaction occurring within the last 10 years:  No If all of the above answers are "NO", then may proceed with Cephalosporin use.   . Tyloxapol Nausea Only and Other (See Comments)    Extreme Drugged feeling, Doesn't affect pain     Outpatient Medications Prior to Visit  Medication Sig Dispense Refill  . azithromycin (ZITHROMAX) 250 MG tablet Take 250 mg by mouth every Monday, Wednesday, and Friday.     . denosumab (PROLIA) 60 MG/ML SOSY injection Inject 60 mg into the skin every 6 (six) months.     Marland Kitchen losartan-hydrochlorothiazide (HYZAAR) 50-12.5 MG tablet Take 1 tablet by mouth daily.  1  . SYNTHROID 75 MCG tablet Take 75 mcg by mouth daily before breakfast.    1  . umeclidinium-vilanterol (ANORO ELLIPTA) 62.5-25 MCG/INH AEPB Inhale 1 puff into the lungs daily.    . methocarbamol (ROBAXIN) 500 MG tablet Take 1 tablet (500 mg total) by mouth every 8 (eight) hours as needed for muscle spasms (use for muscle cramps/pain). 20 tablet 0  . predniSONE (DELTASONE) 5 MG tablet Take 5 mg by mouth at bedtime.     Marland Kitchen anastrozole (ARIMIDEX) 1 MG tablet Take 1 tablet (1 mg total) by mouth daily. (Patient not taking: Reported on 02/11/2018) 30 tablet 3  . ipratropium-albuterol (DUONEB) 0.5-2.5 (3) MG/3ML SOLN Inhale 3 mLs into the lungs See admin instructions. Use 1 vial via nebulizer daily. May use additional vial if needed at bedtime for shortness of breath.  3  . OXYGEN Inhale 2 L into the lungs continuous.    . VENTOLIN HFA 108 (90 Base) MCG/ACT inhaler Inhale 2 puffs into the lungs every 4 (four) hours as needed for wheezing or shortness of breath.   3   No facility-administered medications prior to visit.     Review of Systems  Constitutional: Negative for chills, diaphoresis, fever, malaise/fatigue and weight loss.  HENT: Negative for congestion and sore throat.        "throat closing up"  Eyes: Negative for blurred vision.  Respiratory: Positive for cough, sputum production and shortness of breath. Negative for hemoptysis and wheezing.   Cardiovascular: Negative for chest pain, orthopnea, leg swelling and PND.  Gastrointestinal: Negative for abdominal pain, heartburn and nausea.  Genitourinary: Negative for frequency.  Musculoskeletal: Negative for myalgias.  Skin: Negative for rash.  Neurological: Negative for dizziness, weakness and headaches.  Endo/Heme/Allergies: Does not bruise/bleed easily.  All other systems reviewed and are negative.    Objective:  Physical Exam  Constitutional: She is oriented to person, place, and time. She appears well-developed and well-nourished. No distress.  HENT:  Head: Normocephalic and atraumatic.  Nose: Nose  normal.  Mouth/Throat: No oropharyngeal exudate.  Eyes: Pupils are equal, round, and reactive to light. Conjunctivae and EOM are normal. No scleral icterus.  Neck: Normal range of motion. Neck supple. No JVD present. No tracheal deviation present.  Cardiovascular: Normal rate, regular rhythm, normal heart sounds and intact distal pulses. Exam reveals no gallop and no friction rub.  No murmur heard. Pulmonary/Chest: Effort normal and breath sounds normal. No stridor. No respiratory distress. She has no wheezes. She has no rales.  Abdominal: Soft. Bowel sounds are normal. She exhibits no distension. There is no tenderness.  Musculoskeletal: Normal range of motion. She exhibits no edema or deformity.  Neurological: She is alert and oriented to person, place, and time. No cranial nerve deficit or sensory deficit.  Skin: Skin is warm and dry. No rash noted. She is not diaphoretic. No erythema. No pallor.  Psychiatric:  She has a normal mood and affect. Her behavior is normal. Judgment and thought content normal.  Vitals reviewed.    Vitals:   02/11/18 1406  BP: (!) 118/50  Pulse: 91  SpO2: 98%  Weight: 149 lb 9.6 oz (67.9 kg)  Height: 5\' 2"  (1.575 m)   SpO2: 98 % O2 Device: None (Room air)   Chest imaging: CT Chest 07/24/17 - Severe emphysema. RML groundglass nodule 78mm  PFT: None on file  Absolute Eosinophils 11/06/2017: 300  I have personally reviewed the above labs, images and tests noted above.    Assessment & Plan:  72 year old female presents as a new patient following pulmonary issues:  1. Chronic obstructive pulmonary disease Congratulations on quitting smoking!!! CONTINUE azithromycin 250 mg every Monday, Wednesday and Friday CONTINUE Anoro Ellipta 62.5-25 mcg 1 puff daily CONTINUE albuterol inhaler 2 puffs evert 4 hours as needed for shortness of breath or wheezing  2. Right middle lobe groundglass lung nodule, 43mm  ORDERED CT Chest without contrast to be completed  for December Once completed, please contact our office so we can request for CD to be mailed  ADDENDUM: OSH record requested and uploaded on PACS. I have reviewed imaging as noted above.   Orders Placed This Encounter  Procedures  . CT CHEST WO CONTRAST    Standing Status:   Future    Standing Expiration Date:   04/14/2019    Order Specific Question:   Preferred imaging location?    Answer:   External    Comments:   [facility]    Order Specific Question:   Radiology Contrast Protocol - do NOT remove file path    Answer:   [email or web address]  No orders of the defined types were placed in this encounter.   Return in about 3 months (around 05/14/2018).   Thank you for choosing [facility] for your health needs!   Chi Rodman Pickle, MD [facility] 02/13/2018 8:16 AM  Personal pager: [phone] If unanswered, please page CCM On-call: [phone]
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
## 2018-02-13 ENCOUNTER — Encounter: Payer: Self-pay | Admitting: Pulmonary Disease

## 2018-03-05 ENCOUNTER — Encounter: Payer: Self-pay | Admitting: Oncology

## 2018-03-06 ENCOUNTER — Encounter: Payer: Self-pay | Admitting: Oncology

## 2018-03-06 ENCOUNTER — Other Ambulatory Visit: Payer: Self-pay | Admitting: Oncology

## 2018-03-09 ENCOUNTER — Telehealth: Payer: Self-pay | Admitting: Pulmonary Disease

## 2018-03-09 NOTE — Telephone Encounter (Signed)
Call made to patient, she just wanted to let Dr. Loanne Drilling know that she had her CT today.   JE please advise of CT results.

## 2018-03-10 NOTE — Telephone Encounter (Signed)
Her CT appears to be have been completed at an OSH. Please request CD so we can upload into our PACS.

## 2018-03-13 NOTE — Telephone Encounter (Signed)
Call made to [facility], they are mailing the disc out to [place] so we can review the scans. Nothing further is needed at this time.

## 2018-03-18 ENCOUNTER — Telehealth: Payer: Self-pay | Admitting: Pulmonary Disease

## 2018-03-18 DIAGNOSIS — J432 Centrilobular emphysema: Secondary | ICD-10-CM

## 2018-03-18 NOTE — Telephone Encounter (Signed)
Elaine [name],  Please contact patient regarding the following:  We have received your CT Chest from 03/09/18. Imaging and report have been reviewed. Good news, the lung nodule that was previously seen on your scan in April is no longer present. This may have represented inflammation or infection that has now resolved. I recommend annual CT scan due to your history of smoking. I will see you at our next appointment together in February.  J.Amirrah Quigley, MD

## 2018-03-19 ENCOUNTER — Telehealth: Payer: Self-pay | Admitting: Pulmonary Disease

## 2018-03-19 NOTE — Telephone Encounter (Signed)
Called spoke with patient, advised of CT results/recs as stated by Dr Loanne Drilling below Patient voiced her understanding and denied any questions/concerns Patient will keep her Feb 2020 appt and call the office if anything is needed prior to appt  CT Chest order placed for December 2020 Nothing further needed; will sign off

## 2018-03-19 NOTE — Telephone Encounter (Signed)
Attempted to contact patient on home phone and cell with no answer.  Dr. Loanne Drilling read CT and reported the following:  CT Chest from 03/09/18 Imaging and report have been reviewed. Good news, the lung nodule that was previously seen on your scan in April is no longer present. This may have represented inflammation or infection that has now resolved. I recommend annual CT scan due to your history of smoking. I will see you at our next appointment together in February. Will continue to try and reach patient with results.

## 2018-03-19 NOTE — Telephone Encounter (Signed)
Patient returned phone call; patient contact # [phone] or [phone]

## 2018-04-22 ENCOUNTER — Telehealth: Payer: Self-pay

## 2018-04-22 NOTE — Telephone Encounter (Signed)
Spoke with patient reminding of SCP visit with NP on 04/28/18 at 2 pm.  Patient says she will come to appt.

## 2018-04-28 ENCOUNTER — Encounter: Payer: Self-pay | Admitting: Adult Health

## 2018-04-28 ENCOUNTER — Inpatient Hospital Stay: Payer: Medicare Other | Attending: Oncology

## 2018-04-28 ENCOUNTER — Telehealth: Payer: Self-pay | Admitting: Oncology

## 2018-04-28 ENCOUNTER — Inpatient Hospital Stay (HOSPITAL_BASED_OUTPATIENT_CLINIC_OR_DEPARTMENT_OTHER): Payer: Medicare Other | Admitting: Adult Health

## 2018-04-28 VITALS — BP 141/69 | HR 75 | Temp 97.8°F | Resp 18 | Ht 63.0 in | Wt 148.1 lb

## 2018-04-28 DIAGNOSIS — R5383 Other fatigue: Secondary | ICD-10-CM | POA: Insufficient documentation

## 2018-04-28 DIAGNOSIS — Z853 Personal history of malignant neoplasm of breast: Secondary | ICD-10-CM

## 2018-04-28 DIAGNOSIS — Z87891 Personal history of nicotine dependence: Secondary | ICD-10-CM | POA: Insufficient documentation

## 2018-04-28 DIAGNOSIS — Z170 Estrogen receptor positive status [ER+]: Secondary | ICD-10-CM

## 2018-04-28 DIAGNOSIS — M810 Age-related osteoporosis without current pathological fracture: Secondary | ICD-10-CM | POA: Diagnosis not present

## 2018-04-28 DIAGNOSIS — J438 Other emphysema: Secondary | ICD-10-CM

## 2018-04-28 DIAGNOSIS — Z8249 Family history of ischemic heart disease and other diseases of the circulatory system: Secondary | ICD-10-CM

## 2018-04-28 DIAGNOSIS — C50311 Malignant neoplasm of lower-inner quadrant of right female breast: Secondary | ICD-10-CM

## 2018-04-28 DIAGNOSIS — Z79811 Long term (current) use of aromatase inhibitors: Secondary | ICD-10-CM | POA: Diagnosis not present

## 2018-04-28 DIAGNOSIS — I10 Essential (primary) hypertension: Secondary | ICD-10-CM | POA: Insufficient documentation

## 2018-04-28 DIAGNOSIS — Z79899 Other long term (current) drug therapy: Secondary | ICD-10-CM

## 2018-04-28 DIAGNOSIS — Z833 Family history of diabetes mellitus: Secondary | ICD-10-CM | POA: Insufficient documentation

## 2018-04-28 DIAGNOSIS — C50911 Malignant neoplasm of unspecified site of right female breast: Secondary | ICD-10-CM

## 2018-04-28 LAB — COMPREHENSIVE METABOLIC PANEL
ALT: 18 U/L (ref 0–44)
AST: 17 U/L (ref 15–41)
Albumin: 4.1 g/dL (ref 3.5–5.0)
Alkaline Phosphatase: 73 U/L (ref 38–126)
Anion gap: 9 (ref 5–15)
BUN: 17 mg/dL (ref 8–23)
CO2: 28 mmol/L (ref 22–32)
Calcium: 9.2 mg/dL (ref 8.9–10.3)
Chloride: 108 mmol/L (ref 98–111)
Creatinine, Ser: 1.03 mg/dL — ABNORMAL HIGH (ref 0.44–1.00)
GFR calc Af Amer: >60 mL/min (ref >60)
GFR calc non Af Amer: 54 mL/min — ABNORMAL LOW (ref >60)
Glucose, Bld: 137 mg/dL — ABNORMAL HIGH (ref 70–99)
Potassium: 4.2 mmol/L (ref 3.5–5.1)
Sodium: 145 mmol/L (ref 135–145)
Total Bilirubin: 0.6 mg/dL (ref 0.3–1.2)
Total Protein: 6.7 g/dL (ref 6.5–8.1)

## 2018-04-28 LAB — CBC WITH DIFFERENTIAL/PLATELET
Abs Immature Granulocytes: 0.02 10*3/uL (ref 0.00–0.07)
Basophils Absolute: 0.1 10*3/uL (ref 0.0–0.1)
Basophils Relative: 1 %
Eosinophils Absolute: 0.2 10*3/uL (ref 0.0–0.5)
Eosinophils Relative: 2 %
HCT: 47 % — ABNORMAL HIGH (ref 36.0–46.0)
Hemoglobin: 16.2 g/dL — ABNORMAL HIGH (ref 12.0–15.0)
Immature Granulocytes: 0 %
Lymphocytes Relative: 28 %
Lymphs Abs: 2 10*3/uL (ref 0.7–4.0)
MCH: 33.5 pg (ref 26.0–34.0)
MCHC: 34.5 g/dL (ref 30.0–36.0)
MCV: 97.1 fL (ref 80.0–100.0)
Monocytes Absolute: 0.6 10*3/uL (ref 0.1–1.0)
Monocytes Relative: 8 %
Neutro Abs: 4.4 10*3/uL (ref 1.7–7.7)
Neutrophils Relative %: 61 %
Platelets: 243 10*3/uL (ref 150–400)
RBC: 4.84 MIL/uL (ref 3.87–5.11)
RDW: 11.8 % (ref 11.5–15.5)
WBC: 7.2 10*3/uL (ref 4.0–10.5)
nRBC: 0 % (ref 0.0–0.2)

## 2018-04-28 NOTE — Telephone Encounter (Signed)
Gave avs and calendar ° °

## 2018-04-28 NOTE — Progress Notes (Signed)
CLINIC:  Survivorship   REASON FOR VISIT:  Routine follow-up post-treatment for a recent history of breast cancer.  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        INTERVAL HISTORY:  Ms. Shanley presents to the [facility] today for our initial meeting to review her survivorship care plan detailing her treatment course for breast cancer, as well as monitoring long-term side effects of that treatment, education regarding health maintenance, screening, and overall wellness and health promotion.     Overall, Ms. Ekholm reports feeling moderately well.  She is taking Anastrozole daily.  She also receives Prolia every 6 months.  She notes that since starting the Anastrozole her blood pressure is elevated.  She says that if she takes the Anastrozole daily she has elevated bp to 150s over 90s.  When she takes the Anastrozole Monday, Wednesday and Friday, her blood pressure is 130s-140s/60s.  She also notes a decrease in her energy level is decreased.  She works with a Physiological scientist two days a week.  She goes to the gym and walks 2-3 miles per day.  She notes she has to make herself remain active,  because if she sits, she will sleep for 2-3 hours.     REVIEW OF SYSTEMS:  Review of Systems  Constitutional: Negative for appetite change, chills, fatigue, fever and unexpected weight change.  HENT:   Negative for hearing loss, lump/mass, mouth sores, sore throat and trouble swallowing.   Eyes: Negative for eye problems and icterus.  Respiratory: Negative for chest tightness, cough and shortness of breath.   Cardiovascular: Negative for chest pain, leg swelling and palpitations.  Gastrointestinal: Negative for abdominal distention, abdominal pain, constipation, diarrhea, nausea and vomiting.  Endocrine: Negative for hot flashes.  Genitourinary: Negative for difficulty urinating.   Musculoskeletal: Negative for arthralgias.  Skin: Negative for itching and rash.  Neurological: Negative for dizziness, extremity weakness, headaches and numbness.  Hematological: Negative for adenopathy. Does not bruise/bleed easily.  Psychiatric/Behavioral: Negative for depression. The patient is not nervous/anxious.   Breast: Denies any new nodularity, masses, tenderness, nipple changes, or nipple discharge.      ONCOLOGY TREATMENT TEAM:  1. Surgeon:  Dr. Donne Hazel at [facility] 2. Medical Oncologist: Dr. Jana Hakim  3. Radiation Oncologist: Dr. Lisbeth Renshaw    PAST MEDICAL/SURGICAL HISTORY:  Past Medical History:  Diagnosis Date  . Breast cancer ([place]) 1995   left   . Cataract   . Complication of anesthesia   . Emphysema of lung ([place])   . Family history of breast cancer   . Hiatal hernia 1992  . Hypertension   . Hyperthyroidism   . Osteoporosis   . PONV (  postoperative nausea and vomiting)    Past Surgical History:  Procedure Laterality Date  . MASTECTOMY Left 1995  . MASTECTOMY W/ SENTINEL NODE BIOPSY Right 12/30/2017  . MASTECTOMY W/ SENTINEL NODE BIOPSY Right 12/30/2017   Procedure: MASTECTOMY WITH SENTINEL LYMPH NODE BIOPSY;  Surgeon: Rolm Bookbinder, MD;  Location: [place];   Service: General;  Laterality: Right;  . TOTAL ABDOMINAL HYSTERECTOMY  06/22/1993     ALLERGIES:  Allergies  Allergen Reactions  . Bee Venom Other (See Comments)    Unknown  . Codeine Nausea Only  . Macrobid [Nitrofurantoin Macrocrystal] Other (See Comments)    Unknown  . Morphine And Related Other (See Comments)    Unknown  . Penicillins Other (See Comments)    Unknown Has patient had a PCN reaction causing immediate rash, facial/tongue/throat swelling, SOB or lightheadedness with hypotension: Unknown Has patient had a PCN reaction causing severe rash involving mucus membranes or skin necrosis: Unknown Has patient had a PCN reaction that required hospitalization: Unknown Has patient had a PCN reaction occurring within the last 10 years: No If all of the above answers are "NO", then may proceed with Cephalosporin use.   . Tyloxapol Nausea Only and Other (See Comments)    Extreme Drugged feeling, Doesn't affect pain     CURRENT MEDICATIONS:  Outpatient Encounter Medications as of 04/28/2018  Medication Sig  . anastrozole (ARIMIDEX) 1 MG tablet Take 1 tablet (1 mg total) by mouth daily. (Patient taking differently: Take 1 mg by mouth daily. Patient takes 3 x a week d/t blood pressure)  . azithromycin (ZITHROMAX) 250 MG tablet Take 250 mg by mouth every Monday, Wednesday, and Friday.   . denosumab (PROLIA) 60 MG/ML SOSY injection Inject 60 mg into the skin every 6 (six) months.   Marland Kitchen losartan-hydrochlorothiazide (HYZAAR) 50-12.5 MG tablet Take 1 tablet by mouth daily.  Marland Kitchen SYNTHROID 75 MCG tablet Take 75 mcg by mouth daily before breakfast.   . umeclidinium-vilanterol (ANORO ELLIPTA) 62.5-25 MCG/INH AEPB Inhale 1 puff into the lungs daily.  . VENTOLIN HFA 108 (90 Base) MCG/ACT inhaler Inhale 2 puffs into the lungs every 4 (four) hours as needed for wheezing or shortness of breath.   Marland Kitchen ipratropium-albuterol (DUONEB) 0.5-2.5 (3) MG/3ML SOLN Inhale 3 mLs into the lungs See admin  instructions. Use 1 vial via nebulizer daily. May use additional vial if needed at bedtime for shortness of breath.  . OXYGEN Inhale 2 L into the lungs continuous.   No facility-administered encounter medications on file as of 04/28/2018.      ONCOLOGIC FAMILY HISTORY:  Family History  Problem Relation Age of Onset  . Aneurysm Mother   . Heart attack Father   . Diabetes Sister   . Diabetes Sister   . Breast cancer Cousin 76  . Breast cancer Cousin        20's/30's  . Breast cancer Cousin        3 of Mcousins1xremoved (mom's cousins) had breast cancer 70+     GENETIC COUNSELING/TESTING: declined  SOCIAL HISTORY:  Social History   Socioeconomic History  . Marital status: Single    Spouse name: Not on file  . Number of children: Not on file  . Years of education: Not on file  . Highest education level: Not on file  Occupational History  . Not on file  Social Needs  . Financial resource strain: Not on file  . Food insecurity:    Worry: Not on file    Inability: Not  on file  . Transportation needs:    Medical: Not on file    Non-medical: Not on file  Tobacco Use  . Smoking status: Former Smoker    Packs/day: 1.00    Years: 50.00    Pack years: 50.00    Types: Cigarettes    Last attempt to quit: 12/18/2017    Years since quitting: 0.3  . Smokeless tobacco: Never Used  Substance and Sexual Activity  . Alcohol use: Yes    Comment: Martini daily  . Drug use: Never  . Sexual activity: Not on file  Lifestyle  . Physical activity:    Days per week: Not on file    Minutes per session: Not on file  . Stress: Not on file  Relationships  . Social connections:    Talks on phone: Not on file    Gets together: Not on file    Attends religious service: Not on file    Active member of club or organization: Not on file    Attends meetings of clubs or organizations: Not on file    Relationship status: Not on file  . Intimate partner violence:    Fear of current or ex  partner: Not on file    Emotionally abused: Not on file    Physically abused: Not on file    Forced sexual activity: Not on file  Other Topics Concern  . Not on file  Social History Narrative  . Not on file     PHYSICAL EXAMINATION:  Vital Signs:   Vitals:   04/28/18 1300  BP: (!) 141/69  Pulse: 75  Resp: 18  Temp: 97.8 F (36.6 C)  SpO2: 99%   Filed Weights   04/28/18 1300  Weight: 148 lb 2 oz (67.2 kg)   General: Well-nourished, well-appearing female in no acute distress.  She is unaccompanied today.   HEENT: Head is normocephalic.  Pupils equal and reactive to light. Conjunctivae clear without exudate.  Sclerae anicteric. Oral mucosa is pink, moist.  Oropharynx is pink without lesions or erythema.  Lymph: No cervical, supraclavicular, or infraclavicular lymphadenopathy noted on palpation.  Cardiovascular: Regular rate and rhythm.Marland Kitchen Respiratory: Clear to auscultation bilaterally. Chest expansion symmetric; breathing non-labored.  Breasts: right breast s/p mastectomy, no sign of local recurrence, left breast s/p mastectomy, no sign of local recurrence GI: Abdomen soft and round; non-tender, non-distended. Bowel sounds normoactive.  GU: Deferred.  Neuro: No focal deficits. Steady gait.  Psych: Mood and affect normal and appropriate for situation.  Extremities: No edema. MSK: No focal spinal tenderness to palpation.  Full range of motion in bilateral upper extremities Skin: Warm and dry.  LABORATORY DATA:  None for this visit.  DIAGNOSTIC IMAGING:  None for this visit.      ASSESSMENT AND PLAN:  Ms.. Gitto is a pleasant 73 y.o. female with Stage IA right breast invasive ductal carcinoma, ER+/PR+/HER2-, diagnosed in 10/2017, treated with mastectomy and anti-estrogen therapy with Anastrozole beginning in 12/2017.  She presents to the Survivorship Clinic for our initial meeting and routine follow-up post-completion of treatment for breast cancer.    1. Stage IA right  breast cancer:  Ms. Mccomas is continuing to recover from definitive treatment for breast cancer. She will follow-up with her medical oncologist, Dr. Jana Hakim in 6 months with history and physical exam per surveillance protocol. She and I discussed follow up recommendations, and she notes that traveling down here really is bothersome and she prefers the least amount of appointments as possible.  She will continue her anti-estrogen therapy with Anastrozole (see #2). Today, a comprehensive survivorship care plan and treatment summary was reviewed with the patient today detailing her breast cancer diagnosis, treatment course, potential late/long-term effects of treatment, appropriate follow-up care with recommendations for the future, and patient education resources.  A copy of this summary, along with a letter will be sent to the patient's primary care provider via mail/fax/In Basket message after today's visit.    2. Hypertension: Patient notes a correlation of hypertension when taking anastrozole daily versus taking it three days a week.  She also notes that she is fatigued.  We reviewed this.  If things don't improve in another month, she will call me and let me know.  We reviewed taking a two week holiday to see if the blood pressure and fatigue improves.  We discussed that our ultimate goal is for her to take the anti estrogen therapy every day, as women who take their medications consistently have better outcomes than women who do not.  She will call me and let me know how she does.  If we do have to switch, we discussed switching to either Exemestane or Tamoxifen.  She has tolerated tamoxifen in the past, with her previous breast cancer that was in the 1990s.  3. Bone health:  Given Ms. Tews's age/history of breast cancer and her current treatment regimen including anti-estrogen therapy with Anastrozole, she is at risk for bone demineralization.  She is due for bone density in 08/2018.  She had these done  with [facility].  Dr. Nori Riis was following the bone densities.  She receives Prolia every 6 months. She was given education on specific activities to promote bone health.  4. Cancer screening:  Due to Ms. [place] history and her age, she should receive screening for skin cancers, colon cancer, and gynecologic cancers.  The information and recommendations are listed on the patient's comprehensive care plan/treatment summary and were reviewed in detail with the patient.    5. Health maintenance and wellness promotion: Ms. Schelling was encouraged to consume 5-7 servings of fruits and vegetables per day. We reviewed the "Nutrition Rainbow" handout, as well as the handout "Take Control of Your Health and Reduce Your Cancer Risk" from the [facility].  She was also encouraged to engage in moderate to vigorous exercise for 30 minutes per day most days of the week. We discussed the [employer] fitness program, which is designed for cancer survivors to help them become more physically fit after cancer treatments.  She was instructed to limit her alcohol consumption and continue to abstain from tobacco use.     6. Support services/counseling: It is not uncommon for this period of the patient's cancer care trajectory to be one of many emotions and stressors.  We discussed an opportunity for her to participate in the next session of [facility] ("Finding Your New Normal") support group series designed for patients after they have completed treatment.   Ms. Gignac was encouraged to take advantage of our many other support services programs, support groups, and/or counseling in coping with her new life as a cancer survivor after completing anti-cancer treatment.  She was offered support today through active listening and expressive supportive counseling.  She was given information regarding our available services and encouraged to contact me with any questions or for help enrolling in any of our support  group/programs.    Dispo:   -Return to cancer center for f/u with Dr. Jana Hakim in  09/2018 -Follow up with Dr. Donne Hazel in 04/2019 -She is welcome to return back to the Survivorship Clinic at any time; no additional follow-up needed at this time.  -Consider referral back to survivorship as a long-term survivor for continued surveillance  A total of (30) minutes of face-to-face time was spent with this patient with greater than 50% of that time in counseling and care-coordination.   Gardenia Phlegm, NP Survivorship Program [facility] [phone]   Note: PRIMARY CARE PROVIDER Quentin Cornwall, [place] [phone]

## 2018-05-01 ENCOUNTER — Telehealth: Payer: Self-pay | Admitting: Pulmonary Disease

## 2018-05-01 NOTE — Telephone Encounter (Signed)
Spoke with the pt  She states she has not used her o2 since Oct 2019  This was originally started by Dr Koleen Nimrod  [facility] [employer] has already came and picked up her o2  She is asking if we do have to start her back on o2, that we go ahead and have a release form ready for this  I advised that we can assess her need for o2 here at her visit next wk, and if we discover that she does need the o2 we can simply reorder this  She verbalized understanding  Nothing further needed  Will discuss at [facility]

## 2018-05-01 NOTE — Telephone Encounter (Signed)
States she has Inogen 1 and if she absolutely has to have something, she has this to use.

## 2018-05-06 ENCOUNTER — Ambulatory Visit (INDEPENDENT_AMBULATORY_CARE_PROVIDER_SITE_OTHER): Payer: Medicare Other | Admitting: Pulmonary Disease

## 2018-05-06 ENCOUNTER — Telehealth: Payer: Self-pay | Admitting: Pulmonary Disease

## 2018-05-06 ENCOUNTER — Encounter: Payer: Self-pay | Admitting: Pulmonary Disease

## 2018-05-06 VITALS — BP 152/80 | HR 71 | Ht 63.0 in | Wt 148.8 lb

## 2018-05-06 DIAGNOSIS — J438 Other emphysema: Secondary | ICD-10-CM | POA: Diagnosis not present

## 2018-05-06 MED ORDER — VENTOLIN HFA 108 (90 BASE) MCG/ACT IN AERS: 2.0000 | INHALATION_SPRAY | RESPIRATORY_TRACT | 5 refills | Status: DC | PRN

## 2018-05-06 MED ORDER — UMECLIDINIUM-VILANTEROL 62.5-25 MCG/INH IN AEPB: 1.0000 | INHALATION_SPRAY | Freq: Every day | RESPIRATORY_TRACT | 5 refills | Status: DC

## 2018-05-06 NOTE — Telephone Encounter (Signed)
Spoke with pt, I advised her that I did not have any record of the phone call. She did ask about the d/c order for oxygen and I advised her that the order was sent and received. Dr. Loanne Drilling was there anything you wanted to talk to pt about?

## 2018-05-06 NOTE — Progress Notes (Addendum)
Synopsis: Referred in 01/2018 for COPD  Subjective:   PATIENT ID: Elaine [name] GENDER: female DOB: 1945/08/23, MRN: [id number]   HPI  Chief Complaint  Patient presents with  . Follow-up    breathing is doing well, no complaints.  requesting refills on Anoro, Azithromycin, Ventolin HFA, Astelin nasal.  home O2 equipment was picked up 1 week ago   [name] is a 73 year old female with hx right breast cancer s/p mastectomy who presents for COPD follow-up.  Since our last visit in 01/2019, she reports that aside from a small cold last month, her symptoms are well-controlled. Denies shortness of breath, cough or wheezing. Has been compliant with her Anoro daily. Very rarely uses her albuterol. She remains active with her personal trainer working on mostly strength exercises and stretching. She has returned her home oxygen to her DME since her O2 levels have not dropped below 95%. Denies recent fevers, chills, chest pain, productive cough.  Social: 50 pack year history. Quit smoking 01/2018.   I have personally reviewed patient's past medical/family/social history, allergies, current medications.  Past Medical History:  Diagnosis Date  . Breast cancer ([place]) 1995   left   . Cataract   . Complication of anesthesia   . Emphysema of lung ([place])   . Family history of breast cancer   . Hiatal hernia 1992  . Hypertension   . Hyperthyroidism   . Osteoporosis   . PONV (postoperative nausea and vomiting)       Social History   Occupational History  . Not on file  Tobacco Use  . Smoking status: Former Smoker    Packs/day: 1.00    Years: 50.00    Pack years: 50.00    Types: Cigarettes    Last attempt to quit: 12/18/2017    Years since quitting: 0.3  . Smokeless tobacco: Never Used  Substance and Sexual Activity  . Alcohol use: Yes    Comment: Martini daily  . Drug use: Never  . Sexual activity: Not on file    Allergies  Allergen Reactions  . Bee Venom Other (See  Comments)    Unknown  . Codeine Nausea Only  . Macrobid [Nitrofurantoin Macrocrystal] Other (See Comments)    Unknown  . Morphine And Related Other (See Comments)    Unknown  . Penicillins Other (See Comments)    Unknown Has patient had a PCN reaction causing immediate rash, facial/tongue/throat swelling, SOB or lightheadedness with hypotension: Unknown Has patient had a PCN reaction causing severe rash involving mucus membranes or skin necrosis: Unknown Has patient had a PCN reaction that required hospitalization: Unknown Has patient had a PCN reaction occurring within the last 10 years: No If all of the above answers are "NO", then may proceed with Cephalosporin use.   . Tyloxapol Nausea Only and Other (See Comments)    Extreme Drugged feeling, Doesn't affect pain     Outpatient Medications Prior to Visit  Medication Sig Dispense Refill  . denosumab (PROLIA) 60 MG/ML SOSY injection Inject 60 mg into the skin every 6 (six) months.     Marland Kitchen losartan (COZAAR) 50 MG tablet Take 50 mg by mouth daily.    Marland Kitchen SYNTHROID 75 MCG tablet Take 75 mcg by mouth daily before breakfast.   1  . azithromycin (ZITHROMAX) 250 MG tablet Take 250 mg by mouth every Monday, Wednesday, and Friday.     . umeclidinium-vilanterol (ANORO ELLIPTA) 62.5-25 MCG/INH AEPB Inhale 1 puff into the lungs daily.    Marland Kitchen  VENTOLIN HFA 108 (90 Base) MCG/ACT inhaler Inhale 2 puffs into the lungs every 4 (four) hours as needed for wheezing or shortness of breath.   3  . anastrozole (ARIMIDEX) 1 MG tablet Take 1 tablet (1 mg total) by mouth daily. (Patient not taking: Reported on 05/06/2018) 30 tablet 3  . ipratropium-albuterol (DUONEB) 0.5-2.5 (3) MG/3ML SOLN Inhale 3 mLs into the lungs See admin instructions. Use 1 vial via nebulizer daily. May use additional vial if needed at bedtime for shortness of breath.  3  . OXYGEN Inhale 2 L into the lungs continuous.    Marland Kitchen losartan-hydrochlorothiazide (HYZAAR) 50-12.5 MG tablet Take 1 tablet by  mouth daily.  1   No facility-administered medications prior to visit.     Review of Systems  Constitutional: Negative for chills, diaphoresis, fever, malaise/fatigue and weight loss.  HENT: Positive for congestion. Negative for sore throat.   Respiratory: Positive for cough. Negative for hemoptysis, sputum production, shortness of breath and wheezing.   Cardiovascular: Negative for chest pain, orthopnea, leg swelling and PND.  Gastrointestinal: Negative for abdominal pain, heartburn and nausea.  Genitourinary: Negative for frequency.  Musculoskeletal: Positive for myalgias.  Skin: Negative for rash.  Neurological: Negative for dizziness, weakness and headaches.  Endo/Heme/Allergies: Does not bruise/bleed easily.     Objective:     Vitals:   05/06/18 1335  BP: (!) 152/80  Pulse: 71  SpO2: 95%  Weight: 148 lb 12.8 oz (67.5 kg)  Height: 5\' 3"  (1.6 m)   SpO2: 95 % O2 Device: None (Room air)  Physical Exam: General: Well-appearing, no acute distress HENT: [place], AT Eyes: EOMI, no scleral icterus Respiratory: Clear to auscultation bilaterally.  No crackles, wheezing or rales Cardiovascular: RRR, -M/R/G, no JVD GI: BS+, soft, nontender Extremities:1+pitting edema in left lower extremity,-tenderness Neuro: AAO x4, CNII-XII grossly intact Skin: Intact, no rashes or bruising Psych: Normal mood, normal affect  Chest imaging: CT Chest 07/24/17 - Severe emphysema. RML groundglass nodule 47mm CT Chest 03/09/18 - Severe emphysema. Interval resolution of ground glass nodule  PFT: None on file  Absolute Eosinophils 11/06/2017: 300  I have personally reviewed the above labs, images and tests noted above.    Assessment & Plan:  73 year old female with hx breast cancer who presents for COPD follow-up. She is well-controlled on LABA/LAMA. No exacerbations in the last three months. Reviewed CT imaging and compared to prior which demonstrated interval resolution of  GGO/nodule.  Emphysema/Chronic obstructive pulmonary disease, well controlled STOP azithromycin 250 mg every Monday, Wednesday and Friday REFILL Anoro Ellipta 62.5-25 mcg 1 puff daily REFILL albuterol inhaler 2 puffs evert 4 hours as needed for shortness of breath or wheezing PRESCRIBE/REFILL Astelin nasal spray  Tobacco Abuse Former smoker <15 years, 30 pack-years, age-appropriate  -Will defer referal to LDCT clinic before 05/2019.  Right middle lobe groundglass lung nodule, 36mm - Resolved As we discussed, this lung spot has resolved and was not seen on your CT scan in December. I would still recommend annual LDCT lung cancer screen.   Orders Placed This Encounter  Procedures  . Ambulatory Referral for DME    Referral Priority:   Routine    Referral Type:   Durable Medical Equipment Purchase    Number of Visits Requested:   1   Meds ordered this encounter  Medications  . umeclidinium-vilanterol (ANORO ELLIPTA) 62.5-25 MCG/INH AEPB    Sig: Inhale 1 puff into the lungs daily.    Dispense:  1 each    Refill:  5  . VENTOLIN HFA 108 (90 Base) MCG/ACT inhaler    Sig: Inhale 2 puffs into the lungs every 4 (four) hours as needed for wheezing or shortness of breath.    Dispense:  1 Inhaler    Refill:  5    Return in about 6 months (around 11/04/2018).  Nayel Purdy Rodman Pickle, MD [facility] 05/06/2018 2:20 PM  Personal pager: [phone] If unanswered, please page CCM On-call: [phone]

## 2018-05-06 NOTE — Patient Instructions (Addendum)
  Chronic obstructive pulmonary disease, well controlled STOP azithromycin 250 mg every Monday, Wednesday and Friday CONTINUE Anoro Ellipta 62.5-25 mcg 1 puff daily CONTINUE albuterol inhaler 2 puffs evert 4 hours as needed for shortness of breath or wheezing  Right middle lobe groundglass lung nodule, 24mm - Resolved As we discussed, this lung spot has resolved and was not seen on your CT scan in December. I would still recommend annual LDCT lung cancer screen. -Will defer referal to LDCT clinic before 05/2019.  Follow-up in 6 months

## 2018-05-07 NOTE — Telephone Encounter (Signed)
Lmtcb for pt.  

## 2018-05-07 NOTE — Telephone Encounter (Signed)
No I did not call the patient. She had a clinic visit yesterday and I addressed all questions then.

## 2018-05-07 NOTE — Telephone Encounter (Signed)
Patient returned call.  She was advised Dr. Loanne Drilling had not called her.  She had no further questions.  Call back is not required.

## 2018-05-26 ENCOUNTER — Telehealth: Payer: Self-pay | Admitting: Pulmonary Disease

## 2018-05-26 MED ORDER — PREDNISONE 20 MG PO TABS: 40.0000 mg | ORAL_TABLET | Freq: Every day | ORAL | 0 refills | Status: DC

## 2018-05-26 MED ORDER — AZITHROMYCIN 250 MG PO TABS: ORAL_TABLET | ORAL | 0 refills | Status: DC

## 2018-05-26 NOTE — Telephone Encounter (Signed)
Primary Pulmonologist: Dr Loanne Drilling Last office visit and with whom: 05/06/18, Dr Loanne Drilling What do we see them for (pulmonary problems): emphysema Last OV assessment/plan:  Instructions      Return in about 6 months (around 11/04/2018).   Chronic obstructive pulmonary disease, well controlled STOP azithromycin 250 mg every Monday, Wednesday and Friday CONTINUE Anoro Ellipta 62.5-25 mcg 1 puff daily CONTINUE albuterol inhaler 2 puffs evert 4 hours as needed for shortness of breath or wheezing  Right middle lobe groundglass lung nodule, 65mm - Resolved As we discussed, this lung spot has resolved and was not seen on your CT scan in December. I would still recommend annual LDCT lung cancer screen. -Will defer referal to LDCT clinic before 05/2019.  Follow-up in 6 months       Was appointment offered to patient (explain)?  Yes, Patient lives in [place], [place], and did not want to drive this morning.   Reason for call:  Called and spoke with Patient. She stated that she started having a sore throat Saturday.  She stated that it has gotten progressively worse.  She stated that yesterday she started having a productive cough, with clear, thick phlegm.  She took azithromycin 250mg , at 5:30pm, because she thought she was having a sinus infection.  She had a breathing treatment at 7:30pm to help with her cough.  She stated that she felt like her throat was closing up on her, so she took 5mg  of prednisone, and that helped her breathing, and throat.  She has been taking astelin nasal spray, that is not helping. She request prescriptions to be sent to [place], [place].

## 2018-05-26 NOTE — Telephone Encounter (Signed)
Called pt and advised message from the provider. Pt understood and verbalized understanding. Nothing further is needed.   Rx's for Zpak and Prednisone sent to the pharmacy.

## 2018-05-26 NOTE — Telephone Encounter (Signed)
Please send prescription for prednisone 40 mg x 5 days and Zpak for COPD exacerbation.

## 2018-06-18 NOTE — Telephone Encounter (Signed)
Dr. Loanne Drilling please advise on patients e-mail above. Thank you.

## 2018-06-22 ENCOUNTER — Telehealth: Payer: Self-pay | Admitting: Pulmonary Disease

## 2018-06-22 NOTE — Telephone Encounter (Signed)
Pulmonary Telephone Encounter  Spoke with: Elaine [name]  We discussed her current symptoms of cough and nasal sinus congestion. She has started loratadine which has helped her nasal congestion and drainage significant. Her cough has improved as well. She was unable to tolerate Prednisone 40 mg as she felt this dropped her oxygen to 88-89%. We discussed that if she were to have exacerbation in the future, we would consider low-dose steroids 10-20 mg to see if this is better tolerated.  Recommendations: Restart azithromycin 250 mg M-W-F for anti-inflammatory effect Advised patient to restart oxygen for O2 saturations <88% for >5 minutes Continue bronchodilators Advised routine precautions including proper handwashing, avoidance of sick contacts and minimizing travel at this time Please call our office for any worsening symptoms and we can arrange face-to-face, telephone or televisit.  Rodman Pickle, M.D. [facility] Pulmonary/Critical Care Medicine Pager: [phone] After hours pager: [phone]

## 2018-08-06 DIAGNOSIS — J438 Other emphysema: Secondary | ICD-10-CM

## 2018-08-07 MED ORDER — UMECLIDINIUM-VILANTEROL 62.5-25 MCG/INH IN AEPB: 1.0000 | INHALATION_SPRAY | Freq: Every day | RESPIRATORY_TRACT | 3 refills | Status: DC

## 2018-08-11 ENCOUNTER — Other Ambulatory Visit: Payer: Self-pay

## 2018-08-11 ENCOUNTER — Ambulatory Visit (INDEPENDENT_AMBULATORY_CARE_PROVIDER_SITE_OTHER): Payer: Medicare Other | Admitting: Adult Health

## 2018-08-11 ENCOUNTER — Encounter: Payer: Self-pay | Admitting: Adult Health

## 2018-08-11 DIAGNOSIS — J449 Chronic obstructive pulmonary disease, unspecified: Secondary | ICD-10-CM | POA: Diagnosis not present

## 2018-08-11 DIAGNOSIS — J9611 Chronic respiratory failure with hypoxia: Secondary | ICD-10-CM

## 2018-08-11 NOTE — Progress Notes (Signed)
Virtual Visit via Telephone Note  I connected with Elaine [name] on 45/80/99 at  2:00 PM EDT by telephone and verified that I am speaking with the correct person using two identifiers.  Location: Patient: Home Provider: Office   I discussed the limitations, risks, security and privacy concerns of performing an evaluation and management service by telephone and the availability of in person appointments. I also discussed with the patient that there may be a patient responsible charge related to this service. The patient expressed understanding and agreed to proceed.   History of Present Illness: 73 year old female former smoker followed for COPD. Medical history significant for right breast cancer status post mastectomy  Today's tele-visit is a 18-month follow-up for COPD Patient says overall she is doing well feels that her breathing has improved since last visit.  She was restarted back on azithromycin 3 days a week.  She takes this on Monday Wednesday and Friday.  She remains on Anoro daily.  She says that this has really helped her she has had less cough congestion.  Feels that her activity tolerance has improved.  As she has had no exacerbations since last visit.  She has no longer using oxygen at home.  She does have a portable oxygen concentrator [employer] but has not had to use this.  She checks her O2 saturations with a pulse oximeter at home and says that her oxygen levels have been above 90%. She denies any cough congestion chest pain orthopnea PND or increased leg swelling.  Observations/Objective: CT Chest 07/24/17 - Severe emphysema. RML groundglass nodule 53mm CT Chest 03/09/18 - Severe emphysema. Interval resolution of ground glass nodule   Assessment and Plan: COPD currently stable seems to have better symptom control and less exacerbations on current regimen with daily Anoro and azithromycin 3 days a weekly  Former smoker-plan for low-dose CT screening and 2021  Lung nodule  right middle lobe.  Resolved on recent CT chest December 2019  Plan  Patient Instructions  Continue on ANORO 1 puff daily , rinse after use.  May use Oxygen 2l/m with activity as needed , goal is to keep O2 sats >88-90%.  Continue on Azithromycin three days a week.  Follow up with Dr. Loanne Drilling in 3-4 months and As needed   Labs as planned.  Please contact office for sooner follow up if symptoms do not improve or worsen or seek emergency care       Follow Up Instructions: Follow up with DR. Loanne Drilling in 3-4 months and As needed   Please contact office for sooner follow up if symptoms do not improve or worsen or seek emergency care     I discussed the assessment and treatment plan with the patient. The patient was provided an opportunity to ask questions and all were answered. The patient agreed with the plan and demonstrated an understanding of the instructions.   The patient was advised to call back or seek an in-person evaluation if the symptoms worsen or if the condition fails to improve as anticipated.  I provided 22  minutes of non-face-to-face time during this encounter.   Rexene Edison, NP

## 2018-08-11 NOTE — Patient Instructions (Addendum)
Continue on ANORO 1 puff daily , rinse after use.  May use Oxygen 2l/m with activity as needed , goal is to keep O2 sats >88-90%.  Continue on Azithromycin three days a week.  Follow up with Dr. Loanne Drilling in 3-4 months and As needed   Labs as planned.  Please contact office for sooner follow up if symptoms do not improve or worsen or seek emergency care

## 2018-08-21 ENCOUNTER — Telehealth: Payer: Self-pay | Admitting: Adult Health

## 2018-08-21 ENCOUNTER — Other Ambulatory Visit: Payer: Self-pay | Admitting: Pulmonary Disease

## 2018-08-21 MED ORDER — AZITHROMYCIN 250 MG PO TABS: 250.0000 mg | ORAL_TABLET | ORAL | 0 refills | Status: DC

## 2018-08-21 NOTE — Telephone Encounter (Signed)
Call returned to patient, she states per her last office visit she is to continue azithromycin three times a week. Made her aware the AVS does reflect this information. Confirmed pharmacy. Medication sent in. Voiced understanding. Nothing further needed at this time.

## 2018-09-17 ENCOUNTER — Encounter: Payer: Self-pay | Admitting: Oncology

## 2018-09-18 ENCOUNTER — Telehealth: Payer: Self-pay

## 2018-09-18 NOTE — Telephone Encounter (Signed)
Pt called and cancelled appts for July to be rescheduled in late Aug or early Sept.  Msg has been sent to schedulers to call and get her rescheduled.

## 2018-09-22 ENCOUNTER — Telehealth: Payer: Self-pay | Admitting: Oncology

## 2018-09-22 ENCOUNTER — Encounter: Payer: Self-pay | Admitting: Oncology

## 2018-09-22 NOTE — Telephone Encounter (Signed)
Patient called and moved rescheduled 8/31 lab/fu to October. Per patient with her health she does not want to come until after August.

## 2018-09-22 NOTE — Telephone Encounter (Signed)
Patient would like to reschedule.

## 2018-09-22 NOTE — Telephone Encounter (Signed)
R/s appt per 6/19 sch message- called both numbers - unable to reach pt . Left message on cell number.

## 2018-09-22 NOTE — Telephone Encounter (Signed)
Confirmed current home and mobile phone numbers are correct before transfer.  "I received a message from a Jana Half about appointment AFTER the appointment had been changed.  No one asked me before making the change."

## 2018-10-26 ENCOUNTER — Ambulatory Visit: Payer: Medicare Other | Admitting: Oncology

## 2018-10-26 ENCOUNTER — Other Ambulatory Visit: Payer: Medicare Other

## 2018-11-30 ENCOUNTER — Ambulatory Visit: Payer: Medicare Other | Admitting: Oncology

## 2018-11-30 ENCOUNTER — Other Ambulatory Visit: Payer: Medicare Other

## 2018-12-30 ENCOUNTER — Telehealth: Payer: Self-pay | Admitting: *Deleted

## 2018-12-30 NOTE — Telephone Encounter (Signed)
Received call from pt stating she wanted to cancel her apts on 10/6.  Pt states she does not feel comfortable going out in public with covid.  RN educated pt that Dr. Jana Hakim does do virtual visits.  Pt states she will call the office back this week to set up a virtual apt.

## 2019-01-05 ENCOUNTER — Inpatient Hospital Stay: Payer: Medicare Other

## 2019-01-05 ENCOUNTER — Inpatient Hospital Stay: Payer: Medicare Other | Admitting: Oncology

## 2019-03-17 ENCOUNTER — Telehealth (INDEPENDENT_AMBULATORY_CARE_PROVIDER_SITE_OTHER): Payer: Medicare Other | Admitting: Pulmonary Disease

## 2019-03-17 ENCOUNTER — Encounter: Payer: Self-pay | Admitting: Pulmonary Disease

## 2019-03-17 DIAGNOSIS — J449 Chronic obstructive pulmonary disease, unspecified: Secondary | ICD-10-CM | POA: Diagnosis not present

## 2019-03-17 DIAGNOSIS — J432 Centrilobular emphysema: Secondary | ICD-10-CM

## 2019-03-17 MED ORDER — ASMANEX HFA 100 MCG/ACT IN AERO: 2.0000 | INHALATION_SPRAY | RESPIRATORY_TRACT | 0 refills | Status: DC | PRN

## 2019-03-17 MED ORDER — ANORO ELLIPTA 62.5-25 MCG/INH IN AEPB: 1.0000 | INHALATION_SPRAY | Freq: Every day | RESPIRATORY_TRACT | 4 refills | Status: DC

## 2019-03-17 MED ORDER — AZITHROMYCIN 250 MG PO TABS: 250.0000 mg | ORAL_TABLET | ORAL | 0 refills | Status: DC

## 2019-03-17 NOTE — Progress Notes (Signed)
Synopsis: Referred in 01/2018 for COPD  Subjective:   PATIENT ID: Elaine [name] GENDER: female DOB: 12-18-1945, MRN: [id number]   HPI  Chief Complaint  Patient presents with  . Follow-up    restarted azithromycin three days a week 1 week ago- HS throat feels like it is closing then started prednisone 10 mg, and now on 5 mg    Elaine [name] is a 73 year-old female with hx right breast cancer s/p mastectomy who presents for COPD follow-up.  Overall, she has been well-controlled. However in the last week, she has had nocturnal cough with clear sputum and dyspnea. No wheezing. She started using her albuterol twice a day. She believes this is related to the cold weather changes as she has restricted her exposure outside. She restarted her azithromycin MWF and self-started prednisone for the last two weeks. Her symptoms have improved with mild dyspnea on exertion. Her nadir oxygen when she was feeling ill was 88% but now is currently ranging from 94-96%. She is compliant with her Anoro. She still feels like she has symptoms though mild.  Social: 50 pack year history. Quit smoking 01/2018.   I have personally reviewed patient's past medical/family/social history/allergies/current medications.  Past Medical History:  Diagnosis Date  . Breast cancer ([place]) 1995   left   . Cataract   . Complication of anesthesia   . Emphysema of lung ([place])   . Family history of breast cancer   . Hiatal hernia 1992  . Hypertension   . Hyperthyroidism   . Osteoporosis   . PONV (postoperative nausea and vomiting)       Social History   Occupational History  . Not on file  Tobacco Use  . Smoking status: Former Smoker    Packs/day: 1.00    Years: 50.00    Pack years: 50.00    Types: Cigarettes    Quit date: 12/18/2017    Years since quitting: 1.2  . Smokeless tobacco: Never Used  Substance and Sexual Activity  . Alcohol use: Yes    Comment: Martini daily  . Drug use: Never  . Sexual  activity: Not on file    Allergies  Allergen Reactions  . Bee Venom Other (See Comments)    Unknown  . Codeine Nausea Only  . Macrobid [Nitrofurantoin Macrocrystal] Other (See Comments)    Unknown  . Morphine And Related Other (See Comments)    Unknown  . Penicillins Other (See Comments)    Unknown Has patient had a PCN reaction causing immediate rash, facial/tongue/throat swelling, SOB or lightheadedness with hypotension: Unknown Has patient had a PCN reaction causing severe rash involving mucus membranes or skin necrosis: Unknown Has patient had a PCN reaction that required hospitalization: Unknown Has patient had a PCN reaction occurring within the last 10 years: No If all of the above answers are "NO", then may proceed with Cephalosporin use.   . Tyloxapol Nausea Only and Other (See Comments)    Extreme Drugged feeling, Doesn't affect pain     Outpatient Medications Prior to Visit  Medication Sig Dispense Refill  . anastrozole (ARIMIDEX) 1 MG tablet Take 1 tablet (1 mg total) by mouth daily. 30 tablet 3  . azithromycin (ZITHROMAX) 250 MG tablet Take 1 tablet (250 mg total) by mouth 3 (three) times a week. Monday, Wednesday, Friday. 36 tablet 0  . denosumab (PROLIA) 60 MG/ML SOSY injection Inject 60 mg into the skin every 6 (six) months.     Marland Kitchen ipratropium-albuterol (DUONEB)  0.5-2.5 (3) MG/3ML SOLN Inhale 3 mLs into the lungs See admin instructions. Use 1 vial via nebulizer daily. May use additional vial if needed at bedtime for shortness of breath.  3  . losartan (COZAAR) 50 MG tablet Take 50 mg by mouth daily.    . OXYGEN Inhale 2 L into the lungs continuous.    . rosuvastatin (CRESTOR) 5 MG tablet Take 5 mg by mouth daily.    Marland Kitchen SYNTHROID 75 MCG tablet Take 75 mcg by mouth daily before breakfast.   1  . umeclidinium-vilanterol (ANORO ELLIPTA) 62.5-25 MCG/INH AEPB Inhale 1 puff into the lungs daily. 1 each 3  . VENTOLIN HFA 108 (90 Base) MCG/ACT inhaler Inhale 2 puffs into the  lungs every 4 (four) hours as needed for wheezing or shortness of breath. 1 Inhaler 5  . azithromycin (ZITHROMAX) 250 MG tablet Take 2 pills today then one a day for 4 additional days 6 tablet 0   No facility-administered medications prior to visit.    Review of Systems  Constitutional: Negative for chills, diaphoresis, fever, malaise/fatigue and weight loss.  HENT: Negative for congestion.   Respiratory: Positive for shortness of breath. Negative for cough, hemoptysis, sputum production and wheezing.   Cardiovascular: Negative for chest pain, palpitations and leg swelling.   Objective:    There were no vitals filed for this visit.   SpO2 94-96% on RA  Physical Exam: General: Well-appearing, no acute distress HENT: [place], AT Eyes: EOMI, no scleral icterus Respiratory: No respiratory distress or tachypnea Neuro: AAO x4, CNII-XII grossly intact Skin: Intact, no rashes or bruising Psych: Normal mood, normal affect  Chest imaging: CT Chest 07/24/17 - Severe emphysema. RML groundglass nodule 63mm CT Chest 03/09/18 - Severe emphysema. Interval resolution of ground glass nodule  PFT:  09/27/18 FVC 3.13 (120%) FEV1 1.84 (99%) Ratio 71  TLC 92% DLCO 42% Interpretation: Normal spirometry  Absolute Eosinophils 11/06/2017: 300  Imaging, labs and test noted above have been reviewed independently by me.    Assessment & Plan:  73 year old female with history of breast cancer who presents for COPD follow-up. Recent exacerbation in December. Resolving but continues to have dyspnea on exertion after 1-2 weeks out.  Emphysema/COPD - well-controlled REFILL azithromycin 250 mg every Monday, Wednesday and Friday REFILL Anoro Ellipta 62.5-25 mcg 1 puff daily We have written a prescription for a steroid inhaler called Asmanex which can be used as needed when your respiratory symptoms.      On your sick days, take TWO puffs as needed up to twice a day  Tobacco Abuse Former smoker <15 years, 30  pack-years, age-appropriate  Low dose lung nodule CT follow-up  No orders of the defined types were placed in this encounter.  Meds ordered this encounter  Medications  . umeclidinium-vilanterol (ANORO ELLIPTA) 62.5-25 MCG/INH AEPB    Sig: Inhale 1 puff into the lungs daily.    Dispense:  3 each    Refill:  4    Please provide pt w/ 90 day supply of medication.  Marland Kitchen azithromycin (ZITHROMAX) 250 MG tablet    Sig: Take 1 tablet (250 mg total) by mouth every Monday, Wednesday, and Friday.    Dispense:  39 tablet    Refill:  0  . Mometasone Furoate (ASMANEX HFA) 100 MCG/ACT AERO    Sig: Inhale 2 puffs into the lungs as needed.    Dispense:  13 g    Refill:  0   Return in about 6 months (around  09/15/2019).  Ysidro Ramsay Rodman Pickle, MD [facility] 03/17/2019 10:39 AM

## 2019-03-17 NOTE — Patient Instructions (Addendum)
Emphysema/COPD - well-controlled REFILL azithromycin 250 mg every Monday, Wednesday and Friday REFILL Anoro Ellipta 62.5-25 mcg 1 puff daily We have written a prescription for a steroid inhaler called Asmanex which can be used as needed when your respiratory symptoms.      On your sick days, take TWO puffs as needed up to twice a day  Tobacco Abuse Former smoker <15 years, 30 pack-years, age-appropriate  Low dose lung nodule CT follow-up  Follow-up in 6 months

## 2019-03-19 NOTE — Telephone Encounter (Signed)
Received a fax from [employer] with a list of covered alternatives. Covered medications include: budesonide 0.25mg /79mL, budesonide 0.5mg /22mL, Flovent diskus and HFA, Pulmicort Flexhaler and Arnuity Ellipta.

## 2019-03-21 MED ORDER — FLOVENT HFA 110 MCG/ACT IN AERO: 2.0000 | INHALATION_SPRAY | Freq: Two times a day (BID) | RESPIRATORY_TRACT | 2 refills | Status: DC

## 2019-03-22 ENCOUNTER — Ambulatory Visit (HOSPITAL_COMMUNITY): Payer: Medicare Other

## 2019-04-14 NOTE — Telephone Encounter (Signed)
No, please request physical CD and report to be sent to office. -JE

## 2019-04-14 NOTE — Telephone Encounter (Signed)
I have called [facility] at [phone] and spoke with Maudie Mercury  She will mail copy of the disc and report to [place]  Will forward to Lauren to keep an eye out for this  Pt notified we are working on this

## 2019-04-21 ENCOUNTER — Other Ambulatory Visit: Payer: Self-pay | Admitting: Pulmonary Disease

## 2019-05-03 NOTE — Telephone Encounter (Signed)
[facility] to follow up on the request from 04/15/2019.  Spoke with the receptionist, she stated that the CT disk was mailed on 04/14/2019 from their processing center in [place]. She verified our address, it was sent to the correct address. She stated that due to the post office being behind, it may be held up at a post office. Will continue to wait for disk.

## 2019-05-24 NOTE — Telephone Encounter (Signed)
Dr. Loanne Drilling please advise if you ever received pt's CT disk from 03/24/2019.  This was being mailed from [facility].  Thanks!

## 2019-05-25 NOTE — Telephone Encounter (Signed)
I called patient and let her know disc has been received. This is her routine CT lung screen. I will review once I return to clinic in March. Patient aware and ok with plan. She does request to follow-up with me later in the year in August. She is currently stable on Anoro daily and Flovent 117mcg 2 puffs once a day. No rescue inhaler needed. She is also on chronic azithro therapy. We have previously discussed a trial off therapy during the summer which she will attempt later this year. I think this is fine and advised her to restart if she develops worsening symptoms.  Plan Please push patient's follow-up from June to August 2021 with me.  Rodman Pickle, M.D. [facility] Pulmonary/Critical Care Medicine 05/25/2019 6:21 PM

## 2019-05-26 NOTE — Telephone Encounter (Signed)
New recall placed for August 2021

## 2019-06-07 ENCOUNTER — Telehealth: Payer: Self-pay | Admitting: Pulmonary Disease

## 2019-06-07 NOTE — Telephone Encounter (Signed)
[facility] Telephone Call  Reviewed CT Chest from 03/09/18. Severe centrilobular emphysema. No evidence of new nodules. Updated patient on results. Recommend continuing annual CT lung screen. Patient agreed to plan.  Will upload disc and report.  Rodman Pickle, M.D. [facility] Pulmonary/Critical Care Medicine 06/07/2019 5:54 PM

## 2019-06-18 ENCOUNTER — Other Ambulatory Visit: Payer: Self-pay | Admitting: Pulmonary Disease

## 2019-10-28 ENCOUNTER — Other Ambulatory Visit: Payer: Self-pay | Admitting: Pulmonary Disease

## 2019-10-28 ENCOUNTER — Ambulatory Visit
Admission: RE | Admit: 2019-10-28 | Discharge: 2019-10-28 | Disposition: A | Discharge status: Home / Self Care | Payer: Self-pay | Source: Ambulatory Visit | Attending: Pulmonary Disease | Admitting: Pulmonary Disease

## 2019-10-28 DIAGNOSIS — J438 Other emphysema: Secondary | ICD-10-CM

## 2019-11-06 ENCOUNTER — Other Ambulatory Visit: Payer: Self-pay | Admitting: Pulmonary Disease

## 2019-11-09 ENCOUNTER — Other Ambulatory Visit: Payer: Self-pay | Admitting: *Deleted

## 2019-11-09 NOTE — Telephone Encounter (Signed)
Good morning, Please see patient comment about wanting a 90 day supply of the prednisone as it costs the same as a 30 day supply.  Please advise.  Thank you.

## 2019-11-10 NOTE — Telephone Encounter (Signed)
I believe you meant to say Flovent refill instead of prednisone. Ok to refill 90 day supply flovent for patient at her preferred pharmacy. -JE

## 2019-11-25 ENCOUNTER — Encounter: Payer: Self-pay | Admitting: Pulmonary Disease

## 2019-11-25 ENCOUNTER — Other Ambulatory Visit: Payer: Self-pay

## 2019-11-25 ENCOUNTER — Other Ambulatory Visit: Payer: Self-pay | Admitting: Pulmonary Disease

## 2019-11-25 ENCOUNTER — Ambulatory Visit (INDEPENDENT_AMBULATORY_CARE_PROVIDER_SITE_OTHER): Payer: Medicare Other | Admitting: Pulmonary Disease

## 2019-11-25 VITALS — BP 138/76 | HR 69 | Temp 97.8°F | Ht 63.0 in | Wt 145.0 lb

## 2019-11-25 DIAGNOSIS — J449 Chronic obstructive pulmonary disease, unspecified: Secondary | ICD-10-CM

## 2019-11-25 DIAGNOSIS — R609 Edema, unspecified: Secondary | ICD-10-CM

## 2019-11-25 DIAGNOSIS — Z87891 Personal history of nicotine dependence: Secondary | ICD-10-CM

## 2019-11-25 MED ORDER — AZITHROMYCIN 250 MG PO TABS
250.0000 mg | ORAL_TABLET | ORAL | 0 refills | Status: DC
Start: 2019-11-26 — End: 2020-01-25

## 2019-11-25 MED ORDER — FLOVENT HFA 110 MCG/ACT IN AERO
INHALATION_SPRAY | RESPIRATORY_TRACT | 3 refills | Status: DC
Start: 2019-11-25 — End: 2020-09-18

## 2019-11-25 MED ORDER — IPRATROPIUM-ALBUTEROL 0.5-2.5 (3) MG/3ML IN SOLN
3.0000 mL | RESPIRATORY_TRACT | 3 refills | Status: DC
Start: 2019-11-25 — End: 2019-11-30

## 2019-11-25 NOTE — Patient Instructions (Addendum)
Emphysema/COPD - well-controlled CONTINUE azithromycin 250 mg every Monday, Wednesday and Friday CONTINUE Anoro Ellipta 62.5-25 mcg 1 puff daily REFILL nebulizer as needed for shortness of breath or wheezing  We have written a prescription for FLOVENT which can be used as needed for your respiratory symptoms.      On your sick days, take TWO puffs as needed up to twice a day  Low dose lung nodule CT follow-up in [place]- order for December 2021  Follow-up in 6 months with me

## 2019-11-25 NOTE — Progress Notes (Signed)
Synopsis: Referred in 01/2018 for COPD  Subjective:   PATIENT ID: Elaine [name] GENDER: female DOB: 01-19-1946, MRN: [id number]   HPI  Chief Complaint  Patient presents with  . Follow-up    recent bronchial infection (7/21)   Elaine [name] is a 74 year-old female with hx right breast cancer s/p mastectomy who presents for COPD follow-up.  Since our last visit, she had a recent exacerbation in July after exercising with very cold air conditioning. She presented to [facility] (Urgent Care). Reviewed labs including CBC and CMP wnl troponin 0.01, BNP 23, D-dimer 0.03. COVID negative. She was treated with COPD exacerbation with prednisone and azithromycin.   Other than this event, she feels her COPD is well-controlled. She is compliant with her Anoro. Uses albuterol prior to activity. Compliant with her azithromycin.  Reports new onset lower extremity swelling. May worsen at the end of the day.   Social: 50 pack year history. Quit smoking 01/2018. Restarted smoking 3-4 cigarettes   I have personally reviewed patient's past medical/family/social history/allergies/current medications.  Past Medical History:  Diagnosis Date  . Breast cancer ([place]) 1995   left   . Cataract   . Complication of anesthesia   . Emphysema of lung ([place])   . Family history of breast cancer   . Hiatal hernia 1992  . Hypertension   . Hyperthyroidism   . Osteoporosis   . PONV (postoperative nausea and vomiting)       Social History   Occupational History  . Not on file  Tobacco Use  . Smoking status: Current Every Day Smoker    Packs/day: 1.00    Years: 50.00    Pack years: 50.00    Types: Cigarettes    Last attempt to quit: 12/18/2017    Years since quitting: 1.9  . Smokeless tobacco: Never Used  . Tobacco comment: 3-4 cigarettes per day--11/25/2019  Vaping Use  . Vaping Use: Never used  Substance and Sexual Activity  . Alcohol use: Yes    Comment: Martini daily  . Drug use: Never  . Sexual  activity: Not on file    Allergies  Allergen Reactions  . Bee Venom Other (See Comments)    Unknown  . Codeine Nausea Only  . Macrobid [Nitrofurantoin Macrocrystal] Other (See Comments)    Unknown  . Morphine And Related Other (See Comments)    Unknown  . Oxycodone Nausea Only  . Penicillins Other (See Comments)    Unknown Has patient had a PCN reaction causing immediate rash, facial/tongue/throat swelling, SOB or lightheadedness with hypotension: Unknown Has patient had a PCN reaction causing severe rash involving mucus membranes or skin necrosis: Unknown Has patient had a PCN reaction that required hospitalization: Unknown Has patient had a PCN reaction occurring within the last 10 years: No If all of the above answers are "NO", then may proceed with Cephalosporin use.   . Tyloxapol Nausea Only and Other (See Comments)    Extreme Drugged feeling, Doesn't affect pain     Outpatient Medications Prior to Visit  Medication Sig Dispense Refill  . anastrozole (ARIMIDEX) 1 MG tablet Take 1 tablet (1 mg total) by mouth daily. 30 tablet 3  . denosumab (PROLIA) 60 MG/ML SOSY injection Inject 60 mg into the skin every 6 (six) months.     Marland Kitchen losartan (COZAAR) 50 MG tablet Take 50 mg by mouth daily.    . Mometasone Furoate (ASMANEX HFA) 100 MCG/ACT AERO Inhale 2 puffs into the lungs as needed.  13 g 0  . rosuvastatin (CRESTOR) 5 MG tablet Take 5 mg by mouth daily.    Marland Kitchen SYNTHROID 75 MCG tablet Take 75 mcg by mouth daily before breakfast.   1  . umeclidinium-vilanterol (ANORO ELLIPTA) 62.5-25 MCG/INH AEPB Inhale 1 puff into the lungs daily. 3 each 4  . VENTOLIN HFA 108 (90 Base) MCG/ACT inhaler INHALE 2 PUFFS INTO THE LUNGS EVERY 4 (FOUR) HOURS AS NEEDED FOR WHEEZING OR SHORTNESS OF BREATH. 18 g 5  . FLOVENT HFA 110 MCG/ACT inhaler TAKE 2 PUFFS BY MOUTH TWICE A DAY (Patient taking differently: 2 puffs at bedtime. ) 12 g 1  . ipratropium-albuterol (DUONEB) 0.5-2.5 (3) MG/3ML SOLN Inhale 3 mLs  into the lungs See admin instructions. Use 1 vial via nebulizer daily. May use additional vial if needed at bedtime for shortness of breath.  3  . OXYGEN Inhale 2 L into the lungs continuous. (Patient not taking: Reported on 11/25/2019)    . azithromycin (ZITHROMAX) 250 MG tablet Take 1 tablet (250 mg total) by mouth 3 (three) times a week. Monday, Wednesday, Friday. (Patient not taking: Reported on 11/25/2019) 36 tablet 0  . azithromycin (ZITHROMAX) 250 MG tablet Take 1 tablet (250 mg total) by mouth every Monday, Wednesday, and Friday. (Patient not taking: Reported on 11/25/2019) 39 tablet 0   No facility-administered medications prior to visit.    Review of Systems  Constitutional: Negative for chills, diaphoresis, fever, malaise/fatigue and weight loss.  HENT: Negative for congestion.   Respiratory: Positive for shortness of breath. Negative for cough, hemoptysis, sputum production and wheezing.   Cardiovascular: Negative for chest pain, palpitations and leg swelling.   Objective:    Vitals:   11/25/19 1113  BP: 138/76  Pulse: 69  Temp: 97.8 F (36.6 C)  TempSrc: Other (Comment)  SpO2: 96%  Weight: 145 lb (65.8 kg)  Height: 5\' 3"  (1.6 m)   SpO2: 96 % (room air) O2 Device: None (Room air)  Physical Exam: General: Elderly-appearing, no acute distress HENT: [place], AT Eyes: EOMI, no scleral icterus Respiratory: Clear to auscultation bilaterally.  No crackles, wheezing or rales Cardiovascular: RRR, -M/R/G, no JVD GI: BS+, soft, nontender Extremities:1+ nonpitting edema in lower extremities,-tenderness Neuro: AAO x4, CNII-XII grossly intact Skin: Intact, no rashes or bruising Psych: Normal mood, normal affect  Chest imaging: CT Chest 07/24/17 - Severe emphysema. RML groundglass nodule 51mm CT Chest 03/09/18 - Severe emphysema. Interval resolution of ground glass nodule  PFT:  09/27/18 FVC 3.13 (120%) FEV1 1.84 (99%) Ratio 71  TLC 92% DLCO 42% Interpretation: Normal  spirometry  Absolute Eosinophils 11/06/2017: 300  Imaging, labs and test noted above have been reviewed independently by me.    Assessment & Plan:   Emphysema/COPD - well-controlled CONTINUE azithromycin 250 mg every Monday, Wednesday and Friday CONTINUE Anoro Ellipta 62.5-25 mcg 1 puff daily REFILL nebulizer as needed for shortness of breath or wheezing  We have written a prescription for FLOVENT which can be used as needed for your respiratory symptoms.      On your sick days, take TWO puffs as needed up to twice a day  Lower extremity swelling - new problem LE dopplers ordered  Tobacco Abuse Former smoker <15 years, 30 pack-years, age-appropriate  Low dose lung nodule CT follow-up in [place]- order for December 2021  Orders Placed This Encounter  Procedures  . CT CHEST LUNG CANCER SCREENING LOW DOSE WO CONTRAST    Standing Status:   Future    Standing Expiration Date:  11/24/2020    Scheduling Instructions:     Patient requests to be done in [place]           Please schedule to be done in December 2021    Order Specific Question:   Preferred Imaging Location?    Answer:   External    Order Specific Question:   Radiology Contrast Protocol - do NOT remove file path    Answer:   [email or web address]   Meds ordered this encounter  Medications  . ipratropium-albuterol (DUONEB) 0.5-2.5 (3) MG/3ML SOLN    Sig: Inhale 3 mLs into the lungs See admin instructions. Use 1 vial via nebulizer daily. May use additional vial if needed at bedtime for shortness of breath.    Dispense:  360 mL    Refill:  3  . fluticasone (FLOVENT HFA) 110 MCG/ACT inhaler    Sig: TAKE 2 PUFFS BY MOUTH TWICE A DAY    Dispense:  3 each    Refill:  3    Please provide pt w/ 90 day supply of medication.  Marland Kitchen azithromycin (ZITHROMAX) 250 MG tablet    Sig: Take 1 tablet (250 mg total) by mouth every Monday, Wednesday, and Friday.    Dispense:  39 tablet    Refill:  0   Return in  about 6 months (around 05/27/2020).  Luisfernando Brightwell Rodman Pickle, MD [facility] Critical [facility] 11/25/2019 12:01 PM

## 2019-11-29 ENCOUNTER — Other Ambulatory Visit: Payer: Self-pay

## 2019-11-29 ENCOUNTER — Ambulatory Visit (HOSPITAL_COMMUNITY)
Admission: RE | Admit: 2019-11-29 | Discharge: 2019-11-29 | Disposition: A | Discharge status: Home / Self Care | Payer: Medicare Other | Source: Ambulatory Visit | Attending: Cardiology | Admitting: Cardiology

## 2019-11-29 DIAGNOSIS — R609 Edema, unspecified: Secondary | ICD-10-CM

## 2019-11-30 MED ORDER — IPRATROPIUM-ALBUTEROL 0.5-2.5 (3) MG/3ML IN SOLN: 3.0000 mL | RESPIRATORY_TRACT | 3 refills | Status: DC

## 2019-12-01 NOTE — Progress Notes (Signed)
Called and spoke with patient about [place] result per Dr Loanne Drilling. All questions answered and patient expressed full understanding. Nothing further needed at this time.

## 2020-01-24 ENCOUNTER — Other Ambulatory Visit: Payer: Self-pay | Admitting: Pulmonary Disease

## 2020-01-25 NOTE — Telephone Encounter (Addendum)
Pt is requesting refill on AZITHROMYCIN 250 MG TABLET  next ov 05/23/20. Pt was called left vm z pak was sent to [facility] pharmacy in [place]

## 2020-02-23 ENCOUNTER — Telehealth: Payer: Self-pay | Admitting: Pulmonary Disease

## 2020-02-23 NOTE — Telephone Encounter (Signed)
Called and spoke to [place] with [place] and was advised the form is not complete. There are 'boxes' that show smoking history and other inclusive criteria that need to be completed.   Will forward to [facility] to complete the form and refax. Caryl Pina will then be able to schedule CT.

## 2020-02-23 NOTE — Telephone Encounter (Signed)
Forwarding message to [facility] that faxed form. Let me know if I can help with any questions.

## 2020-02-23 NOTE — Telephone Encounter (Signed)
Elaine [name] helped me to fill out this form I will fax back with the order

## 2020-02-25 NOTE — Telephone Encounter (Signed)
PCC's- can you please reschedule her CT? Please see her email:  I received a call from [facility] hospital to schedule my scan as the imaging center here can't do it because of medicare.  I would prefer not to go there!  I remember that I was once told I could have it done either in [place], [place] or [place] - can remember which.   Please let me know as that is closer than coming to [place], [place] Thank you! Elaine [name] dob 17-Dec-2045

## 2020-03-02 NOTE — Telephone Encounter (Signed)
I have scheduled this and the patient is aware

## 2020-03-28 NOTE — Telephone Encounter (Signed)
Dr. Everardo All please advise on patient mychart message     I promised to inform you should anything change.  I started the azithromycin yesterday as well as doing breathing treatments.  I believe that I over did it getting ready for my family's Christmas - a combination of dust from cleaning and leaf dust from outside.  At times, I feel as thou I am having an anxiety attack as my heart will race and on a couple occasions my oxygen level has dropped to 88.  When this happens, I have used my portable oxygen until it stabilizes. I don't have anyone to bring me to [facility] at this time; but if you want to do a zoom call, please let me know.  Thank you! Elaine [name] dob 2045/07/29

## 2020-03-28 NOTE — Telephone Encounter (Signed)
Please see patients response   That will be fine.  I really don't think I need the steroids and have started the azithromyzn (sp). I have done three breathing treatments (Sunday night, Monday night and this morning) which seems to be helping and will do another one tonight. Elaine [name] dob 2045-06-10

## 2020-03-29 ENCOUNTER — Ambulatory Visit (HOSPITAL_COMMUNITY): Payer: Medicare Other

## 2020-05-05 ENCOUNTER — Other Ambulatory Visit: Payer: Self-pay

## 2020-05-05 ENCOUNTER — Ambulatory Visit (HOSPITAL_COMMUNITY)
Admission: RE | Admit: 2020-05-05 | Discharge: 2020-05-05 | Disposition: A | Discharge status: Home / Self Care | Payer: Medicare Other | Source: Ambulatory Visit | Attending: Pulmonary Disease | Admitting: Pulmonary Disease

## 2020-05-05 DIAGNOSIS — Z87891 Personal history of nicotine dependence: Secondary | ICD-10-CM | POA: Diagnosis present

## 2020-05-05 DIAGNOSIS — J449 Chronic obstructive pulmonary disease, unspecified: Secondary | ICD-10-CM

## 2020-05-07 ENCOUNTER — Other Ambulatory Visit: Payer: Self-pay | Admitting: Pulmonary Disease

## 2020-05-11 NOTE — Telephone Encounter (Signed)
Dr. Loanne Drilling, Please see patient comment and advise.  Thank you.

## 2020-06-06 ENCOUNTER — Ambulatory Visit (INDEPENDENT_AMBULATORY_CARE_PROVIDER_SITE_OTHER): Payer: Medicare Other | Admitting: Pulmonary Disease

## 2020-06-06 ENCOUNTER — Encounter: Payer: Self-pay | Admitting: Pulmonary Disease

## 2020-06-06 ENCOUNTER — Other Ambulatory Visit: Payer: Self-pay

## 2020-06-06 VITALS — BP 122/80 | HR 73 | Temp 98.0°F | Ht 63.0 in | Wt 139.0 lb

## 2020-06-06 DIAGNOSIS — Z5181 Encounter for therapeutic drug level monitoring: Secondary | ICD-10-CM | POA: Diagnosis not present

## 2020-06-06 DIAGNOSIS — J432 Centrilobular emphysema: Secondary | ICD-10-CM

## 2020-06-06 NOTE — Patient Instructions (Signed)
  Emphysema/COPD - well-controlled --CONTINUE Anoro Ellipta 62.5-25 mcg 1 puff daily --CONTINUE nebulizer as needed for shortness of breath or wheezing --Azithromycin available in fridge   Follow-up in 6 months

## 2020-06-06 NOTE — Progress Notes (Signed)
Synopsis: Referred in 01/2018 for COPD  Subjective:   PATIENT ID: Elaine [name] GENDER: female DOB: 01/04/1946, MRN: [id number]   HPI  Chief Complaint  Patient presents with  . Follow-up    Productive cough with white mucus.    Ms. Kazuko Clemence is a 75 year-old female with hx right breast cancer s/p mastectomy who presents for COPD follow-up.  Since our last visit, she denies any exacerbations requiring antibiotics or steroids. She has been compliant with Anoro. She feels her COPD well-controlled. Has not used her zpack. Denies cough, shortness of breath or wheezing. She walks 2 miles daily on treadmill. Rarely uses her rescue inhaler. Has been busy with tax season lately. Currently smoking up to 5 cigarettes a day. She self-discontinued chronic macrolide therapy but has not noticed any worsening of symptoms.  Social: 50 pack year history. Quit smoking 01/2018. Currently smoking.  I have personally reviewed patient's past medical/family/social history/allergies/current medications.  Past Medical History:  Diagnosis Date  . Breast cancer ([place]) 1995   left   . Cataract   . Complication of anesthesia   . Emphysema of lung ([place])   . Family history of breast cancer   . Hiatal hernia 1992  . Hypertension   . Hyperthyroidism   . Osteoporosis   . PONV (postoperative nausea and vomiting)       Social History   Occupational History  . Not on file  Tobacco Use  . Smoking status: Current Every Day Smoker    Packs/day: 1.00    Years: 50.00    Pack years: 50.00    Types: Cigarettes    Last attempt to quit: 12/18/2017    Years since quitting: 2.4  . Smokeless tobacco: Never Used  . Tobacco comment: 2-5 cigarettes per day-- 06/06/2020  Vaping Use  . Vaping Use: Never used  Substance and Sexual Activity  . Alcohol use: Yes    Comment: Martini daily  . Drug use: Never  . Sexual activity: Not on file    Allergies  Allergen Reactions  . Bee Venom Other (See Comments)     Unknown  . Codeine Nausea Only  . Macrobid [Nitrofurantoin Macrocrystal] Other (See Comments)    Unknown  . Morphine And Related Other (See Comments)    Unknown  . Oxycodone Nausea Only  . Penicillins Other (See Comments)    Unknown Has patient had a PCN reaction causing immediate rash, facial/tongue/throat swelling, SOB or lightheadedness with hypotension: Unknown Has patient had a PCN reaction causing severe rash involving mucus membranes or skin necrosis: Unknown Has patient had a PCN reaction that required hospitalization: Unknown Has patient had a PCN reaction occurring within the last 10 years: No If all of the above answers are "NO", then may proceed with Cephalosporin use.   . Tyloxapol Nausea Only and Other (See Comments)    Extreme Drugged feeling, Doesn't affect pain     Outpatient Medications Prior to Visit  Medication Sig Dispense Refill  . anastrozole (ARIMIDEX) 1 MG tablet Take 1 tablet (1 mg total) by mouth daily. 30 tablet 3  . azithromycin (ZITHROMAX) 250 MG tablet TAKE 1 TABLET (250 MG TOTAL) BY MOUTH EVERY MONDAY, WEDNESDAY, AND FRIDAY. 39 tablet 0  . denosumab (PROLIA) 60 MG/ML SOSY injection Inject 60 mg into the skin every 6 (six) months.     . fluticasone (FLOVENT HFA) 110 MCG/ACT inhaler TAKE 2 PUFFS BY MOUTH TWICE A DAY 3 each 3  . ipratropium-albuterol (DUONEB) 0.5-2.5 (3) MG/3ML  SOLN Inhale 3 mLs into the lungs See admin instructions. Use 1 vial via nebulizer daily. May use additional vial if needed at bedtime for shortness of breath. 360 mL 3  . losartan (COZAAR) 50 MG tablet Take 50 mg by mouth daily.    . rosuvastatin (CRESTOR) 5 MG tablet Take 5 mg by mouth daily.    Marland Kitchen SYNTHROID 75 MCG tablet Take 75 mcg by mouth daily before breakfast.   1  . umeclidinium-vilanterol (ANORO ELLIPTA) 62.5-25 MCG/INH AEPB Inhale 1 puff into the lungs daily. 3 each 4  . VENTOLIN HFA 108 (90 Base) MCG/ACT inhaler INHALE 2 PUFFS INTO THE LUNGS EVERY 4 (FOUR) HOURS AS NEEDED  FOR WHEEZING OR SHORTNESS OF BREATH. 18 each 5  . Mometasone Furoate (ASMANEX HFA) 100 MCG/ACT AERO Inhale 2 puffs into the lungs as needed. (Patient not taking: Reported on 06/06/2020) 13 g 0  . OXYGEN Inhale 2 L into the lungs continuous. (Patient not taking: No sig reported)     No facility-administered medications prior to visit.    Review of Systems  Constitutional: Negative for chills, diaphoresis, fever, malaise/fatigue and weight loss.  HENT: Negative for congestion.   Respiratory: Negative for cough, hemoptysis, sputum production, shortness of breath and wheezing.   Cardiovascular: Negative for chest pain, palpitations and leg swelling.   Objective:    Vitals:   06/06/20 1157  BP: 122/80  Pulse: 73  Temp: 98 F (36.7 C)  SpO2: 97%  Weight: 139 lb (63 kg)  Height: 5\' 3"  (1.6 m)   SpO2: 97 % O2 Device: None (Room air)   Physical Exam: General: Well-appearing, no acute distress HENT: [place], AT Eyes: EOMI, no scleral icterus Respiratory: Clear to auscultation bilaterally.  No crackles, wheezing or rales Cardiovascular: RRR, -M/R/G, no JVD Extremities:-Edema,-tenderness Neuro: AAO x4, CNII-XII grossly intact Skin: Intact, no rashes or bruising Psych: Normal mood, normal affect  Chest imaging: CT Chest 07/24/17 - Severe emphysema. RML groundglass nodule 18mm CT Chest 03/09/18 - Severe emphysema. Interval resolution of ground glass nodule CT Chest 05/08/20 - Emphysema present. 4.7 mm noncalcified nodule.  PFT:  09/27/18 FVC 3.13 (120%) FEV1 1.84 (99%) Ratio 71  TLC 92% DLCO 42% Interpretation: Normal spirometry  Absolute Eosinophils 11/06/2017: 300  Imaging, labs and test noted above have been reviewed independently by me.  Assessment & Plan:  75 year old female with emphysema who presents for follow-up. Previously on chronic macrolide. Self-discontinued without worsening of symptoms.  Emphysema/COPD - well-controlled. No exacerbations >6 months --CONTINUE Anoro  Ellipta 62.5-25 mcg 1 puff daily. 90 day prescription preferred --CONTINUE nebulizer as needed for shortness of breath or wheezing --Azithromycin available in fridge. QTc reviewed and appropriate  Tobacco Abuse Former smoker <15 years, 30 pack-years, age-appropriate  Low dose lung nodule CT follow-up - Reviewed imaging. No concerning lesions. Next due 05/2021  Orders Placed This Encounter  Procedures  . EKG 12-Lead   No orders of the defined types were placed in this encounter.  Return in about 6 months (around 12/07/2020).   Rodman Pickle, MD [facility] 06/06/2020 12:22 PM

## 2020-06-07 ENCOUNTER — Other Ambulatory Visit: Payer: Self-pay | Admitting: Pulmonary Disease

## 2020-08-03 MED ORDER — PREDNISONE 10 MG PO TABS: 40.0000 mg | ORAL_TABLET | Freq: Every day | ORAL | 0 refills | Status: DC

## 2020-08-03 NOTE — Telephone Encounter (Signed)
Received the following message from patient:   "Good morning!  Thanks to all the pollen, I have started with a flare up.  It started Sunday (5/1) with some shortness of breath and temporary oxygen level dropping.  I used my portable oxygen for a short time and all was good. Tuesday afternoon, it got worse; therefore did a breathing treatment and oxygen.  Wednesday, I started with the azithromycin (Wed. Fri. Mon). Today another breathing treatment and took a prednisone table (25 mg) as my throat was closing up and oxygen level was staying at 88/90 while using the oxygen. While doing the breathing treatment, oxygen levels runs 96/97 but drops once completed. Currently my oxygen level is 92, pr 68 - without using oxygen. Do you recommend anything different? (I only have the [employer] portable oxygen concentrator). Note: My thyroid is acting up; therefore Dr. Vista Deck has me taking a 50 mg and 75 mg of synthroid on rotating days. Elaine [name]  dob [birth year]"   Dr. Valeta Harms, can you please advise Dr. Loanne Drilling is not available? Thanks!

## 2020-08-04 ENCOUNTER — Telehealth: Payer: Self-pay | Admitting: Pulmonary Disease

## 2020-08-04 NOTE — Telephone Encounter (Signed)
Spoke with the pt  She states used to have o2 from DME 2018-2019 and then she was able to come off of it  She then purchased Inogen POC to have for emergencies She would like to get a new o2 prescription sent to DME  I advised needs ov for this  Appt scheduled  Nothing further needed

## 2020-08-07 ENCOUNTER — Ambulatory Visit: Payer: Medicare Other | Admitting: Adult Health

## 2020-09-13 ENCOUNTER — Telehealth: Payer: Self-pay | Admitting: Pulmonary Disease

## 2020-09-13 NOTE — Telephone Encounter (Signed)
Called patient but she did not answer. Left message for her to call back.  

## 2020-09-13 NOTE — Telephone Encounter (Signed)
Pt stated that she has been having issues with her COPD flare ups and stated she has been to the urgent care and she said she can barely walk across the room without her HR increasing or feeling very SOB and loses control of her bladder. Pls regard; [phone].

## 2020-09-14 NOTE — Telephone Encounter (Signed)
Called and and was advised by female that answered the phone she was in the restroom and will call us back  Forwarding back to triage as urgent for f/u

## 2020-09-14 NOTE — Telephone Encounter (Signed)
Called and spoke with patient who states that she has been having issues with her COPD flare ups and stated she has been to the urgent care and she said she can barely walk across the room without her HR increasing or feeling very SOB and loses control of her bladder.  When I spoke with patient she said that she was currently in the ER in [place] and was waiting for a room to be admitted. Advised patient that I would send message to Dr. Loanne Drilling as an [place] to let her know. Nothing further needed at this time.

## 2020-09-18 ENCOUNTER — Telehealth: Payer: Self-pay | Admitting: Pulmonary Disease

## 2020-09-18 MED ORDER — AZITHROMYCIN 250 MG PO TABS: 250.0000 mg | ORAL_TABLET | ORAL | 0 refills | Status: DC

## 2020-09-18 MED ORDER — PREDNISONE 10 MG PO TABS: ORAL_TABLET | ORAL | 0 refills | Status: AC

## 2020-09-18 MED ORDER — FLUTICASONE PROPIONATE HFA 110 MCG/ACT IN AERO: INHALATION_SPRAY | RESPIRATORY_TRACT | 3 refills | Status: DC

## 2020-09-18 NOTE — Telephone Encounter (Signed)
Called and spoke with Patient.  Patient stated she is needing Dr. Cordelia Pen recommendations to continue azithromycin and Prednisone.  Patient stated she was released from [facility] 09/16/20 and was given IV azithromycin and Prednisone.  Patient stated she was d/c with Prednisone 40mg  x's 2 days and 20mg  and stop.  Patient wanted to discuss azithromycin M,W,F, and Prednisone daily.  Patient requested a telephone visit with Dr. Loanne Drilling to discuss this week, but Dr. Loanne Drilling is working hospital at night.  Patient was offered NP visit, but Patient request to discuss with Dr. Loanne Drilling.  Patient did schedule July 18,2022 follow up in office, with Dr. Loanne Drilling. Advised Patient I was unsure of when Dr. Loanne Drilling would respond to message. Patient stated that is ok, and if needed Dr. Loanne Drilling can call Patient to discuss.  Patient stated she can not use my chart ar this time, because her computer and passwords are in her basement.  Patient stated she is having sob, and can not use stairs.   Message routed to Dr. Loanne Drilling per Patient request to advise when available

## 2020-09-18 NOTE — Telephone Encounter (Signed)
I called patient regarding her recent hospitalization for COPD exacerbation. She has required steroids x 3 since May related to her COPD and is currently requiring 2L O2. She recently decided to quit smoking.   Assessment/Plan  Recent COPD Exacerbation --CONTINUE Anoro One puff ONCE a day --START Flovent 110 mcg TWO puffs TWICE a day. 90 Prescription ordered --CONTINUE Azithromycin every Monday-Wed-Friday. 90 prescription ordered. --COMPLETE your current prednisone. Will give another prednisone taper if COPD flares up again. We discussed signs/symptoms of a flare-up.  Tobacco Abuse Congratulated on quitting Advised against smoking while on supplemental oxygen  Will keep current follow-up. Sooner if needed

## 2020-09-20 NOTE — Telephone Encounter (Signed)
Patient is requesting recommendations for current neb treatments.  Patient stated she was doing them 2-3 times per day at the hospital.    Message routed to Dr. Loanne Drilling to advise

## 2020-10-16 ENCOUNTER — Ambulatory Visit (INDEPENDENT_AMBULATORY_CARE_PROVIDER_SITE_OTHER): Payer: Medicare Other | Admitting: Pulmonary Disease

## 2020-10-16 ENCOUNTER — Other Ambulatory Visit: Payer: Self-pay

## 2020-10-16 ENCOUNTER — Encounter: Payer: Self-pay | Admitting: Pulmonary Disease

## 2020-10-16 VITALS — BP 122/68 | HR 74 | Ht 63.0 in | Wt 140.0 lb

## 2020-10-16 DIAGNOSIS — F17210 Nicotine dependence, cigarettes, uncomplicated: Secondary | ICD-10-CM | POA: Diagnosis not present

## 2020-10-16 DIAGNOSIS — J438 Other emphysema: Secondary | ICD-10-CM | POA: Diagnosis not present

## 2020-10-16 NOTE — Progress Notes (Signed)
Synopsis: Referred in 01/2018 for COPD  Subjective:   PATIENT ID: Elaine [name] GENDER: female DOB: [birth year], MRN: [id number]   HPI  Chief Complaint  Patient presents with   COPD    Follow up, using 2L oxygen at night   Elaine [name] is a 75 year-old female with hx right breast cancer s/p mastectomy who presents for COPD follow-up.  Since our last visit, she was admitted 6/16 at [facility] for two nights for COPD exacerbation. Records reviewed. She was treated with prednisone and nebulizers. Her daytime oxygen as improved after hospitalization. She continues to wear oxygen when levels are <88% and at night. Still smoking 5 cigarette daily. Denies shortness of breath, cough and wheezing.  Social: 50 pack year history. Quit smoking 01/2018. Currently smoking  I have personally reviewed patient's past medical/family/social history/allergies/current medications.  Past Medical History:  Diagnosis Date   Breast cancer ([place]) 1995   left    Cataract    Complication of anesthesia    Emphysema of lung ([place])    Family history of breast cancer    Hiatal hernia 1992   Hypertension    Hyperthyroidism    Osteoporosis    PONV (postoperative nausea and vomiting)       Social History   Occupational History   Not on file  Tobacco Use   Smoking status: Every Day    Packs/day: 1.00    Years: 50.00    Pack years: 50.00    Types: Cigarettes    Last attempt to quit: 12/18/2017    Years since quitting: 2.8   Smokeless tobacco: Never   Tobacco comments:    2-5 cigarettes per day-- 06/06/2020  Vaping Use   Vaping Use: Never used  Substance and Sexual Activity   Alcohol use: Yes    Comment: Martini daily   Drug use: Never   Sexual activity: Not on file    Allergies  Allergen Reactions   Bee Venom Other (See Comments)    Unknown   Codeine Nausea Only   Macrobid [Nitrofurantoin Macrocrystal] Other (See Comments)    Unknown   Morphine And Related Other (See Comments)     Unknown   Oxycodone Nausea Only   Penicillins Other (See Comments)    Unknown Has patient had a PCN reaction causing immediate rash, facial/tongue/throat swelling, SOB or lightheadedness with hypotension: Unknown Has patient had a PCN reaction causing severe rash involving mucus membranes or skin necrosis: Unknown Has patient had a PCN reaction that required hospitalization: Unknown Has patient had a PCN reaction occurring within the last 10 years: No If all of the above answers are "NO", then may proceed with Cephalosporin use.    Tyloxapol Nausea Only and Other (See Comments)    Extreme Drugged feeling, Doesn't affect pain     Outpatient Medications Prior to Visit  Medication Sig Dispense Refill   ANORO ELLIPTA 62.5-25 MCG/INH AEPB INHALE 1 PUFF BY MOUTH DAILY 180 each 4   azithromycin (ZITHROMAX) 250 MG tablet Take 1 tablet (250 mg total) by mouth every Monday, Wednesday, and Friday. 39 tablet 0   denosumab (PROLIA) 60 MG/ML SOSY injection Inject 60 mg into the skin every 6 (six) months.      fluticasone (FLOVENT HFA) 110 MCG/ACT inhaler TAKE 2 PUFFS BY MOUTH TWICE A DAY 3 each 3   ipratropium-albuterol (DUONEB) 0.5-2.5 (3) MG/3ML SOLN Inhale 3 mLs into the lungs See admin instructions. Use 1 vial via nebulizer daily. May use additional vial  if needed at bedtime for shortness of breath. 360 mL 3   losartan (COZAAR) 50 MG tablet Take 50 mg by mouth daily.     OXYGEN Inhale 2 L into the lungs continuous.     predniSONE (DELTASONE) 10 MG tablet Take 4 tablets (40 mg total) by mouth daily with breakfast. 20 tablet 0   rosuvastatin (CRESTOR) 5 MG tablet Take 5 mg by mouth daily.     SYNTHROID 75 MCG tablet Take 75 mcg by mouth daily before breakfast.   1   VENTOLIN HFA 108 (90 Base) MCG/ACT inhaler INHALE 2 PUFFS INTO THE LUNGS EVERY 4 (FOUR) HOURS AS NEEDED FOR WHEEZING OR SHORTNESS OF BREATH. 18 each 5   anastrozole (ARIMIDEX) 1 MG tablet Take 1 tablet (1 mg total) by mouth daily. (Patient  not taking: Reported on 10/16/2020) 30 tablet 3   No facility-administered medications prior to visit.    Review of Systems  Constitutional:  Negative for chills, diaphoresis, fever, malaise/fatigue and weight loss.  HENT:  Negative for congestion.   Respiratory:  Negative for cough, hemoptysis, sputum production, shortness of breath and wheezing.   Cardiovascular:  Negative for chest pain, palpitations and leg swelling.  Objective:    Vitals:   10/16/20 1614  BP: 122/68  Pulse: 74  SpO2: 97%  Weight: 140 lb (63.5 kg)  Height: 5\' 3"  (1.6 m)   SpO2: 97 %   Physical Exam: General: Well-appearing, no acute distress HENT: [place], AT Eyes: EOMI, no scleral icterus Respiratory: Clear to auscultation bilaterally.  No crackles, wheezing or rales Cardiovascular: RRR, -M/R/G, no JVD Extremities:-Edema,-tenderness Neuro: AAO x4, CNII-XII grossly intact Psych: Normal mood, normal affect  Data Reviewed  Chest imaging: CT Chest 07/24/17 - Severe emphysema. RML groundglass nodule 85mm CT Chest 03/09/18 - Severe emphysema. Interval resolution of ground glass nodule CT Chest 05/05/20 - Emphysema present. 4.7 mm noncalcified nodule.  PFT:  09/27/18 FVC 3.13 (120%) FEV1 1.84 (99%) Ratio 71  TLC 92% DLCO 42% Interpretation: Normal spirometry  Labs: Absolute Eosinophils 11/06/2017: 300  Assessment & Plan:   75 year old female with emphysema. Exacerbation requiring hospitalization in 08/2020, one month ago. We reviewed clinical course of COPD, bronchodilator management and benefit of addition of macrolide to reduce hospitalization.  Emphysema/COPD  GOLD Class C (last exacerbation requiring hospitalization 08/2020) --CONTINUE Anoro Ellipta 62.5-25 mcg 1 puff daily. 90 day prescription preferred --CONTINUE nebulizer as needed for shortness of breath or wheezing --Azithromycin available in fridge. QTc reviewed and appropriate  Tobacco Abuse Former smoker <15 years, 30 pack-years, age-appropriate   Low dose lung nodule CT follow-up - Reviewed imaging. No concerning lesions. Next due 05/2021  Tobacco abuse Patient is an active smoker. We discussed smoking cessation for 5 minutes. We discussed triggers and stressors and ways to deal with them. We discussed barriers to continued smoking and benefits of smoking cessation. Provided patient with information cessation techniques and interventions including [place] quitline.  No orders of the defined types were placed in this encounter.  No orders of the defined types were placed in this encounter.  Return in about 3 months (around 01/16/2021).  I have spent a total time of 31-minutes on the day of the appointment reviewing prior documentation, coordinating care and discussing medical diagnosis and plan with the patient/family. Past medical history, allergies, medications were reviewed. Pertinent imaging, labs and tests included in this note have been reviewed and interpreted independently by me.   [place], MD [facility] 10/16/2020  4:33 PM

## 2020-10-16 NOTE — Patient Instructions (Addendum)
COPD --CONTINUE Anoro One puff ONCE a day --CONTINUE Flovent 110 mcg TWO puffs TWICE a day. 90 Prescription ordered --CONTINUE Azithromycin every Monday-Wed-Friday. 90 prescription ordered. --Previously provided prednisone for use as needed. Please contact us if you start on this medication as an FYI to me  Follow-up with me in 3 months

## 2020-10-21 ENCOUNTER — Other Ambulatory Visit: Payer: Self-pay | Admitting: Pulmonary Disease

## 2020-11-15 ENCOUNTER — Encounter: Payer: Self-pay | Admitting: Pulmonary Disease

## 2021-01-06 ENCOUNTER — Other Ambulatory Visit: Payer: Self-pay

## 2021-01-06 ENCOUNTER — Emergency Department (HOSPITAL_COMMUNITY): Payer: Medicare Other

## 2021-01-06 ENCOUNTER — Emergency Department (HOSPITAL_COMMUNITY)
Admission: EM | Admit: 2021-01-06 | Discharge: 2021-01-06 | Disposition: A | Discharge status: Home / Self Care | Payer: Medicare Other | Attending: Emergency Medicine | Admitting: Emergency Medicine

## 2021-01-06 DIAGNOSIS — I10 Essential (primary) hypertension: Secondary | ICD-10-CM | POA: Diagnosis not present

## 2021-01-06 DIAGNOSIS — Z853 Personal history of malignant neoplasm of breast: Secondary | ICD-10-CM | POA: Insufficient documentation

## 2021-01-06 DIAGNOSIS — F17210 Nicotine dependence, cigarettes, uncomplicated: Secondary | ICD-10-CM | POA: Diagnosis not present

## 2021-01-06 DIAGNOSIS — M25552 Pain in left hip: Secondary | ICD-10-CM | POA: Insufficient documentation

## 2021-01-06 DIAGNOSIS — Z79899 Other long term (current) drug therapy: Secondary | ICD-10-CM | POA: Diagnosis not present

## 2021-01-06 NOTE — ED Triage Notes (Signed)
Pt to the ED with complaints of Left hip pain that began when she fell on 9/29.  This morning she began to get a catch in her hip.

## 2021-01-06 NOTE — ED Provider Notes (Signed)
[facility] EMERGENCY DEPARTMENT Provider Note   CSN: [id number] Arrival date & time: 01/06/21  1514     History Chief Complaint  Patient presents with   Hip Pain    Elaine [name] is a 75 y.o. female.  Patient states she is has left hip pain.  Is worse with movement.  She fell few weeks ago.  The history is provided by the patient and medical records. No language interpreter was used.  Hip Pain This is a new problem. The current episode started more than 1 week ago. The problem occurs rarely. The problem has not changed since onset.Pertinent negatives include no chest pain, no abdominal pain and no headaches. Nothing aggravates the symptoms. Nothing relieves the symptoms. She has tried nothing for the symptoms. The treatment provided no relief.      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     Family History  Problem Relation Age of Onset   Aneurysm Mother    Heart attack Father    Diabetes Sister    Diabetes Sister    Breast cancer Cousin 50   Breast cancer Cousin        20's/30's   Breast cancer Cousin        3 of Mcousins1xremoved (mom's cousins) had breast cancer 70+    Social History   Tobacco Use   Smoking status: Every Day    Packs/day: 1.00    Years: 50.00    Pack years: 50.00    Types: Cigarettes    Last attempt to quit: 12/18/2017    Years since quitting: 3.0   Smokeless tobacco: Never   Tobacco comments:    2-5 cigarettes per day-- 06/06/2020  Vaping Use   Vaping Use: Never used  Substance Use Topics   Alcohol use: Yes    Comment: Martini daily   Drug use: Never    Home Medications Prior to Admission medications   Medication Sig Start Date End Date Taking? Authorizing Provider  anastrozole (ARIMIDEX) 1 MG tablet Take 1 tablet (1 mg total) by mouth daily. Patient not taking: Reported on 10/16/2020 01/20/18   Gardenia Phlegm, NP  Jearl Klinefelter ELLIPTA 62.5-25 MCG/INH AEPB INHALE 1 PUFF BY MOUTH DAILY 06/08/20   [name], MD  azithromycin (ZITHROMAX) 250 MG tablet TAKE 1 TABLET (250 MG TOTAL) BY MOUTH EVERY MONDAY, WEDNESDAY, AND FRIDAY. 10/23/20   [name], MD  denosumab (PROLIA) 60 MG/ML SOSY injection Inject 60 mg into the skin every 6 (six) months.     [provider]  fluticasone (FLOVENT HFA) 110 MCG/ACT inhaler TAKE 2 PUFFS BY MOUTH TWICE A DAY 09/18/20   [name], MD  ipratropium-albuterol (DUONEB) 0.5-2.5 (3) MG/3ML SOLN Inhale 3 mLs into the lungs See admin instructions. Use 1 vial via nebulizer daily. May use additional vial if needed at bedtime for shortness of breath. 11/30/19   [name], MD  losartan (COZAAR) 50 MG tablet Take 50 mg by mouth daily.    [provider]  OXYGEN Inhale 2 L into the lungs continuous.    [provider]  predniSONE (DELTASONE) 10  MG tablet Take 4 tablets (40 mg total) by mouth daily with breakfast. 08/03/20   Icard, Leory Plowman L, DO  rosuvastatin (CRESTOR) 5 MG tablet Take 5 mg by mouth daily. 02/01/19   [provider]  SYNTHROID 75 MCG tablet Take 75 mcg by mouth daily before breakfast.  10/25/17   [provider]  VENTOLIN HFA 108 (90 Base) MCG/ACT inhaler INHALE 2 PUFFS INTO THE LUNGS EVERY 4 (FOUR) HOURS AS NEEDED FOR WHEEZING OR SHORTNESS OF BREATH. 05/08/20   [name], MD    Allergies    Bee venom, Codeine, Macrobid [nitrofurantoin macrocrystal], Morphine and related, Oxycodone, Penicillins, and Tyloxapol  Review of Systems   Review of Systems  Constitutional:  Negative for appetite change and fatigue.  HENT:  Negative for congestion, ear discharge and sinus pressure.   Eyes:  Negative for discharge.  Respiratory:  Negative for cough.   Cardiovascular:  Negative for chest pain.  Gastrointestinal:  Negative for abdominal pain and diarrhea.  Genitourinary:  Negative for frequency and hematuria.  Musculoskeletal:  Negative for back pain.       Left hip pain  Skin:  Negative for rash.  Neurological:  Negative for seizures and headaches.  Psychiatric/Behavioral:  Negative for hallucinations.    Physical Exam Updated Vital Signs BP (!) 179/95 (BP Location: Right Arm)   Pulse 71   Temp 97.8 F (36.6 C)   Resp 16   Ht 5\' 3"  (1.6 m)   Wt 63.5 kg   SpO2 94%   BMI 24.80 kg/m   Physical Exam Vitals and nursing note reviewed.  Constitutional:      Appearance: She is well-developed.  HENT:     Head: Normocephalic.     Nose: Nose normal.  Eyes:     General: No scleral icterus.    Conjunctiva/sclera: Conjunctivae normal.  Neck:     Thyroid: No thyromegaly.  Cardiovascular:     Rate and Rhythm: Normal rate and regular rhythm.     Heart sounds: No murmur heard.   No friction rub. No gallop.  Pulmonary:     Breath sounds: No stridor. No wheezing or rales.  Chest:     Chest wall:  No tenderness.  Abdominal:     General: There is no distension.     Tenderness: There is no abdominal tenderness. There is no rebound.  Musculoskeletal:        General: Normal range of motion.     Cervical back: Neck supple.     Comments: Minimal tenderness left hip  Lymphadenopathy:     Cervical: No cervical adenopathy.  Skin:  Findings: No erythema or rash.  Neurological:     Mental Status: She is alert and oriented to person, place, and time.     Motor: No abnormal muscle tone.     Coordination: Coordination normal.  Psychiatric:        Behavior: Behavior normal.    ED Results / Procedures / Treatments   Labs (all labs ordered are listed, but only abnormal results are displayed) Labs Reviewed - No data to display  EKG None  Radiology DG Hip Unilat W or Wo Pelvis 2-3 Views Left  Result Date: 01/06/2021 CLINICAL DATA:  [place] on 11/27/2020 onto LEFT hip, worsened posterior LEFT hip pain EXAM: DG HIP (WITH OR WITHOUT PELVIS) 2-3V LEFT COMPARISON:  None FINDINGS: Osseous demineralization. Hip and SI joint spaces preserved. No acute fracture, dislocation, or bone destruction. IMPRESSION: No acute osseous abnormalities. Electronically Signed   By: Lavonia Dana M.D.   On: 01/06/2021 16:23    Procedures Procedures   Medications Ordered in ED Medications - No data to display  ED Course  I have reviewed the triage vital signs and the nursing notes.  Pertinent labs & imaging results that were available during my care of the patient were reviewed by me and considered in my medical decision making (see chart for details).    MDM Rules/Calculators/A&P                           Patient with left hip pain.  Patient will be treated with Motrin for discomfort.  Presently we do not have a CT scanner that works or an MRI scan here.  Patient will be following up with orthopedics Final Clinical Impression(s) / ED Diagnoses Final diagnoses:  None    Rx / DC Orders ED Discharge  Orders     None        Milton Ferguson, MD 01/06/21 2019

## 2021-01-06 NOTE — Discharge Instructions (Addendum)
Follow-up with Dr. Aline Brochure in [place] this week or follow-up with Dr. Stann Mainland and [place].  Take your Motrin 800 mg 3 times a day for pain

## 2021-01-08 ENCOUNTER — Telehealth: Payer: Self-pay | Admitting: Orthopedic Surgery

## 2021-01-08 NOTE — Telephone Encounter (Signed)
Patient left message / call returned; states seen at [facility] emergency room visit 01/06/21 for hip pain, left - said had fall a while back; had xrays.  Call returned-patient to call us back tomorrow after 2:00 to schedule appointment - requests Dr Aline Brochure if possible.

## 2021-01-10 NOTE — Telephone Encounter (Signed)
Called patient; aware of appointment.

## 2021-01-18 ENCOUNTER — Ambulatory Visit: Payer: Medicare Other | Admitting: Orthopedic Surgery

## 2021-01-24 ENCOUNTER — Encounter: Payer: Self-pay | Admitting: Pulmonary Disease

## 2021-01-24 ENCOUNTER — Ambulatory Visit (INDEPENDENT_AMBULATORY_CARE_PROVIDER_SITE_OTHER): Payer: Medicare Other | Admitting: Pulmonary Disease

## 2021-01-24 ENCOUNTER — Other Ambulatory Visit: Payer: Self-pay

## 2021-01-24 VITALS — BP 112/70 | HR 76 | Temp 97.8°F | Ht 63.0 in | Wt 142.8 lb

## 2021-01-24 DIAGNOSIS — J449 Chronic obstructive pulmonary disease, unspecified: Secondary | ICD-10-CM

## 2021-01-24 DIAGNOSIS — F17210 Nicotine dependence, cigarettes, uncomplicated: Secondary | ICD-10-CM | POA: Diagnosis not present

## 2021-01-24 MED ORDER — PREDNISONE 10 MG PO TABS: 40.0000 mg | ORAL_TABLET | Freq: Every day | ORAL | 0 refills | Status: AC

## 2021-01-24 MED ORDER — AZITHROMYCIN 250 MG PO TABS: 250.0000 mg | ORAL_TABLET | ORAL | 0 refills | Status: DC

## 2021-01-24 MED ORDER — ALBUTEROL SULFATE HFA 108 (90 BASE) MCG/ACT IN AERS: 2.0000 | INHALATION_SPRAY | Freq: Four times a day (QID) | RESPIRATORY_TRACT | 3 refills | Status: DC | PRN

## 2021-01-24 NOTE — Patient Instructions (Addendum)
Emphysema/COPD  GOLD Class C  --CONTINUE Flovent 110 mcg TWO puff TWICE a day --CONTINUE Anoro Ellipta 62.5-25 mcg ONE puff ONCE a day (90 day prescription preferred) --REFILL Azithromycin. QTc reviewed and appropriate in 2023 --ORDER nebulizer supplies [employer]) --CONTINUE nebulizer as needed for shortness of breath or wheezing

## 2021-01-24 NOTE — Progress Notes (Signed)
Synopsis: Referred in 01/2018 for COPD  Subjective:   PATIENT ID: Elaine [name] DOB: [birth year], MRN: [id number]   HPI  Chief Complaint  Patient presents with   Follow-up    Sob mainly during exertion   [name] is a 75 year-old [name] with hx right breast cancer s/p mastectomy who presents for COPD follow-up  2019 - Established care with [place] in November. On Anoro and chronic macrolide 2020 - Outpatient exacerbation Dec 2021 - Urgent care for COPD exacerbation in July 2022 - Hospitalized for COPD exacerbation in June  10/16/20 Since our last visit, she was admitted 6/16 at [facility] for two nights for COPD exacerbation. Records reviewed. She was treated with prednisone and nebulizers. Her daytime oxygen as improved after hospitalization. She continues to wear oxygen when levels are <88% and at night. Still smoking 5 cigarette daily. Denies shortness of breath, cough and wheezing.  01/24/21 Since our last visit, she fell/passed out at the end of August. She states she was wearing her oxygen. Since this fall she has some coughing during the day and night. Has been using her nebulizer up to twice a day since the last month when she was blowing her leaves. Still smoking. She is compliant with her Anoro, azithromycin and oxygen.  Social: 50 pack year history. Quit smoking 01/2018. Currently smoking  Past Medical History:  Diagnosis Date   Breast cancer ([place]) 1995   left    Cataract    Complication of anesthesia    Emphysema of lung ([place])    Family history of breast cancer    Hiatal hernia 1992   Hypertension    Hyperthyroidism    Osteoporosis    PONV (postoperative nausea and vomiting)       Social History   Occupational History   Not on file  Tobacco Use   Smoking status: Every Day    Packs/day: 1.00    Years: 50.00    Pack years: 50.00    Types: Cigarettes    Last attempt to quit: 12/18/2017    Years since quitting: 3.1   Smokeless  tobacco: Never   Tobacco comments:    2-5 cigarettes per day-- 06/06/2020  Vaping Use   Vaping Use: Never used  Substance and Sexual Activity   Alcohol use: Yes    Comment: Martini daily   Drug use: Never   Sexual activity: Not on file    Allergies  Allergen Reactions   Bee Venom Other (See Comments)    Unknown   Codeine Nausea Only   Macrobid [Nitrofurantoin Macrocrystal] Other (See Comments)    Unknown   Morphine And Related Other (See Comments)    Unknown   Oxycodone Nausea Only   Penicillins Other (See Comments)    Unknown Has patient had a PCN reaction causing immediate rash, facial/tongue/throat swelling, SOB or lightheadedness with hypotension: Unknown Has patient had a PCN reaction causing severe rash involving mucus membranes or skin necrosis: Unknown Has patient had a PCN reaction that required hospitalization: Unknown Has patient had a PCN reaction occurring within the last 10 years: No If all of the above answers are "NO", then may proceed with Cephalosporin use.    Tyloxapol Nausea Only and Other (See Comments)    Extreme Drugged feeling, Doesn't affect pain     Outpatient Medications Prior to Visit  Medication Sig Dispense Refill   ANORO ELLIPTA 62.5-25 MCG/INH AEPB INHALE 1 PUFF BY MOUTH DAILY 180 each 4  azithromycin (ZITHROMAX) 250 MG tablet TAKE 1 TABLET (250 MG TOTAL) BY MOUTH EVERY MONDAY, WEDNESDAY, AND FRIDAY. 39 tablet 0   denosumab (PROLIA) 60 MG/ML SOSY injection Inject 60 mg into the skin every 6 (six) months.      fluticasone (FLOVENT HFA) 110 MCG/ACT inhaler TAKE 2 PUFFS BY MOUTH TWICE A DAY 3 each 3   ipratropium-albuterol (DUONEB) 0.5-2.5 (3) MG/3ML SOLN Inhale 3 mLs into the lungs See admin instructions. Use 1 vial via nebulizer daily. May use additional vial if needed at bedtime for shortness of breath. 360 mL 3   losartan (COZAAR) 50 MG tablet Take 50 mg by mouth daily.     OXYGEN Inhale 2 L into the lungs continuous.     rosuvastatin  (CRESTOR) 5 MG tablet Take 5 mg by mouth daily.     SYNTHROID 75 MCG tablet Take 75 mcg by mouth daily before breakfast. Rotation between 73mcg one day and 66mcg the next  1   VENTOLIN HFA 108 (90 Base) MCG/ACT inhaler INHALE 2 PUFFS INTO THE LUNGS EVERY 4 (FOUR) HOURS AS NEEDED FOR WHEEZING OR SHORTNESS OF BREATH. 18 each 5   anastrozole (ARIMIDEX) 1 MG tablet Take 1 tablet (1 mg total) by mouth daily. (Patient not taking: No sig reported) 30 tablet 3   predniSONE (DELTASONE) 10 MG tablet Take 4 tablets (40 mg total) by mouth daily with breakfast. (Patient not taking: Reported on 01/24/2021) 20 tablet 0   No facility-administered medications prior to visit.    Review of Systems  Constitutional:  Negative for chills, diaphoresis, fever, malaise/fatigue and weight loss.  HENT:  Negative for congestion.   Respiratory:  Positive for cough and shortness of breath. Negative for hemoptysis, sputum production and wheezing.   Cardiovascular:  Negative for chest pain, palpitations and leg swelling.  Objective:    Vitals:   01/24/21 1331  BP: 112/70  Pulse: 76  Temp: 97.8 F (36.6 C)  SpO2: 95%  Weight: 142 lb 12.8 oz (64.8 kg)  Height: 5\' 3"  (1.6 m)   SpO2: 95 % O2 Device: None (Room air)   Physical Exam: General: Well-appearing, no acute distress HENT: [place], AT Eyes: EOMI, no scleral icterus Respiratory: Clear to auscultation bilaterally.  No crackles, wheezing or rales Cardiovascular: RRR, -M/R/G, no JVD Extremities:-Edema,-tenderness Neuro: AAO x4, CNII-XII grossly intact Psych: Normal mood, normal affect  Data Reviewed  Chest imaging: CT Chest 07/24/17 - Severe emphysema. RML groundglass nodule 71mm CT Chest 03/09/18 - Severe emphysema. Interval resolution of ground glass nodule CT Chest 05/05/20 - Emphysema present. 4.7 mm noncalcified nodule.  PFT:  09/27/18 FVC 3.13 (120%) FEV1 1.84 (99%) Ratio 71  TLC 92% DLCO 42% Interpretation: Normal spirometry  Labs: Absolute  Eosinophils  11/06/2017: 300 04/28/18: 200 Assessment & Plan:  75 year old [name] active smoker with hx of right breast cancer and emphysema who presents for follow-up. Ambulatory O2  with no desaturations. Reviewed action plan for exacerbations (prefers low-dose prednisone), smoking cessation and bronchodilator management as noted below.  Emphysema/COPD  GOLD Class C  --CONTINUE Flovent 110 mcg TWO puff TWICE a day --CONTINUE Anoro Ellipta 62.5-25 mcg ONE puff ONCE a day (90 day prescription preferred) --REFILL Azithromycin. QTc reviewed and appropriate in 2023 --ORDER nebulizer supplies [employer]) --CONTINUE Duoneb as needed for shortness of breath or wheezing  Tobacco abuse Patient is an active smoker. We discussed smoking cessation for 3 minutes. We discussed triggers and stressors and ways to deal with them. We discussed barriers to  continued smoking and benefits of smoking cessation. Provided patient with information cessation techniques and interventions.  Health Maintenance Immunization History  Administered Date(s) Administered   Influenza Inj Mdck Quad Pf 01/23/2018   Influenza-Unspecified 01/13/2015, 02/08/2016, 01/16/2017, 01/28/2018   Janssen (J&J) SARS-COV-2 Vaccination 07/01/2019, 02/12/2020   Pneumococcal Polysaccharide-23 08/10/2016   Pneumococcal-Unspecified 08/02/2014   Tdap 08/02/2014   CT Lung Screen - Enrolled. Due 05/2021  Orders Placed This Encounter  Procedures   Ambulatory Referral for DME    Referral Priority:   Routine    Referral Type:   Durable Medical Equipment Purchase    Number of Visits Requested:   1    Meds ordered this encounter  Medications   albuterol (VENTOLIN HFA) 108 (90 Base) MCG/ACT inhaler    Sig: Inhale 2 puffs into the lungs every 6 (six) hours as needed for wheezing or shortness of breath.    Dispense:  8 g    Refill:  3   predniSONE (DELTASONE) 10 MG tablet    Sig: Take 4 tablets (40 mg total) by mouth daily with  breakfast for 5 days.    Dispense:  20 tablet    Refill:  0   azithromycin (ZITHROMAX) 250 MG tablet    Sig: Take 1 tablet (250 mg total) by mouth every Monday, Wednesday, and Friday.    Dispense:  39 tablet    Refill:  0    Return in about 4 months (around 05/27/2021).  I have spent a total time of 33-minutes on the day of the appointment reviewing prior documentation, coordinating care and discussing medical diagnosis and plan with the patient/family. Past medical history, allergies, medications were reviewed. Pertinent imaging, labs and tests included in this note have been reviewed and interpreted independently by me.  Christophr Calix Rodman Pickle, MD [facility] 01/24/2021 1:41 PM

## 2021-01-25 ENCOUNTER — Encounter: Payer: Self-pay | Admitting: Orthopedic Surgery

## 2021-01-25 ENCOUNTER — Ambulatory Visit (INDEPENDENT_AMBULATORY_CARE_PROVIDER_SITE_OTHER): Payer: Medicare Other | Admitting: Orthopedic Surgery

## 2021-01-25 ENCOUNTER — Ambulatory Visit: Payer: Medicare Other

## 2021-01-25 VITALS — BP 152/77 | HR 95 | Ht 63.0 in | Wt 143.0 lb

## 2021-01-25 DIAGNOSIS — M545 Low back pain, unspecified: Secondary | ICD-10-CM

## 2021-01-25 NOTE — Progress Notes (Signed)
Chief Complaint  Patient presents with   Hip Pain    Lt side hip pain since fall DOI 11/27/20.     HPI 75 year old female with osteopenia on Prolia who is noted to have COPD along with hypothyroidism fell in August had severe pain in her left hip had x-rays of the hip they were negative.  Most of her pain now is in her lower back occasionally runs into the right upper leg.  No lower leg symptoms.  She says she seems to be getting better  Past Medical History:  Diagnosis Date   Breast cancer ([place]) 1995   left    Cataract    Complication of anesthesia    Emphysema of lung ([place])    Family history of breast cancer    Hiatal hernia 1992   Hypertension    Hyperthyroidism    Osteoporosis    PONV (postoperative nausea and vomiting)     BP (!) 152/77   Pulse 95   Ht 5\' 3"  (1.6 m)   Wt 143 lb (64.9 kg)   BMI 25.33 kg/m    General appearance: Well-developed well-nourished no gross deformities  Cardiovascular normal pulse and perfusion normal color without edema  Neurologically no sensation loss or deficits or pathologic reflexes  Psychological: Awake alert and oriented x3 mood and affect normal  Skin no lacerations or ulcerations no nodularity no palpable masses, no erythema or nodularity  Musculoskeletal: Lumbar spine scoliosis tenderness at the belt line  Normal range of motion without pain in both hips    Imaging and image was done at the hospital I interpret this image as no fracture of the hip no arthritis in the hip  Lumbar spine film was also obtained in the office degenerative scoliosis with calcification of the aorta and some degenerative disc changes and facet arthritis no acute fracture  A/P  She is 75 years old she is getting better she does have some osteopenia she is in treatment for that she was to return to exercises which is fine we asked her to avoid the abdominal crunches and sit ups which will put strain on her lower back  Follow-up as needed

## 2021-01-26 ENCOUNTER — Encounter: Payer: Self-pay | Admitting: Pulmonary Disease

## 2021-02-14 DIAGNOSIS — J432 Centrilobular emphysema: Secondary | ICD-10-CM

## 2021-02-14 MED ORDER — PREDNISONE 10 MG PO TABS: 40.0000 mg | ORAL_TABLET | Freq: Every day | ORAL | 0 refills | Status: AC

## 2021-02-14 NOTE — Telephone Encounter (Signed)
She reports worsening wheezing, shortness of breath, cough after being outdoors. Has taken 40 mg x 5 days on 10/27 and 11/12 with some improvement. Requesting back-up prednisone. Also requesting Pulmonary Rehab referral.  Orders placed: Prednisone  Refer to Pulm Referral at [facility]  Please schedule patient for follow-up with me on 05/16/21 at 2 PM

## 2021-02-14 NOTE — Telephone Encounter (Signed)
Received the following message from patient:  "Sorry I am late messaging you on this matter.  On 02/10/21, I started another round of prednisone for 5 days which I finished this morning. Previously, I took the same on 01/25/21.  I believe this to have been caused by the dry leaf dust but not sure.  Problem seems to be first thing in the morning and then again around 7 in the evening.  I, also, did several breathing treatments.  Not sure whether you would like to do a telephone conversation or not. I won't be available Thursday afternoon.  I will need another backup prescription for the prednisone.   Thank you, Elaine [name]  [birth year]"  Dr. Loanne Drilling, can you please advise? Thanks!

## 2021-03-19 ENCOUNTER — Encounter (HOSPITAL_COMMUNITY): Payer: Medicare Other

## 2021-04-17 ENCOUNTER — Encounter (HOSPITAL_COMMUNITY): Payer: Self-pay | Admitting: *Deleted

## 2021-04-17 ENCOUNTER — Emergency Department (HOSPITAL_COMMUNITY)
Admission: EM | Admit: 2021-04-17 | Discharge: 2021-04-17 | Disposition: A | Discharge status: Home / Self Care | Payer: Medicare Other | Attending: Emergency Medicine | Admitting: Emergency Medicine

## 2021-04-17 ENCOUNTER — Encounter: Payer: Self-pay | Admitting: Pulmonary Disease

## 2021-04-17 ENCOUNTER — Emergency Department (HOSPITAL_COMMUNITY): Payer: Medicare Other

## 2021-04-17 ENCOUNTER — Telehealth: Payer: Self-pay | Admitting: Student

## 2021-04-17 DIAGNOSIS — R042 Hemoptysis: Secondary | ICD-10-CM | POA: Diagnosis present

## 2021-04-17 DIAGNOSIS — Z7951 Long term (current) use of inhaled steroids: Secondary | ICD-10-CM | POA: Diagnosis not present

## 2021-04-17 DIAGNOSIS — Z7982 Long term (current) use of aspirin: Secondary | ICD-10-CM | POA: Insufficient documentation

## 2021-04-17 DIAGNOSIS — Z79899 Other long term (current) drug therapy: Secondary | ICD-10-CM | POA: Diagnosis not present

## 2021-04-17 DIAGNOSIS — J449 Chronic obstructive pulmonary disease, unspecified: Secondary | ICD-10-CM | POA: Insufficient documentation

## 2021-04-17 LAB — CBC
HCT: 45.2 % (ref 36.0–46.0)
Hemoglobin: 15.3 g/dL — ABNORMAL HIGH (ref 12.0–15.0)
MCH: 33 pg (ref 26.0–34.0)
MCHC: 33.8 g/dL (ref 30.0–36.0)
MCV: 97.4 fL (ref 80.0–100.0)
Platelets: 248 10*3/uL (ref 150–400)
RBC: 4.64 MIL/uL (ref 3.87–5.11)
RDW: 13.3 % (ref 11.5–15.5)
WBC: 7.4 10*3/uL (ref 4.0–10.5)
nRBC: 0 % (ref 0.0–0.2)

## 2021-04-17 LAB — BASIC METABOLIC PANEL
Anion gap: 9 (ref 5–15)
BUN: 17 mg/dL (ref 8–23)
CO2: 26 mmol/L (ref 22–32)
Calcium: 9 mg/dL (ref 8.9–10.3)
Chloride: 105 mmol/L (ref 98–111)
Creatinine, Ser: 0.78 mg/dL (ref 0.44–1.00)
GFR, Estimated: >60 mL/min (ref >60)
Glucose, Bld: 97 mg/dL (ref 70–99)
Potassium: 4.3 mmol/L (ref 3.5–5.1)
Sodium: 140 mmol/L (ref 135–145)

## 2021-04-17 MED ORDER — LEVOFLOXACIN 750 MG PO TABS
750.0000 mg | ORAL_TABLET | Freq: Once | ORAL | Status: AC
  Administered 2021-04-17: 750 mg via ORAL
  Filled 2021-04-17: qty 1

## 2021-04-17 MED ORDER — LEVOFLOXACIN 500 MG PO TABS: 500.0000 mg | ORAL_TABLET | Freq: Every day | ORAL | 0 refills | Status: DC

## 2021-04-17 MED ORDER — IOHEXOL 350 MG/ML SOLN
100.0000 mL | Freq: Once | INTRAVENOUS | Status: AC | PRN
  Administered 2021-04-17: 75 mL via INTRAVENOUS

## 2021-04-17 NOTE — ED Triage Notes (Signed)
Coughing up blood onset last night and states it continued today denies pain

## 2021-04-17 NOTE — ED Provider Notes (Signed)
[facility] EMERGENCY DEPARTMENT Provider Note   CSN: [id number] Arrival date & time: 04/17/21  1230     History  Chief Complaint  Patient presents with   Cough    Elaine [name] is a 76 y.o. female.   Cough Associated symptoms: no chest pain, no chills, no ear pain, no fever, no myalgias, no rash, no shortness of breath, no sore throat and no wheezing   Patient presents for concern of hemoptysis.  She has a history of COPD.  She reports that she chronically has episodes of coughing spells.  Last night, she had a coughing spell and noticed some bloody streaks in her mucus.  This morning, she had a coughing spell that was productive of a small dark blood clot.  Later today, at approximately 10:30 AM, patient had a subsequent coughing spell that was productive of a large amount of bright red blood.  She denies any history of hemoptysis.  Other than this hemoptysis, she has not had any other recent symptoms.  She reports that she does not feel increased shortness of breath.  She has no areas of pain.  She continues to use her daily Ellipta and Flovent.  She has not required any breathing treatments at home.   She is not on any blood thinning medication.    Home Medications Prior to Admission medications   Medication Sig Start Date End Date Taking? Authorizing Provider  albuterol (VENTOLIN HFA) 108 (90 Base) MCG/ACT inhaler Inhale 2 puffs into the lungs every 6 (six) hours as needed for wheezing or shortness of breath. 01/24/21  Yes [name], MD  ANORO ELLIPTA 62.5-25 MCG/INH AEPB INHALE 1 PUFF BY MOUTH DAILY Patient taking differently: Inhale 1 puff into the lungs daily. 06/08/20  Yes [name], MD  aspirin 81 MG EC tablet Take 81 mg by mouth daily. Swallow whole.   Yes [provider]  azithromycin (ZITHROMAX) 250 MG tablet Take 1 tablet (250 mg total) by mouth every Monday, Wednesday, and Friday. 01/24/21  Yes [name], MD  denosumab (PROLIA) 60 MG/ML  SOSY injection Inject 60 mg into the skin every 6 (six) months.    Yes [provider]  fluticasone (FLOVENT HFA) 110 MCG/ACT inhaler TAKE 2 PUFFS BY MOUTH TWICE A DAY 09/18/20  Yes [name], MD  ipratropium-albuterol (DUONEB) 0.5-2.5 (3) MG/3ML SOLN Inhale 3 mLs into the lungs See admin instructions. Use 1 vial via nebulizer daily. May use additional vial if needed at bedtime for shortness of breath. 11/30/19  Yes [name], MD  levofloxacin (LEVAQUIN) 500 MG tablet Take 1 tablet (500 mg total) by mouth daily. 04/17/21  Yes Godfrey Pick, MD  losartan (COZAAR) 50 MG tablet Take 50 mg by mouth daily.   Yes [provider]  Multiple Vitamins-Minerals (PRESERVISION AREDS 2 PO) Take 2 tablets by mouth daily.   Yes [provider]  OXYGEN Inhale 2 L into the lungs continuous.   Yes [provider]  rosuvastatin (CRESTOR) 5 MG tablet Take 5 mg by mouth daily. 02/01/19  Yes [provider]  SYNTHROID 75 MCG tablet Take 75 mcg by mouth daily before breakfast. Rotation between 17mcg one day and 18mcg the next 10/25/17  Yes [provider]  anastrozole (ARIMIDEX) 1 MG tablet Take 1 tablet (1 mg total) by mouth daily. Patient not taking: Reported on 10/16/2020 01/20/18   Gardenia Phlegm, NP  Azelastine HCl 137 MCG/SPRAY SOLN Place into both nostrils. 11/24/20  [provider]  VENTOLIN HFA 108 (90 Base) MCG/ACT inhaler INHALE 2 PUFFS INTO THE LUNGS EVERY 4 (FOUR) HOURS AS NEEDED FOR WHEEZING OR SHORTNESS OF BREATH. Patient not taking: Reported on 04/17/2021 05/08/20   [name], MD      Allergies    Bee venom, Codeine, Macrobid [nitrofurantoin macrocrystal], Morphine and related, Oxycodone, Penicillins, and Tyloxapol    Review of Systems   Review of Systems  Constitutional:  Negative for activity change, appetite change, chills, fatigue and fever.  HENT:  Negative for congestion, ear pain and sore throat.   Eyes:   Negative for pain and visual disturbance.  Respiratory:  Positive for cough. Negative for chest tightness, shortness of breath, wheezing and stridor.   Cardiovascular:  Negative for chest pain, palpitations and leg swelling.  Gastrointestinal:  Negative for abdominal pain, diarrhea, nausea and vomiting.  Genitourinary:  Negative for dysuria, flank pain and hematuria.  Musculoskeletal:  Negative for arthralgias, back pain, myalgias and neck pain.  Skin:  Negative for color change and rash.  Neurological:  Negative for dizziness, seizures, syncope, weakness, light-headedness and numbness.  Hematological:  Does not bruise/bleed easily.  Psychiatric/Behavioral:  Negative for confusion and decreased concentration.   All other systems reviewed and are negative.  Physical Exam Updated Vital Signs BP (!) 153/79    Pulse 76    Temp 97.6 F (36.4 C) (Oral)    Resp 14    SpO2 94%  Physical Exam Vitals and nursing note reviewed.  Constitutional:      General: She is not in acute distress.    Appearance: Normal appearance. She is well-developed. She is not ill-appearing, toxic-appearing or diaphoretic.  HENT:     Head: Normocephalic and atraumatic.     Right Ear: External ear normal.     Left Ear: External ear normal.     Nose: Nose normal.     Mouth/Throat:     Mouth: Mucous membranes are moist.     Pharynx: Oropharynx is clear.     Comments: No evidence of lesions or blood in oropharynx Eyes:     Conjunctiva/sclera: Conjunctivae normal.  Cardiovascular:     Rate and Rhythm: Normal rate and regular rhythm.     Heart sounds: No murmur heard. Pulmonary:     Effort: Pulmonary effort is normal. No respiratory distress.     Breath sounds: Normal breath sounds. No wheezing or rales.  Chest:     Chest wall: No tenderness.  Abdominal:     Palpations: Abdomen is soft.     Tenderness: There is no abdominal tenderness.  Musculoskeletal:        General: No swelling.     Cervical back: Normal  range of motion and neck supple. No rigidity.     Right lower leg: No edema.     Left lower leg: No edema.  Skin:    General: Skin is warm and dry.     Capillary Refill: Capillary refill takes less than 2 seconds.     Coloration: Skin is not jaundiced or pale.  Neurological:     General: No focal deficit present.     Mental Status: She is alert and oriented to person, place, and time.     Cranial Nerves: No cranial nerve deficit.     Sensory: No sensory deficit.     Motor: No weakness.     Coordination: Coordination normal.  Psychiatric:        Mood and Affect: Mood normal.  Behavior: Behavior normal.    ED Results / Procedures / Treatments   Labs (all labs ordered are listed, but only abnormal results are displayed) Labs Reviewed  CBC - Abnormal; Notable for the following components:      Result Value   Hemoglobin 15.3 (*)    All other components within normal limits  BASIC METABOLIC PANEL    EKG None  Radiology DG Chest 2 View  Result Date: 04/17/2021 CLINICAL DATA:  Coughing up blood.  Shortness of breath. EXAM: CHEST - 2 VIEW COMPARISON:  CT chest 05/05/2020 FINDINGS: Cardiac silhouette and mediastinal contours are unchanged and within normal limits. Mild calcification within aortic arch. There is again flattening of the diaphragms. There is centrilobular emphysematous change with cystic changes again seen within the upper lungs. Moderate biapical pleural scarring is similar to prior. Mild density overlying the right costophrenic angle may represent scarring, as no definite pleural effusion is seen on lateral view. No pneumothorax. Mild levocurvature of the thoracic spine. Moderate multilevel degenerative disc changes. Right axillary surgical clips are again noted. IMPRESSION: No active cardiopulmonary disease.  COPD. Electronically Signed   By: Yvonne Kendall M.D.   On: 04/17/2021 14:17   CT Angio Chest PE W and/or Wo Contrast  Result Date: 04/17/2021 CLINICAL DATA:   Hemoptysis since yesterday, history of breast cancer EXAM: CT ANGIOGRAPHY CHEST WITH CONTRAST TECHNIQUE: Multidetector CT imaging of the chest was performed using the standard protocol during bolus administration of intravenous contrast. Multiplanar CT image reconstructions and MIPs were obtained to evaluate the vascular anatomy. RADIATION DOSE REDUCTION: This exam was performed according to the departmental dose-optimization program which includes automated exposure control, adjustment of the mA and/or kV according to patient size and/or use of iterative reconstruction technique. CONTRAST:  64mL OMNIPAQUE IOHEXOL 350 MG/ML SOLN COMPARISON:  05/05/2020, 04/17/2021 FINDINGS: Cardiovascular: This is a technically adequate evaluation of the pulmonary vasculature. No filling defects or pulmonary emboli. The heart is unremarkable without pericardial effusion. No evidence of thoracic aortic aneurysm or dissection. Moderate atherosclerosis of the aortic arch. Mediastinum/Nodes: No enlarged mediastinal, hilar, or axillary lymph nodes. Thyroid gland, trachea, and esophagus demonstrate no significant findings. Small hiatal hernia. Lungs/Pleura: Diffuse upper lobe predominant emphysema. Patchy left upper lobe airspace disease consistent with bronchopneumonia. 5 mm ground-glass nodule within the left lower lobe reference image 105/8 is new since prior exam. This could be infectious given the left upper lobe findings. Chronic scarring within the lingula, right middle lobe, and bilateral lower lobes. There is mild bilateral bronchial wall thickening, most pronounced in the left upper and bilateral lower lobes. Upper Abdomen: No acute abnormality. Musculoskeletal: Chronic appearing compression deformity inferior endplate T12 vertebral body and healed right twelfth rib fracture, new since 05/05/2020. No other acute displaced fractures. Stable multilevel thoracic spondylosis. Review of the MIP images confirms the above findings.  IMPRESSION: 1. Left upper lobe airspace disease consistent with bronchopneumonia. 2. No evidence of pulmonary embolus. 3. 5 mm ground-glass nodule left lower lobe, new since prior exam, likely related to infection given the left upper lobe findings. Follow-up CT in 6 months may be useful to assess interval resolution. 4. Bilateral bronchial wall thickening consistent with reactive airway disease or bronchitis. 5. Aortic Atherosclerosis (ICD10-I70.0) and Emphysema (ICD10-J43.9). 6. Small hiatal hernia. Electronically Signed   By: Randa Ngo M.D.   On: 04/17/2021 17:03    Procedures Procedures    Medications Ordered in ED Medications  iohexol (OMNIPAQUE) 350 MG/ML injection 100 mL (75 mLs Intravenous Contrast Given  04/17/21 1642)  levofloxacin (LEVAQUIN) tablet 750 mg (750 mg Oral Given 04/17/21 1959)    ED Course/ Medical Decision Making/ A&P                           Medical Decision Making Amount and/or Complexity of Data Reviewed Labs: ordered. Radiology: ordered.  Risk Prescription drug management.   This patient presents to the ED for concern of hemoptysis, this involves an extensive number of treatment options, and is a complaint that carries with it a high risk of complications and morbidity.  The differential diagnosis includes neoplasm, PE, pneumonia, epistaxis   Co morbidities that complicate the patient evaluation  COPD, remote breast cancer, HTN, osteoporosis   Additional history obtained:  Additional history obtained from patient's husband External records from outside source obtained and reviewed including EMR   Lab Tests:  I Ordered, and personally interpreted labs.  The pertinent results include: Normal hemoglobin, no leukocytosis, normal electrolytes, normal kidney function   Imaging Studies ordered:  I ordered imaging studies including chest x-ray and CTA of chest I independently visualized and interpreted imaging which showed new left upper lobe  airspace disease and new left lower lobe nodule I agree with the radiologist interpretation   Cardiac Monitoring:  The patient was maintained on a cardiac monitor.  I personally viewed and interpreted the cardiac monitored which showed an underlying rhythm of: Sinus rhythm    Medicines ordered and prescription drug management:  I ordered medication including prescription for Levaquin  for treatment of bronchopneumonia I have reviewed the patients home medicines and have made adjustments as needed  Consultations Obtained:  I requested consultation with the pulmonologist,  and discussed lab and imaging findings as well as pertinent plan - they recommend: Discharged on antibiotics (they agree with Levaquin), and outpatient follow-up   Problem List / ED Course:  Very pleasant 76 year old female presenting for several episodes of hemoptysis.  She reports that the most recent episode occurred at 10:30 AM and consisted of several ounces of bright red blood.  She has no history of the same.  She does have COPD and is followed by Dr. Loanne Drilling with pulmonology.  Other than these episodes, she denies any new symptoms.  These episodes were in the setting of coughing spells, which she states is typical for her when she exerts herself.  She is well-appearing on arrival in the ED.  Her vital signs are normal.  Lab work was ordered which did not show any drop in her hemoglobin.  It also did not show any leukocytosis.  She has normal kidney function and a CTA of chest was ordered.  Results of the study show a new airspace opacity in left upper lobe and a new pulmonary nodule in the left lower lobe.  These findings can be consistent with bronchopneumonia which would explain her hemoptysis.  I did discuss this case with pulmonologist on-call.  He agreed with plan to initiate antibiotics and for patient to follow-up as an outpatient.  During her 7-hour observation period in the ED, she did not have any further  episodes of hemoptysis.  She remained well-appearing with normal vital signs.  Given her allergy profile, Levaquin was prescribed.  Patient was given strict return precautions to return to the ED if she does experience any further episodes.  She is otherwise appropriate for discharge with follow-up.   Reevaluation:  After the interventions noted above, I reevaluated the patient and found that  they have :stayed the same   Social Determinants of Health:  Patient has access to outpatient medical care, including pulmonology office.   Dispostion:  After consideration of the diagnostic results and the patients response to treatment, I feel that the patent would benefit from discharge with close follow-up.          Final Clinical Impression(s) / ED Diagnoses Final diagnoses:  Hemoptysis    Rx / DC Orders ED Discharge Orders          Ordered    levofloxacin (LEVAQUIN) 500 MG tablet  Daily        04/17/21 1947              Godfrey Pick, MD 04/19/21 1337

## 2021-04-17 NOTE — ED Notes (Signed)
Pt to bathroom via wheelchair.

## 2021-04-17 NOTE — ED Provider Triage Note (Signed)
Emergency Medicine Provider Triage Evaluation Note  Elaine [name] , a 76 y.o. female  was evaluated in triage.  Pt complains of coughing up blood.  Pt reports this started today.  Pt reports she feels blood in her throat and then coughs it up.   Review of Systems  Positive: Cough hx of copd Negative: No blood thinners  Physical Exam  BP (!) 166/79    Pulse 71    Temp 97.6 F (36.4 C) (Oral)    Resp 20    SpO2 94%  Gen:   Awake, no distress   Resp:  Normal effort  MSK:   Moves extremities without difficulty  Other:    Medical Decision Making  Medically screening exam initiated at 2:38 PM.  Appropriate orders placed.  Aviendha MCKINSEY KEAGLE was informed that the remainder of the evaluation will be completed by another provider, this initial triage assessment does not replace that evaluation, and the importance of remaining in the ED until their evaluation is complete.     Fransico Meadow, [place] 04/17/21 1440

## 2021-04-17 NOTE — Telephone Encounter (Signed)
She had been doing well until around 10:30 pm and had a lot of blood and she is on the way to the hospital at this time. This is an Pharmacist, hospital for the provider.

## 2021-04-17 NOTE — Telephone Encounter (Signed)
Noted. Called patient who is in the waiting room and still having symptoms. I agreed with ED visit and encouraged patient to wait for evaluation.

## 2021-04-17 NOTE — Telephone Encounter (Signed)
75yF with hemoptysis that has subsided a bit. Per ED report does not sound like she's in exacerbation. On room air comfortable, alert. CTA Chest looks like a LUL pneumonia. We will try a course of levaquin. She is to hold aspirin if she's taking it. Dr. Doren Custard will provide return to ED precautions for worsening hemoptysis. Can we arrange clinic follow up in the next 2 weeks with Dr. Loanne Drilling?  Thanks!

## 2021-04-18 NOTE — Telephone Encounter (Signed)
Dr. Loanne Drilling,  Please see mychart message sent by pt and advise.

## 2021-04-18 NOTE — Telephone Encounter (Signed)
Thanks Nate. Scheduled in clinic with me in Feb 2023.

## 2021-04-19 NOTE — Telephone Encounter (Signed)
I have availability on Feb 8th. If her symptoms return, we can arrange to see her sooner if needed.

## 2021-04-19 NOTE — Telephone Encounter (Signed)
Called and spoke with pt letting her know the info stated by Dr. Loanne Drilling. Pt stated that she already had a f/u scheduled which I checked and saw that her appt is currently 2/15. Pt said she will keep that appt as scheduled unless if symptoms started up again then she would call us for a sooner appt.nothing further needed.

## 2021-04-25 ENCOUNTER — Ambulatory Visit (INDEPENDENT_AMBULATORY_CARE_PROVIDER_SITE_OTHER): Payer: Medicare Other

## 2021-04-25 ENCOUNTER — Ambulatory Visit (INDEPENDENT_AMBULATORY_CARE_PROVIDER_SITE_OTHER): Payer: Medicare Other | Admitting: Primary Care

## 2021-04-25 ENCOUNTER — Telehealth: Payer: Self-pay | Admitting: Pulmonary Disease

## 2021-04-25 ENCOUNTER — Encounter: Payer: Self-pay | Admitting: Primary Care

## 2021-04-25 ENCOUNTER — Telehealth: Payer: Self-pay | Admitting: Primary Care

## 2021-04-25 ENCOUNTER — Other Ambulatory Visit: Payer: Self-pay

## 2021-04-25 ENCOUNTER — Other Ambulatory Visit (INDEPENDENT_AMBULATORY_CARE_PROVIDER_SITE_OTHER): Payer: Medicare Other

## 2021-04-25 VITALS — BP 140/62 | HR 76 | Temp 97.5°F | Ht 63.0 in | Wt 145.0 lb

## 2021-04-25 DIAGNOSIS — J432 Centrilobular emphysema: Secondary | ICD-10-CM | POA: Diagnosis not present

## 2021-04-25 DIAGNOSIS — J180 Bronchopneumonia, unspecified organism: Secondary | ICD-10-CM | POA: Insufficient documentation

## 2021-04-25 DIAGNOSIS — J449 Chronic obstructive pulmonary disease, unspecified: Secondary | ICD-10-CM | POA: Diagnosis not present

## 2021-04-25 DIAGNOSIS — R042 Hemoptysis: Secondary | ICD-10-CM

## 2021-04-25 LAB — CBC WITH DIFFERENTIAL/PLATELET
Basophils Absolute: 0.1 10*3/uL (ref 0.0–0.1)
Basophils Relative: 0.6 % (ref 0.0–3.0)
Eosinophils Absolute: 0.3 10*3/uL (ref 0.0–0.7)
Eosinophils Relative: 3.3 % (ref 0.0–5.0)
HCT: 42.3 % (ref 36.0–46.0)
Hemoglobin: 14.5 g/dL (ref 12.0–15.0)
Lymphocytes Relative: 28.4 % (ref 12.0–46.0)
Lymphs Abs: 2.6 10*3/uL (ref 0.7–4.0)
MCHC: 34.4 g/dL (ref 30.0–36.0)
MCV: 94.1 fl (ref 78.0–100.0)
Monocytes Absolute: 0.8 10*3/uL (ref 0.1–1.0)
Monocytes Relative: 8.8 % (ref 3.0–12.0)
Neutro Abs: 5.5 10*3/uL (ref 1.4–7.7)
Neutrophils Relative %: 58.9 % (ref 43.0–77.0)
Platelets: 257 10*3/uL (ref 150.0–400.0)
RBC: 4.5 Mil/uL (ref 3.87–5.11)
RDW: 13.9 % (ref 11.5–15.5)
WBC: 9.3 10*3/uL (ref 4.0–10.5)

## 2021-04-25 MED ORDER — PREDNISONE 20 MG PO TABS: 40.0000 mg | ORAL_TABLET | Freq: Every day | ORAL | 0 refills | Status: DC

## 2021-04-25 NOTE — Telephone Encounter (Signed)
Magdalen Spatz, NP  You 11 minutes ago (11:05 AM)   Coughing up that much blood is not normal. She needs to be seen with a CXR  . She also needs a CBC. She will also need to keep the appointment on 2/15. If she starts coughing up more blood before she can be seen , she  needs to seek emergency care   Called and spoke with pt letting her know the info per SG and she verbalized understanding. Pt was able to arrange a ride so she could come in this afternoon 1/25 for an appt. Stated to her to arrive early for cxr prior and she verbalized understanding. Nothing further needed.

## 2021-04-25 NOTE — Telephone Encounter (Signed)
Patient needs to be set up for bronch with Dr. Loanne Drilling at [facility]. next week. She will need moderate sedation. BAL no fluro. She is not on blood thinner. Patient has been advised to stop baby aspirin. Aware to be NPO after midnight day prior to procedure.

## 2021-04-25 NOTE — Assessment & Plan Note (Addendum)
-   Patient originally presents to ED on 04/17/21 with hemoptysis. CTA showed new airspace opacity left upper lobe lobe pulmonary nodule in the left lower lobe. She was treated for suspected bronchopneumonia with Levaquin 500mg  qd x 7 days. She does  continues to have intermittent hemoptysis, last episode on 04/22/21 with approx 120cc amount. Repeat CXR today showed worsening patchy infiltrate in the left upper lung fields. Discussed with Dr. Loanne Drilling, needs to be set up from bronchoscopy next week at [facility]. Discussed risk of procedure including bleeding, infection, respi failure or pneumothorax. Advised she stop baby ASA and will need to be NPO at midnight day before procedure. May need IR for embolization. Sending in Prednisone 40mg  x 5 days and advised she take Delsym cough suppressant q 12 hours. Patient has been given instructions to present to ED if hemoptysis occurs getting up >120cc amount.

## 2021-04-25 NOTE — H&P (View-Only) (Signed)
Please let patient know Hgb was stable

## 2021-04-25 NOTE — Telephone Encounter (Signed)
Started taking levofloxacin 1 week ago and took last dose yesterday 1/24.  Coughed up blood 1/22 which she said was chunks and clots and also had liquid in it too. States that it was dark in color. States that she did cough up some chunks today 1/25 as well which she stated was dark as well almost like it was old blood.  I did offer to get pt scheduled for an appt to have this further discussed but pt did not want to make an appt as she feels fine.  Pt said that other than doing this, she has no other symptoms at all and states that she feels completely fine. What pt wants to know is, with her diagnosis of bronchial pneumonia, is this normal for her to be coughing up some blood chunks every now and then.  Pt does not want to schedule an appt due to having a follow up already with Dr. Loanne Drilling 2/15. What she wants to know is with that diagnosis, is this normal.  Pt again wanted to state that she feels completely fine.  Sending this to provider of the day with Dr. Loanne Drilling being out of the office today. Sarah, please advise.

## 2021-04-25 NOTE — Progress Notes (Signed)
Please let patient know Hgb was stable

## 2021-04-25 NOTE — Assessment & Plan Note (Signed)
-   COPD symptoms do not appear acute exacerbated  - Continue Anoro Ellipta and Flovent 2 puffs BID

## 2021-04-25 NOTE — Progress Notes (Signed)
@Patient  ID: Elaine [name], female    DOB: 1945/10/25, 76 y.o.   MRN: [id number]  Chief Complaint  Patient presents with   Follow-up    CXR review     Referring provider: Quentin Cornwall, MD  HPI: 76 year old female, current smoked. PMG significant COPD mixed type- GOLD C, emphysema, breast cancer. Patient of Dr. Loanne Drilling. Maintained on Flovent 2 puffs BID, Anoro Ellipta and Azithromycin MWF.   04/25/2021 Patient presents today for acute office visit with complaints of hemoptysis.  She was seen in the emergency room on 04/17/2021 for hemoptysis. CTA 04/17/21 showed new airspace opacity left upper lobe lobe pulmonary nodule in the left lower lobe.  Findings felt to be consistent with bronchopneumonia.  Hemoglobin was stable at the time.  Patient was prescribed Levaquin which she finished yesterday. Hemoptysis stabilized until Sunday 1/22 when she coughed up approx 1/2 cup, since Sunday she has not had any hemoptysis. She reports getting choked at times when eating. Rare wheezing with exertion. She is taking azithromycin MWF. She is not on a blood thinner. No shortness of breath or active chest pain.     Allergies  Allergen Reactions   Bee Venom Other (See Comments)    Unknown   Codeine Nausea Only   Macrobid [Nitrofurantoin Macrocrystal] Other (See Comments)    Unknown   Morphine And Related Other (See Comments)    Unknown   Oxycodone Nausea Only   Penicillins Other (See Comments)    Unknown Has patient had a PCN reaction causing immediate rash, facial/tongue/throat swelling, SOB or lightheadedness with hypotension: Unknown Has patient had a PCN reaction causing severe rash involving mucus membranes or skin necrosis: Unknown Has patient had a PCN reaction that required hospitalization: Unknown Has patient had a PCN reaction occurring within the last 10 years: No If all of the above answers are "NO", then may proceed with Cephalosporin use.    Tyloxapol Nausea Only and Other (See  Comments)    Extreme Drugged feeling, Doesn't affect pain    Immunization History  Administered Date(s) Administered   Influenza Inj Mdck Quad Pf 01/23/2018   Influenza, High Dose Seasonal PF 01/18/2021   Influenza-Unspecified 01/13/2015, 02/08/2016, 01/16/2017, 01/28/2018   Janssen (J&J) SARS-COV-2 Vaccination 07/01/2019, 02/12/2020   Pneumococcal Polysaccharide-23 08/10/2016   Pneumococcal-Unspecified 08/02/2014   Tdap 08/02/2014    Past Medical History:  Diagnosis Date   Breast cancer ([place]) 1995   left    Cataract    Complication of anesthesia    Emphysema of lung ([place])    Family history of breast cancer    Hiatal hernia 1992   Hypertension    Hyperthyroidism    Osteoporosis    PONV (postoperative nausea and vomiting)     Tobacco History: Social History   Tobacco Use  Smoking Status Every Day   Packs/day: 1.00   Years: 50.00   Pack years: 50.00   Types: Cigarettes   Last attempt to quit: 12/18/2017   Years since quitting: 3.3  Smokeless Tobacco Never  Tobacco Comments   2-5 cigarettes per day-- 06/06/2020   Ready to quit: Not Answered Counseling given: Not Answered Tobacco comments: 2-5 cigarettes per day-- 06/06/2020   Outpatient Medications Prior to Visit  Medication Sig Dispense Refill   albuterol (VENTOLIN HFA) 108 (90 Base) MCG/ACT inhaler Inhale 2 puffs into the lungs every 6 (six) hours as needed for wheezing or shortness of breath. 8 g 3   ANORO ELLIPTA 62.5-25 MCG/INH AEPB INHALE 1 PUFF BY  MOUTH DAILY (Patient taking differently: Inhale 1 puff into the lungs daily.) 180 each 4   aspirin 81 MG EC tablet Take 81 mg by mouth daily. Swallow whole.     Azelastine HCl 137 MCG/SPRAY SOLN Place into both nostrils.     azithromycin (ZITHROMAX) 250 MG tablet Take 1 tablet (250 mg total) by mouth every Monday, Wednesday, and Friday. 39 tablet 0   denosumab (PROLIA) 60 MG/ML SOSY injection Inject 60 mg into the skin every 6 (six) months.      fluticasone (FLOVENT  HFA) 110 MCG/ACT inhaler TAKE 2 PUFFS BY MOUTH TWICE A DAY 3 each 3   losartan (COZAAR) 50 MG tablet Take 50 mg by mouth daily.     Multiple Vitamins-Minerals (PRESERVISION AREDS 2 PO) Take 2 tablets by mouth daily.     OXYGEN Inhale 2 L into the lungs continuous.     rosuvastatin (CRESTOR) 5 MG tablet Take 5 mg by mouth daily.     SYNTHROID 75 MCG tablet Take 75 mcg by mouth daily before breakfast. Rotation between 40mcg one day and 11mcg the next  1   VENTOLIN HFA 108 (90 Base) MCG/ACT inhaler INHALE 2 PUFFS INTO THE LUNGS EVERY 4 (FOUR) HOURS AS NEEDED FOR WHEEZING OR SHORTNESS OF BREATH. 18 each 5   anastrozole (ARIMIDEX) 1 MG tablet Take 1 tablet (1 mg total) by mouth daily. (Patient not taking: Reported on 10/16/2020) 30 tablet 3   ipratropium-albuterol (DUONEB) 0.5-2.5 (3) MG/3ML SOLN Inhale 3 mLs into the lungs See admin instructions. Use 1 vial via nebulizer daily. May use additional vial if needed at bedtime for shortness of breath. (Patient not taking: Reported on 04/25/2021) 360 mL 3   levofloxacin (LEVAQUIN) 500 MG tablet Take 1 tablet (500 mg total) by mouth daily. (Patient not taking: Reported on 04/25/2021) 7 tablet 0   No facility-administered medications prior to visit.    Review of Systems  Review of Systems  Constitutional: Negative.  Negative for fever.  HENT: Negative.    Respiratory:  Positive for cough. Negative for chest tightness, shortness of breath and wheezing.   Cardiovascular: Negative.     Physical Exam  BP 140/62 (BP Location: Left Arm, Cuff Size: Normal)    Pulse 76    Temp (!) 97.5 F (36.4 C) (Oral)    Ht 5\' 3"  (1.6 m)    Wt 145 lb (65.8 kg)    SpO2 95%    BMI 25.69 kg/m  Physical Exam Constitutional:      General: She is not in acute distress.    Appearance: Normal appearance. She is not ill-appearing, toxic-appearing or diaphoretic.  HENT:     Head: Normocephalic and atraumatic.  Cardiovascular:     Rate and Rhythm: Normal rate and regular rhythm.   Pulmonary:     Effort: Pulmonary effort is normal.     Breath sounds: Normal breath sounds. No wheezing, rhonchi or rales.     Comments: CTA, slightly diminished. No overt wheezing, rales or rhonchi. O2 95% RA Neurological:     General: No focal deficit present.     Mental Status: She is alert and oriented to person, place, and time. Mental status is at baseline.  Psychiatric:        Mood and Affect: Mood normal.        Behavior: Behavior normal.        Thought Content: Thought content normal.        Judgment: Judgment normal.  Lab Results:  CBC    Component Value Date/Time   WBC 9.3 04/25/2021 1610   RBC 4.50 04/25/2021 1610   HGB 14.5 04/25/2021 1610   HGB 16.7 (H) 11/06/2017 1448   HCT 42.3 04/25/2021 1610   PLT 257.0 04/25/2021 1610   PLT 210 11/06/2017 1448   MCV 94.1 04/25/2021 1610   MCH 33.0 04/17/2021 1417   MCHC 34.4 04/25/2021 1610   RDW 13.9 04/25/2021 1610   LYMPHSABS 2.6 04/25/2021 1610   MONOABS 0.8 04/25/2021 1610   EOSABS 0.3 04/25/2021 1610   BASOSABS 0.1 04/25/2021 1610    BMET    Component Value Date/Time   NA 140 04/17/2021 1417   K 4.3 04/17/2021 1417   CL 105 04/17/2021 1417   CO2 26 04/17/2021 1417   GLUCOSE 97 04/17/2021 1417   BUN 17 04/17/2021 1417   CREATININE 0.78 04/17/2021 1417   CREATININE 0.98 11/06/2017 1448   CALCIUM 9.0 04/17/2021 1417   GFRNONAA >60 04/17/2021 1417   GFRNONAA 56 (L) 11/06/2017 1448   GFRAA >60 04/28/2018 1312   GFRAA >60 11/06/2017 1448    BNP No results found for: BNP  ProBNP No results found for: PROBNP  Imaging: DG Chest 2 View  Result Date: 04/25/2021 CLINICAL DATA:  Hemoptysis EXAM: CHEST - 2 VIEW COMPARISON:  Previous studies including the examination of 04/17/2021 FINDINGS: Cardiac size is within normal limits. Low position of diaphragms suggests COPD. There is patchy infiltrate in the left upper lung fields with interval worsening. There is no pleural effusion or pneumothorax. Surgical  clips are seen in the right chest wall IMPRESSION: COPD. Patchy infiltrates are seen in the left upper lung fields suggesting pneumonia with slight interval worsening. Electronically Signed   By: Elmer Picker M.D.   On: 04/25/2021 14:48   DG Chest 2 View  Result Date: 04/17/2021 CLINICAL DATA:  Coughing up blood.  Shortness of breath. EXAM: CHEST - 2 VIEW COMPARISON:  CT chest 05/05/2020 FINDINGS: Cardiac silhouette and mediastinal contours are unchanged and within normal limits. Mild calcification within aortic arch. There is again flattening of the diaphragms. There is centrilobular emphysematous change with cystic changes again seen within the upper lungs. Moderate biapical pleural scarring is similar to prior. Mild density overlying the right costophrenic angle may represent scarring, as no definite pleural effusion is seen on lateral view. No pneumothorax. Mild levocurvature of the thoracic spine. Moderate multilevel degenerative disc changes. Right axillary surgical clips are again noted. IMPRESSION: No active cardiopulmonary disease.  COPD. Electronically Signed   By: Yvonne Kendall M.D.   On: 04/17/2021 14:17   CT Angio Chest PE W and/or Wo Contrast  Result Date: 04/17/2021 CLINICAL DATA:  Hemoptysis since yesterday, history of breast cancer EXAM: CT ANGIOGRAPHY CHEST WITH CONTRAST TECHNIQUE: Multidetector CT imaging of the chest was performed using the standard protocol during bolus administration of intravenous contrast. Multiplanar CT image reconstructions and MIPs were obtained to evaluate the vascular anatomy. RADIATION DOSE REDUCTION: This exam was performed according to the departmental dose-optimization program which includes automated exposure control, adjustment of the mA and/or kV according to patient size and/or use of iterative reconstruction technique. CONTRAST:  65mL OMNIPAQUE IOHEXOL 350 MG/ML SOLN COMPARISON:  05/05/2020, 04/17/2021 FINDINGS: Cardiovascular: This is a  technically adequate evaluation of the pulmonary vasculature. No filling defects or pulmonary emboli. The heart is unremarkable without pericardial effusion. No evidence of thoracic aortic aneurysm or dissection. Moderate atherosclerosis of the aortic arch. Mediastinum/Nodes: No enlarged mediastinal, hilar, or  axillary lymph nodes. Thyroid gland, trachea, and esophagus demonstrate no significant findings. Small hiatal hernia. Lungs/Pleura: Diffuse upper lobe predominant emphysema. Patchy left upper lobe airspace disease consistent with bronchopneumonia. 5 mm ground-glass nodule within the left lower lobe reference image 105/8 is new since prior exam. This could be infectious given the left upper lobe findings. Chronic scarring within the lingula, right middle lobe, and bilateral lower lobes. There is mild bilateral bronchial wall thickening, most pronounced in the left upper and bilateral lower lobes. Upper Abdomen: No acute abnormality. Musculoskeletal: Chronic appearing compression deformity inferior endplate T12 vertebral body and healed right twelfth rib fracture, new since 05/05/2020. No other acute displaced fractures. Stable multilevel thoracic spondylosis. Review of the MIP images confirms the above findings. IMPRESSION: 1. Left upper lobe airspace disease consistent with bronchopneumonia. 2. No evidence of pulmonary embolus. 3. 5 mm ground-glass nodule left lower lobe, new since prior exam, likely related to infection given the left upper lobe findings. Follow-up CT in 6 months may be useful to assess interval resolution. 4. Bilateral bronchial wall thickening consistent with reactive airway disease or bronchitis. 5. Aortic Atherosclerosis (ICD10-I70.0) and Emphysema (ICD10-J43.9). 6. Small hiatal hernia. Electronically Signed   By: Randa Ngo M.D.   On: 04/17/2021 17:03     Assessment & Plan:   Hemoptysis - Patient originally presents to ED on 04/17/21 with hemoptysis. CTA showed new airspace  opacity left upper lobe lobe pulmonary nodule in the left lower lobe. She was treated for suspected bronchopneumonia with Levaquin 500mg  qd x 7 days. She does  continues to have intermittent hemoptysis, last episode on 04/22/21 with approx 120cc amount. Repeat CXR today showed worsening patchy infiltrate in the left upper lung fields. Discussed with Dr. Loanne Drilling, needs to be set up from bronchoscopy next week at [facility]. Discussed risk of procedure including bleeding, infection, respi failure or pneumothorax. Advised she stop baby ASA and will need to be NPO at midnight day before procedure. May need IR for embolization. Sending in Prednisone 40mg  x 5 days and advised she take Delsym cough suppressant q 12 hours. Patient has been given instructions to present to ED if hemoptysis occurs getting up >120cc amount.   COPD mixed type ([place]) - COPD symptoms do not appear acute exacerbated  - Continue Anoro Ellipta and Flovent 2 puffs BID    Martyn Ehrich, NP 04/25/2021

## 2021-04-25 NOTE — Patient Instructions (Addendum)
No need for repeat CT or further antibiotics at this time per Dr. Loanne Drilling  We are going to scheduled you for bronchoscopy with Dr. Loanne Drilling next week at [facility] (nothing to eat or drink after midnight the day prior)  Recommendations: - Take Delsym cough syrup twice daily - Stop aspirin for now  - If coughing up more than half cup bright red blood presents to ED  - Sending in prednisone x 5 days to calm any inflammation down - Continue Anoro one puff daily  - Continue Azithromycin MWF  Orders: - Labs today   Follow-up: - Keep visit with Dr. Loanne Drilling in February    Flexible Bronchoscopy Flexible bronchoscopy is a procedure used to examine the passageways in the lungs. During the procedure, a thin, flexible tool with a camera (bronchoscope) is passed into the mouth or nose, down through the windpipe (trachea), and into the air tubes in the lungs (bronchi). This tool allows the health care provider to look inside the lungs and to take samples for testing, if needed. Tell a health care provider about: Any allergies you have. All medicines you are taking, including vitamins, herbs, eye drops, creams, and over-the-counter medicines. Any problems you or family members have had with anesthetic medicines. Any blood disorders you have. Any surgeries you have had. Any medical conditions you have. Whether you are pregnant or may be pregnant. What are the risks? Generally, this is a safe procedure. However, problems may occur, including: Infection. Bleeding. Damage to other structures or organs. Allergic reactions to medicines. Collapsed lung (pneumothorax). Increased need for oxygen or difficulty breathing after the procedure. What happens before the procedure? Staying hydrated Follow instructions from your health care provider about hydration, which may include: Up to 2 hours before the procedure - you may continue to drink clear liquids, such as water, clear fruit juice, black coffee,  and plain tea.  Eating and drinking restrictions Follow instructions from your health care provider about eating and drinking, which may include: 8 hours before the procedure - stop eating heavy meals or foods, such as meat, fried foods, or fatty foods. 6 hours before the procedure - stop eating light meals or foods, such as toast or cereal. 6 hours before the procedure - stop drinking milk or drinks that contain milk. 2 hours before the procedure - stop drinking clear liquids. Medicines Ask your health care provider about: Changing or stopping your regular medicines. This is especially important if you are taking diabetes medicines or blood thinners. Taking medicines such as aspirin and ibuprofen. These medicines can thin your blood. Do not take these medicines unless your health care provider tells you to take them. Taking over-the-counter medicines, vitamins, herbs, and supplements. General instructions You may be given antibiotic medicine to help lower the risk of infection. Plan to have a responsible adult take you home from the hospital or clinic. If you will be going home right after the procedure, plan to have a responsible adult care for you for the time you are told. This is important. What happens during the procedure? An IV will be inserted into one of your veins. You will be given a medicine (local anesthetic) to numb your mouth, nose, throat, and voice box (larynx). You may also be given one or more of the following: A medicine to help you relax (sedative). A medicine to control coughing. A medicine to dry up any fluids or secretions in your lungs. A bronchoscope will be passed into your nose or mouth,  and into your lungs. Your health care provider will examine your lungs. Samples of airway secretions may be collected for testing. If abnormal areas are seen in your airways, samples of tissue may be removed and checked under a microscope (biopsy). If tissue samples are needed  from the outer parts of the lung, a type of X-ray (fluoroscopy) may be used to guide the bronchoscope to these areas. If bleeding occurs, you may be given medicine to stop or decrease the bleeding. The procedure may vary among health care providers and hospitals. What can I expect after the procedure? Your blood pressure, heart rate, breathing rate, and blood oxygen level will be monitored until you leave the hospital or clinic. You may have a chest X-ray to check for signs of pneumothorax. You willnot be allowed to eat or drink anything for 2 hours after your procedure. If a biopsy was taken, it is up to you to get the results of the test. Ask your health care provider, or the department that is doing the procedure, when your results will be ready. You may have the following symptoms for 24-48 hours: A cough that is worse than it was before the procedure. A low-grade fever. A sore throat or hoarse voice. Some blood in the mucus from your lungs (sputum), if a biopsy was done. Follow these instructions at home: Eating and drinking Do not eat or drink anything, including water, for 2 hours after your procedure, or until your numbing medicine has worn off. Having a numb throat increases your risk of burning yourself or choking. Start eating soft foods and slowly drinking liquids after your numbness is gone and your cough and gag reflexes have returned. You may return to your normal diet the day after the procedure. Driving If you were given a sedative during the procedure, it can affect you for several hours. Do not drive or operate machinery until your health care provider says that it is safe. Ask your health care provider if the medicine prescribed to you requires you to avoid driving or using machinery. Return to your normal activities as told by your health care provider. Ask your health care provider what activities are safe for you. General instructions  Take over-the-counter and  prescription medicines only as told by your health care provider. Do not use any products that contain nicotine or tobacco. These products include cigarettes, chewing tobacco, and vaping devices, such as e-cigarettes. If you need help quitting, ask your health care provider. Keep all follow-up visits. This is important. Get help right away if: You have shortness of breath that gets worse. You become light-headed or feel like you might faint. You have chest pain. You cough up more than a small amount of blood. These symptoms may represent a serious problem that is an emergency. Do not wait to see if the symptoms will go away. Get medical help right away. Call your local emergency services (911 in the [place] Do not drive yourself to the hospital. Summary Flexible bronchoscopy is a procedure that allows your health care provider to look closely inside your lungs and to take testing samples if needed. Risks of flexible bronchoscopy include bleeding, infection, and collapsed lung (pneumothorax). Before the procedure, you will be given a medicine to numb your mouth, nose, throat, and voice box. Then, a bronchoscope will be passed into your nose or mouth, and into your lungs. After the procedure, your blood pressure, heart rate, breathing rate, and blood oxygen level will be monitored until you  leave the hospital or clinic. You may have a chest X-ray to check for signs of pneumothorax. You will not be allowed to eat or drink anything for 2 hours after your procedure. This information is not intended to replace advice given to you by your health care provider. Make sure you discuss any questions you have with your health care provider. Document Revised: 11/29/2020 Document Reviewed: 10/07/2019 Elsevier Patient Education  [place].

## 2021-04-26 ENCOUNTER — Encounter: Payer: Self-pay | Admitting: Pulmonary Disease

## 2021-04-26 NOTE — Progress Notes (Signed)
These results were shared with the patient . She was seen in the office by Geraldo Pitter 04/25/2021. She is scheduled for bronch next week, and she has been told to seek emergency care for any worsening of bronchiectasis.

## 2021-04-27 NOTE — Telephone Encounter (Signed)
Called and spoke with patient. Let them know their Bronch is scheduled for 05/01/21 with Dr. Loanne Drilling at 1:30.  Patient was instructed to arrive at hospital at 11:30. They were instructed to bring someone with them as they will not be able to drive home from procedure. Patient instructed not to have anything to eat or drink after midnight. Patient needs to hold their blood thinner Aspirin , 2 days prior to procedure.   Patient's covid screening is going to be done in [place], [place] where she lives. If she can't get it done she is going to call me back.  Patient voiced understanding, nothing further needed  Routing to Dr. Loanne Drilling as [name]

## 2021-05-01 ENCOUNTER — Ambulatory Visit (HOSPITAL_COMMUNITY)
Admission: RE | Admit: 2021-05-01 | Discharge: 2021-05-01 | Disposition: A | Discharge status: Home / Self Care | Payer: Medicare Other | Source: Ambulatory Visit | Attending: Pulmonary Disease | Admitting: Pulmonary Disease

## 2021-05-01 ENCOUNTER — Encounter (HOSPITAL_COMMUNITY): Payer: Self-pay | Admitting: Pulmonary Disease

## 2021-05-01 ENCOUNTER — Encounter (HOSPITAL_COMMUNITY)
Admission: RE | Disposition: A | Discharge status: Home / Self Care | Payer: Self-pay | Source: Ambulatory Visit | Attending: Pulmonary Disease

## 2021-05-01 ENCOUNTER — Other Ambulatory Visit: Payer: Self-pay

## 2021-05-01 DIAGNOSIS — J9811 Atelectasis: Secondary | ICD-10-CM | POA: Insufficient documentation

## 2021-05-01 DIAGNOSIS — J439 Emphysema, unspecified: Secondary | ICD-10-CM | POA: Insufficient documentation

## 2021-05-01 HISTORY — PX: BRONCHIAL WASHINGS: SHX5105

## 2021-05-01 HISTORY — PX: VIDEO BRONCHOSCOPY: SHX5072

## 2021-05-01 LAB — BODY FLUID CELL COUNT WITH DIFFERENTIAL
Eos, Fluid: 0 %
Lymphs, Fluid: 11 %
Monocyte-Macrophage-Serous Fluid: 5 % — ABNORMAL LOW (ref 50–90)
Neutrophil Count, Fluid: 84 % — ABNORMAL HIGH (ref 0–25)
Total Nucleated Cell Count, Fluid: 117 cu mm (ref 0–1000)

## 2021-05-01 SURGERY — VIDEO BRONCHOSCOPY WITHOUT FLUORO
Anesthesia: Moderate Sedation

## 2021-05-01 MED ORDER — BUTAMBEN-TETRACAINE-BENZOCAINE 2-2-14 % EX AERO: 1.0000 | INHALATION_SPRAY | Freq: Once | CUTANEOUS | Status: DC

## 2021-05-01 MED ORDER — MIDAZOLAM HCL (PF) 5 MG/ML IJ SOLN
INTRAMUSCULAR | Status: DC | PRN
  Administered 2021-05-01 (×2): 1 mg via INTRAVENOUS

## 2021-05-01 MED ORDER — FENTANYL CITRATE (PF) 100 MCG/2ML IJ SOLN
INTRAMUSCULAR | Status: DC | PRN
  Administered 2021-05-01 (×4): 50 ug via INTRAVENOUS

## 2021-05-01 MED ORDER — MIDAZOLAM HCL (PF) 5 MG/ML IJ SOLN
INTRAMUSCULAR | Status: AC
  Filled 2021-05-01: qty 2

## 2021-05-01 MED ORDER — BUTAMBEN-TETRACAINE-BENZOCAINE 2-2-14 % EX AERO
INHALATION_SPRAY | CUTANEOUS | Status: DC | PRN
  Administered 2021-05-01: 2 via TOPICAL

## 2021-05-01 MED ORDER — BUTAMBEN-TETRACAINE-BENZOCAINE 2-2-14 % EX AERO
1.0000 | INHALATION_SPRAY | Freq: Once | CUTANEOUS | Status: AC
Start: 2021-05-01 — End: 2021-05-01
  Administered 2021-05-01: 1 via TOPICAL

## 2021-05-01 MED ORDER — PHENYLEPHRINE HCL 0.25 % NA SOLN
1.0000 | Freq: Four times a day (QID) | NASAL | Status: DC | PRN
  Administered 2021-05-01: 1 via NASAL

## 2021-05-01 MED ORDER — PHENYLEPHRINE HCL 0.25 % NA SOLN
NASAL | Status: AC
  Filled 2021-05-01: qty 15

## 2021-05-01 MED ORDER — LIDOCAINE HCL (PF) 4 % IJ SOLN
INTRAMUSCULAR | Status: AC
  Filled 2021-05-01: qty 5

## 2021-05-01 MED ORDER — LIDOCAINE HCL (PF) 4 % IJ SOLN
5.0000 mL | Freq: Once | INTRAMUSCULAR | Status: AC
Start: 2021-05-01 — End: 2021-05-01
  Administered 2021-05-01: 5 mL

## 2021-05-01 MED ORDER — PHENYLEPHRINE HCL 0.25 % NA SOLN: 1.0000 | Freq: Four times a day (QID) | NASAL | Status: DC | PRN

## 2021-05-01 MED ORDER — LIDOCAINE HCL 1 % IJ SOLN
INTRAMUSCULAR | Status: AC
  Filled 2021-05-01: qty 20

## 2021-05-01 MED ORDER — LIDOCAINE HCL 1 % IJ SOLN
INTRAMUSCULAR | Status: DC | PRN
  Administered 2021-05-01: 15 mL via INTRAPLEURAL

## 2021-05-01 MED ORDER — FENTANYL CITRATE (PF) 100 MCG/2ML IJ SOLN
INTRAMUSCULAR | Status: AC
  Filled 2021-05-01: qty 2

## 2021-05-01 MED ORDER — LIDOCAINE HCL URETHRAL/MUCOSAL 2 % EX GEL
CUTANEOUS | Status: AC
  Filled 2021-05-01: qty 30

## 2021-05-01 MED ORDER — LIDOCAINE HCL URETHRAL/MUCOSAL 2 % EX GEL: 1.0000 "application " | Freq: Once | CUTANEOUS | Status: DC

## 2021-05-01 MED ORDER — SODIUM CHLORIDE 0.9 % IV SOLN
INTRAVENOUS | Status: DC
  Administered 2021-05-01: 500 mL via INTRAVENOUS

## 2021-05-01 MED ORDER — LIDOCAINE HCL URETHRAL/MUCOSAL 2 % EX GEL
1.0000 "application " | Freq: Once | CUTANEOUS | Status: AC
  Administered 2021-05-01: 1 via TOPICAL

## 2021-05-01 NOTE — Interval H&P Note (Signed)
History and Physical Interval Note:  2/86/3817 7:11 PM  Leandria ADWOA AXE  has presented today for surgery, with the diagnosis of BAL.  The various methods of treatment have been discussed with the patient and family. After consideration of risks, benefits and other options for treatment, the patient has consented to  Procedure(s): VIDEO BRONCHOSCOPY WITHOUT FLUORO (N/A) as a surgical intervention.  The patient's history has been reviewed, patient examined, no change in status, stable for surgery.  I have reviewed the patient's chart and labs.  Questions were answered to the patient's satisfaction.    She reports cough. No recent hemoptysis since 04/22/21 but has produced dark brown sputum.  CXR 04/25/21 - Patchy LUL infiltrates. COPD CTA 04/17/21 - Left upper lobe infiltrate. Upper lobe predominant emphysema. 5 mm ground-glass nodule   Physical Exam: General: Well-appearing, no acute distress HENT: [place], AT, OP clear, MMM Eyes: EOMI, no scleral icterus Respiratory: Diminished breath sounds bilaterally.  No crackles, wheezing or rales Cardiovascular: RRR, -M/R/G, no JVD Extremities:-Edema,-tenderness Neuro: AAO x4, CNII-XII grossly intact Psych: Normal mood, normal affect  A Hemoptysis Emphysema Abnormal CT  P Flexible bronchoscopy for evaluation   Evalyn Shultis Rodman Pickle, MD

## 2021-05-01 NOTE — Op Note (Addendum)
[facility] Patient Name: Elaine [name] Procedure Date: 05/01/2021 MRN: [id number] Attending MD: Rodman Pickle , MD Date of Birth: 01-24-1946 CSN: [id number] Age: 76 Admit Type: Outpatient Ethnicity: Not Hispanic or Latino Procedure:             Bronchoscopy Indications:           Atelectasis of the left upper lobe Providers:             Rodman Pickle, MD, Jaci Carrel, RN, Cletis [place],                         Technician Referring MD:           Medicines:             Lidocaine 4% Nebulizer 2.5 mL, Lidocaine 2% applied to                         the tracheobronchial tree 8 mL, Lidocaine 2% applied                         to cords 4 mL, Fentanyl 200 mcg IV, Midazolam 2 mg IV Complications:         No immediate complications Estimated Blood Loss:  Estimated blood loss: none. Procedure:      Pre-Anesthesia Assessment:      - A History and Physical has been performed. The patient's medications,       allergies and sensitivities have been reviewed.      - The risks and benefits of the procedure and the sedation options and       risks were discussed with the patient. All questions were answered and       informed consent was obtained.      - Mental Status Examination: alert and oriented. Airway Examination:       normal oropharyngeal airway and Mallampati Class I (tonsillar pillars       visualized). Respiratory Examination: clear to auscultation. CV       Examination: RRR, no murmurs, no S3 or S4.      After obtaining informed consent, the bronchoscope was passed under       direct vision. Throughout the procedure, the patient's blood pressure,       pulse, and oxygen saturations were monitored continuously. the [id number]       ([id number]) Olympus bronoscope was introduced through the mouth and       advanced to the tracheobronchial tree. The procedure was accomplished       without difficulty. The patient tolerated the procedure. The total        duration of the procedure was 21 minutes. Findings:      The oropharynx appears normal. The larynx appears normal. The vocal       cords appear normal. The subglottic space is normal. The trachea is of       normal caliber. The carina is sharp. The tracheobronchial tree was       examined to at least the first subsegmental level. Bronchial mucosa and       anatomy are normal; Dark clot obstruction posterior apical segement of       LUL. Clot was suctioned and cleared. Bronchoalveolar lavage was       performed in the LUL apical posterior segments (B1 & B2) of the lung and       sent  for cell count, bacterial culture, viral smears & culture, and       fungal & AFB analysis and cytology. 120 mL of fluid were instilled. 10       mL were returned. The return was turbid. There were no mucoid plugs in       the return fluid. Mucosa was noted to be friable after BAL with no       evidence of active bleeding Impression:      - Atelectasis of the left upper lobe      - The airway examination was normal.      - Bronchoalveolar lavage was performed. Moderate Sedation:      Moderate (conscious) sedation was personally administered by the       pulmonologist. The following parameters were monitored: oxygen       saturation, heart rate, blood pressure, and response to care. Total       physician intraservice time was 21 minutes. Recommendation:      - Await BAL results. Procedure Code(s):      --- Professional ---      [phone], Bronchoscopy, rigid or flexible, including fluoroscopic guidance,       when performed; with bronchial alveolar lavage Diagnosis Code(s):      --- Professional ---      J98.11, Atelectasis CPT copyright 2019 [employer]. All rights reserved. The codes documented in this report are preliminary and upon coder review may  be revised to meet current compliance requirements. Rodman Pickle, MD 05/01/2021 3:42:19 PM This report has been signed  electronically. Number of Addenda: 1 Scope In: Scope Out: Addendum Number: 1   Addendum Date: 05/02/2021 5:06:33 PM      Procedure: Therapeutic aspiration performed of LUL apical segment. Blood       clot was removed with suctioning with clearance of airway and       visualization of subsegement of LUL apical segment. BAL was also       performed at the same site. Rodman Pickle, MD 05/02/2021 5:08:07 PM This report has been signed electronically.

## 2021-05-02 ENCOUNTER — Encounter (HOSPITAL_COMMUNITY): Payer: Self-pay | Admitting: Pulmonary Disease

## 2021-05-03 LAB — CYTOLOGY - NON PAP

## 2021-05-05 LAB — ACID FAST SMEAR (AFB, MYCOBACTERIA): Acid Fast Smear: NEGATIVE

## 2021-05-16 ENCOUNTER — Ambulatory Visit (INDEPENDENT_AMBULATORY_CARE_PROVIDER_SITE_OTHER): Payer: Medicare Other | Admitting: Pulmonary Disease

## 2021-05-16 ENCOUNTER — Other Ambulatory Visit: Payer: Self-pay

## 2021-05-16 ENCOUNTER — Encounter: Payer: Self-pay | Admitting: Pulmonary Disease

## 2021-05-16 VITALS — BP 104/60 | HR 72 | Temp 97.6°F | Ht 63.0 in | Wt 144.6 lb

## 2021-05-16 DIAGNOSIS — J432 Centrilobular emphysema: Secondary | ICD-10-CM

## 2021-05-16 DIAGNOSIS — T17908A Unspecified foreign body in respiratory tract, part unspecified causing other injury, initial encounter: Secondary | ICD-10-CM | POA: Diagnosis not present

## 2021-05-16 MED ORDER — AZITHROMYCIN 250 MG PO TABS: 250.0000 mg | ORAL_TABLET | ORAL | 3 refills | Status: DC

## 2021-05-16 MED ORDER — ALBUTEROL SULFATE HFA 108 (90 BASE) MCG/ACT IN AERS: 2.0000 | INHALATION_SPRAY | Freq: Four times a day (QID) | RESPIRATORY_TRACT | 6 refills | Status: DC | PRN

## 2021-05-16 MED ORDER — ANORO ELLIPTA 62.5-25 MCG/ACT IN AEPB: 1.0000 | INHALATION_SPRAY | Freq: Every day | RESPIRATORY_TRACT | 11 refills | Status: DC

## 2021-05-16 NOTE — Patient Instructions (Addendum)
Emphysema/COPD  GOLD Class C  --CONTINUE Flovent 110 mcg TWO puff TWICE a day --CONTINUE Anoro Ellipta 62.5-25 mcg ONE puff ONCE a day (90 day prescription preferred) --REFILL Azithromycin. EKG today --CONTINUE Duoneb as needed for shortness of breath or wheezing  Concern for aspiration --Call our office if you wish to pursue swallow evaluation/therapy --Provided aspiration precautions handout  Follow-up with me in 5 months

## 2021-05-16 NOTE — Progress Notes (Signed)
Synopsis: Referred in 01/2018 for COPD  Subjective:   PATIENT ID: Elaine [name], Elaine [name]   HPI  Chief Complaint  Patient presents with   Follow-up   Elaine [name] is a 76 year-old female with hx right breast cancer s/p mastectomy who presents for COPD follow-up  2019 - Established care with [facility] in November. On Anoro and chronic macrolide 2020 - Outpatient exacerbation Dec 2021 - Urgent care for COPD exacerbation in July 2022 - Hospitalized for COPD exacerbation in June  01/24/21 Since our last visit, she fell/passed out at the end of August. She states she was wearing her oxygen. Since this fall she has some coughing during the day and night. Has been using her nebulizer up to twice a day since the last month when she was blowing her leaves. Still smoking. She is compliant with her Anoro, azithromycin and oxygen.  05/16/21 Since our last visit she has had recurrent hemoptysis and treated with antibiotics in the ED for presumed pneumonia. She underwent bronchoscopy on 05/01/21 which demonstrated old blood clot occluding subsegment of left upper lobe which was aspirated out. Now she reports mid chest tightness or throat after drinking or eating cough. Producing clear phlegm. No hemoptysis. Reports some nasal drainage which cause her cough as well. Laughing will make her cough. She has dentures but denies problems swallowing.    Social: 50 pack year history. Quit smoking 01/2018. Currently smoking  Past Medical History:  Diagnosis Date   Breast cancer ([place]) 1995   left    Cataract    Complication of anesthesia    Emphysema of lung ([place])    Family history of breast cancer    Hiatal hernia 1992   Hypertension    Hyperthyroidism    Osteoporosis    PONV (postoperative nausea and vomiting)       Social History   Occupational History   Not on file  Tobacco Use   Smoking status: Every Day    Packs/day: 1.00    Years: 50.00     Pack years: 50.00    Types: Cigarettes   Smokeless tobacco: Never   Tobacco comments:    2-5 cigarettes per day-- [facility] 05/16/2021  Vaping Use   Vaping Use: Never used  Substance and Sexual Activity   Alcohol use: Yes    Comment: Martini daily   Drug use: Never   Sexual activity: Not on file    Allergies  Allergen Reactions   Bee Venom Other (See Comments)    Unknown   Codeine Nausea Only   Macrobid [Nitrofurantoin Macrocrystal] Other (See Comments)    Unknown   Morphine And Related Other (See Comments)    Unknown   Oxycodone Nausea Only   Penicillins Other (See Comments)    Unknown Has patient had a PCN reaction causing immediate rash, facial/tongue/throat swelling, SOB or lightheadedness with hypotension: Unknown Has patient had a PCN reaction causing severe rash involving mucus membranes or skin necrosis: Unknown Has patient had a PCN reaction that required hospitalization: Unknown Has patient had a PCN reaction occurring within the last 10 years: No If all of the above answers are "NO", then may proceed with Cephalosporin use.    Tyloxapol Nausea Only and Other (See Comments)    Extreme Drugged feeling, Doesn't affect pain     Outpatient Medications Prior to Visit  Medication Sig Dispense Refill   albuterol (VENTOLIN HFA) 108 (90 Base) MCG/ACT inhaler Inhale 2 puffs into the  lungs every 6 (six) hours as needed for wheezing or shortness of breath. 8 g 3   anastrozole (ARIMIDEX) 1 MG tablet Take 1 tablet (1 mg total) by mouth daily. 30 tablet 3   ANORO ELLIPTA 62.5-25 MCG/INH AEPB INHALE 1 PUFF BY MOUTH DAILY (Patient taking differently: Inhale 1 puff into the lungs daily.) 180 each 4   aspirin 81 MG EC tablet Take 81 mg by mouth daily. Swallow whole.     Azelastine HCl 137 MCG/SPRAY SOLN Place into both nostrils.     azithromycin (ZITHROMAX) 250 MG tablet Take 1 tablet (250 mg total) by mouth every Monday, Wednesday, and Friday. 39 tablet 0   denosumab (PROLIA) 60 MG/ML  SOSY injection Inject 60 mg into the skin every 6 (six) months.      fluticasone (FLOVENT HFA) 110 MCG/ACT inhaler TAKE 2 PUFFS BY MOUTH TWICE A DAY 3 each 3   ipratropium-albuterol (DUONEB) 0.5-2.5 (3) MG/3ML SOLN Inhale 3 mLs into the lungs See admin instructions. Use 1 vial via nebulizer daily. May use additional vial if needed at bedtime for shortness of breath. 360 mL 3   losartan (COZAAR) 50 MG tablet Take 50 mg by mouth daily.     Multiple Vitamins-Minerals (PRESERVISION AREDS 2 PO) Take 2 tablets by mouth daily.     OXYGEN Inhale 2 L into the lungs continuous.     rosuvastatin (CRESTOR) 5 MG tablet Take 5 mg by mouth daily.     SYNTHROID 75 MCG tablet Take 75 mcg by mouth daily before breakfast. Rotation between 23mcg one day and 19mcg the next  1   VENTOLIN HFA 108 (90 Base) MCG/ACT inhaler INHALE 2 PUFFS INTO THE LUNGS EVERY 4 (FOUR) HOURS AS NEEDED FOR WHEEZING OR SHORTNESS OF BREATH. 18 each 5   No facility-administered medications prior to visit.    Review of Systems  Constitutional:  Negative for chills, diaphoresis, fever, malaise/fatigue and weight loss.  HENT:  Negative for congestion.   Respiratory:  Positive for cough. Negative for hemoptysis, sputum production, shortness of breath and wheezing.   Cardiovascular:  Negative for chest pain, palpitations and leg swelling.  Objective:    Vitals:   05/16/21 1359  BP: 104/60  Pulse: 72  Temp: 97.6 F (36.4 C)  TempSrc: Oral  SpO2: 93%  Weight: 144 lb 9.6 oz (65.6 kg)  Height: 5\' 3"  (1.6 m)     Physical Exam: General: Well-appearing, no acute distress HENT: [place], AT Eyes: EOMI, no scleral icterus Respiratory: Clear to auscultation bilaterally.  No crackles, wheezing or rales Cardiovascular: RRR, -M/R/G, no JVD Extremities:-Edema,-tenderness Neuro: AAO x4, CNII-XII grossly intact Psych: Normal mood, normal affect  Data Reviewed  Chest imaging: CT Chest 07/24/17 - Severe emphysema. RML groundglass nodule 45mm CT  Chest 03/09/18 - Severe emphysema. Interval resolution of ground glass nodule CT Chest 05/05/20 - Emphysema present. 4.7 mm noncalcified nodule. CTA 04/17/21 - No PE. Emphysema. LUL patchy infiltrate. 5 mm LLL groundglass nodule which is new  PFT:  09/27/18 FVC 3.13 (120%) FEV1 1.84 (99%) Ratio 71  TLC 92% DLCO 42% Interpretation: Normal spirometry  Labs: Absolute Eosinophils  11/06/2017: 300 04/28/18: 200 Assessment & Plan:  76 year old female active smoker with history of right breast cancer and emphysema who presents for follow-up. Recently had pneumonia c/b hemoptysis and blood clot requiring aspiration via bronchoscopy. No further hemoptysis. She intermittently has symptoms of chest tightness/cough associated with eating. We discussed aspiration precautions. Discussed clinical course and management of COPD including  smoking cessation, bronchodilator regimen and action plan for exacerbation (prefers low-dose prednisone).  Emphysema/COPD  GOLD Class C  --CONTINUE Flovent 110 mcg TWO puff TWICE a day --CONTINUE Anoro Ellipta 62.5-25 mcg ONE puff ONCE a day (90 day prescription preferred) --REFILL Azithromycin. EKG reviewed with NSR, no ST changes or TWI. Normal QTc --CONTINUE Duoneb as needed for shortness of breath or wheezing  Tobacco abuse Patient is an active smoker. We discussed smoking cessation for 3 minutes. We discussed triggers and stressors and ways to deal with them. We discussed barriers to continued smoking and benefits of smoking cessation. Provided patient with information cessation techniques and interventions including [place] quitline.  Concern for aspiration --Call our office if you wish to pursue swallow evaluation/therapy --Provided aspiration precautions handout  Health Maintenance Immunization History  Administered Date(s) Administered   Influenza Inj Mdck Quad Pf 01/23/2018   Influenza, High Dose Seasonal PF 01/18/2021   Influenza-Unspecified 01/13/2015, 02/08/2016,  01/16/2017, 01/28/2018   Janssen (J&J) SARS-COV-2 Vaccination 07/01/2019, 02/12/2020   Pneumococcal Polysaccharide-23 08/10/2016   Pneumococcal-Unspecified 08/02/2014   Tdap 08/02/2014   CT Lung Screen - Enrolled. Due 05/2021  Orders Placed This Encounter  Procedures   EKG 12-Lead    Meds ordered this encounter  Medications   albuterol (VENTOLIN HFA) 108 (90 Base) MCG/ACT inhaler    Sig: Inhale 2 puffs into the lungs every 6 (six) hours as needed for wheezing or shortness of breath.    Dispense:  8 g    Refill:  6   umeclidinium-vilanterol (ANORO ELLIPTA) 62.5-25 MCG/ACT AEPB    Sig: Inhale 1 puff into the lungs daily.    Dispense:  60 each    Refill:  11   azithromycin (ZITHROMAX) 250 MG tablet    Sig: Take 1 tablet (250 mg total) by mouth every Monday, Wednesday, and Friday.    Dispense:  39 tablet    Refill:  3    Return in about 5 months (around 10/13/2021).  I have spent a total time of 36-minutes on the day of the appointment reviewing prior documentation, coordinating care and discussing medical diagnosis and plan with the patient/family. Past medical history, allergies, medications were reviewed. Pertinent imaging, labs and tests included in this note have been reviewed and interpreted independently by me.  Leroi Haque Rodman Pickle, MD [facility] 05/16/2021 1:58 PM

## 2021-05-18 ENCOUNTER — Encounter: Payer: Self-pay | Admitting: Pulmonary Disease

## 2021-05-18 MED ORDER — ALBUTEROL SULFATE HFA 108 (90 BASE) MCG/ACT IN AERS: 2.0000 | INHALATION_SPRAY | Freq: Four times a day (QID) | RESPIRATORY_TRACT | 2 refills | Status: DC | PRN

## 2021-05-23 ENCOUNTER — Encounter: Payer: Self-pay | Admitting: Pulmonary Disease

## 2021-05-23 NOTE — Telephone Encounter (Signed)
FYI:  I started a regiment of prednisone yesterday morning (4 for 2 days, 3 for 2 days, etc.).  Reason:  Started the same symptoms that I had in June prior to being admitted to the hospital.  Currently, it is in the morning when I first wake up (using oxygen at night). Oxygen level will drop to 86/88 and I feel like chest is closing up (middle chest bone).  Phlegm is clear .  This lasted all day yesterday, but today only lasted about 3 hours. Elaine [name] dob 2045-07-01  I called and spoke with the pt  She sates that since starting the pred she feels like her breathing is improving  She is not coughing much- clear  She states she does not feel she needs to be seen since pred is helping  She wanted to just let Dr Loanne Drilling be aware  I advised that if she does not continue to improve to be sure and call for appt before the weekend comes

## 2021-05-23 NOTE — Telephone Encounter (Signed)
Agree with steroid taper. If symptoms worsen, please off patient appointment with available physician or NP for acute visit.

## 2021-05-25 ENCOUNTER — Telehealth: Payer: Self-pay | Admitting: Pulmonary Disease

## 2021-05-25 NOTE — Telephone Encounter (Signed)
Please advise if ok to change to a mychart?

## 2021-05-26 NOTE — Telephone Encounter (Signed)
Ok to change to [employer] visit

## 2021-05-28 NOTE — Telephone Encounter (Signed)
Per pharmacy she has filled a prescription. Nothing further needed.

## 2021-05-28 NOTE — Telephone Encounter (Signed)
Called and spoke with patient. She is aware that JE is ok with her appt being changed to a VV. Appt has been changed.   Nothing further needed at time of call.

## 2021-06-04 LAB — FUNGUS CULTURE RESULT

## 2021-06-04 LAB — FUNGAL ORGANISM REFLEX

## 2021-06-04 LAB — FUNGUS CULTURE WITH STAIN

## 2021-06-05 ENCOUNTER — Encounter: Payer: Self-pay | Admitting: Pulmonary Disease

## 2021-06-05 ENCOUNTER — Telehealth (INDEPENDENT_AMBULATORY_CARE_PROVIDER_SITE_OTHER): Payer: Medicare Other | Admitting: Pulmonary Disease

## 2021-06-05 ENCOUNTER — Other Ambulatory Visit: Payer: Self-pay

## 2021-06-05 DIAGNOSIS — J441 Chronic obstructive pulmonary disease with (acute) exacerbation: Secondary | ICD-10-CM

## 2021-06-05 MED ORDER — PREDNISONE 10 MG PO TABS: ORAL_TABLET | ORAL | 0 refills | Status: AC

## 2021-06-05 NOTE — Progress Notes (Signed)
Virtual Visit via Video Note  I connected with Elaine [name] on 47/65/46 at  1:45 PM EST by a video enabled telemedicine application and verified that I am speaking with the correct person using two identifiers.  Location: Patient: Home Provider: [facility] office   I discussed the limitations of evaluation and management by telemedicine and the availability of in person appointments. The patient expressed understanding and agreed to proceed.   I discussed the assessment and treatment plan with the patient. The patient was provided an opportunity to ask questions and all were answered. The patient agreed with the plan and demonstrated an understanding of the instructions.   The patient was advised to call back or seek an in-person evaluation if the symptoms worsen or if the condition fails to improve as anticipated.  I provided 25 minutes of non-face-to-face time during this encounter.   Elaine Donahoo Rodman Pickle, MD  Subjective:   PATIENT ID: Elaine [name] GENDER: female DOB: 01-Jun-1945, MRN: [id number]   HPI  Chief Complaint  Patient presents with   Follow-up   [name] is a 76 year-old female with hx right breast cancer s/p mastectomy who presents for COPD follow-up  2019 - Established care with [facility] in November. On Anoro and chronic macrolide 2020 - Outpatient exacerbation Dec 2021 - Urgent care for COPD exacerbation in July 2022 - Hospitalized for COPD exacerbation in June 2023 - PNA followed by recurrent hemoptysis > bronchoscopy 05/01/21 demonstrated old blood clot occluding subsegment of left upper lobe which was aspirated  06/05/21 Since her last visit 2 weeks ago patient reports productive cough with clear sputum and nadir O2 86 to 88%.  No hemoptysis. She started her prednisone taper which has improved her breathing. Wheezing has resolved. Currently SpO2 96%. Now able to ambulate with dyspnea. She has a new Apple Watch and noticed that it has readings of  atrial fibrillation and her heart rate is high. This has resolved with her COPD exacerbation.  Social: 50 pack year history. Quit smoking 01/2018. Currently smoking  Past Medical History:  Diagnosis Date   Breast cancer ([place]) 1995   left    Cataract    Complication of anesthesia    Emphysema of lung ([place])    Family history of breast cancer    Hiatal hernia 1992   Hypertension    Hyperthyroidism    Osteoporosis    PONV (postoperative nausea and vomiting)       Social History   Occupational History   Not on file  Tobacco Use   Smoking status: Every Day    Packs/day: 1.00    Years: 50.00    Pack years: 50.00    Types: Cigarettes   Smokeless tobacco: Never   Tobacco comments:    2-5 cigarettes per day-- [facility] 05/16/2021  Vaping Use   Vaping Use: Never used  Substance and Sexual Activity   Alcohol use: Yes    Comment: Martini daily   Drug use: Never   Sexual activity: Not on file    Allergies  Allergen Reactions   Bee Venom Other (See Comments)    Unknown   Codeine Nausea Only   Macrobid [Nitrofurantoin Macrocrystal] Other (See Comments)    Unknown   Morphine And Related Other (See Comments)    Unknown   Oxycodone Nausea Only   Penicillins Other (See Comments)    Unknown Has patient had a PCN reaction causing immediate rash, facial/tongue/throat swelling, SOB or lightheadedness with hypotension: Unknown Has  patient had a PCN reaction causing severe rash involving mucus membranes or skin necrosis: Unknown Has patient had a PCN reaction that required hospitalization: Unknown Has patient had a PCN reaction occurring within the last 10 years: No If all of the above answers are "NO", then may proceed with Cephalosporin use.    Tyloxapol Nausea Only and Other (See Comments)    Extreme Drugged feeling, Doesn't affect pain     Outpatient Medications Prior to Visit  Medication Sig Dispense Refill   albuterol (VENTOLIN HFA) 108 (90 Base) MCG/ACT inhaler Inhale 2 puffs  into the lungs every 6 (six) hours as needed for wheezing or shortness of breath. 24 g 2   anastrozole (ARIMIDEX) 1 MG tablet Take 1 tablet (1 mg total) by mouth daily. 30 tablet 3   ANORO ELLIPTA 62.5-25 MCG/INH AEPB INHALE 1 PUFF BY MOUTH DAILY (Patient taking differently: Inhale 1 puff into the lungs daily.) 180 each 4   aspirin 81 MG EC tablet Take 81 mg by mouth daily. Swallow whole.     Azelastine HCl 137 MCG/SPRAY SOLN Place into both nostrils.     azithromycin (ZITHROMAX) 250 MG tablet Take 1 tablet (250 mg total) by mouth every Monday, Wednesday, and Friday. 39 tablet 3   denosumab (PROLIA) 60 MG/ML SOSY injection Inject 60 mg into the skin every 6 (six) months.      fluticasone (FLOVENT HFA) 110 MCG/ACT inhaler TAKE 2 PUFFS BY MOUTH TWICE A DAY 3 each 3   ipratropium-albuterol (DUONEB) 0.5-2.5 (3) MG/3ML SOLN Inhale 3 mLs into the lungs See admin instructions. Use 1 vial via nebulizer daily. May use additional vial if needed at bedtime for shortness of breath. 360 mL 3   losartan (COZAAR) 50 MG tablet Take 50 mg by mouth daily.     Multiple Vitamins-Minerals (PRESERVISION AREDS 2 PO) Take 2 tablets by mouth daily.     OXYGEN Inhale 2 L into the lungs continuous.     rosuvastatin (CRESTOR) 5 MG tablet Take 5 mg by mouth daily.     SYNTHROID 75 MCG tablet Take 75 mcg by mouth daily before breakfast. Rotation between 138mg one day and 748m the next  1   umeclidinium-vilanterol (ANORO ELLIPTA) 62.5-25 MCG/ACT AEPB Inhale 1 puff into the lungs daily. 60 each 11   VENTOLIN HFA 108 (90 Base) MCG/ACT inhaler INHALE 2 PUFFS INTO THE LUNGS EVERY 4 (FOUR) HOURS AS NEEDED FOR WHEEZING OR SHORTNESS OF BREATH. 18 each 5   No facility-administered medications prior to visit.    Review of Systems  Constitutional:  Negative for chills, diaphoresis, fever, malaise/fatigue and weight loss.  HENT:  Negative for congestion.   Respiratory:  Negative for cough, hemoptysis, sputum production, shortness of  breath and wheezing.   Cardiovascular:  Negative for chest pain, palpitations and leg swelling.  Objective:    There were no vitals filed for this visit.   SpO2 96%  Physical Exam: General: Well-appearing, no acute distress HENT: [place], AT Eyes: EOMI, no scleral icterus Respiratory: No respiratory distress Neuro: AAO x4, CNII-XII grossly intact Psych: Normal mood, normal affect   Data Reviewed  Chest imaging: CT Chest 07/24/17 - Severe emphysema. RML groundglass nodule 38m25mT Chest 03/09/18 - Severe emphysema. Interval resolution of ground glass nodule CT Chest 05/05/20 - Emphysema present. 4.7 mm noncalcified nodule. CTA 04/17/21 - No PE. Emphysema. LUL patchy infiltrate. 5 mm LLL groundglass nodule which is new  PFT:  09/27/18 FVC 3.13 (120%) FEV1 1.84 (99%) Ratio 71  TLC 92% DLCO 42% Interpretation: Normal spirometry  Labs: Absolute Eosinophils  11/06/2017: 300 04/28/18: 200 04/25/21: 300  CBC    Component Value Date/Time   WBC 9.3 04/25/2021 1610   RBC 4.50 04/25/2021 1610   HGB 14.5 04/25/2021 1610   HGB 16.7 (H) 11/06/2017 1448   HCT 42.3 04/25/2021 1610   PLT 257.0 04/25/2021 1610   PLT 210 11/06/2017 1448   MCV 94.1 04/25/2021 1610   MCH 33.0 04/17/2021 1417   MCHC 34.4 04/25/2021 1610   RDW 13.9 04/25/2021 1610   LYMPHSABS 2.6 04/25/2021 1610   MONOABS 0.8 04/25/2021 1610   EOSABS 0.3 04/25/2021 1610   BASOSABS 0.1 04/25/2021 1610    Assessment & Plan:  76 year old female active smoker with history of right breast cancer and emphysema who presents for follow-up for COPD exacerbation. Discussed clinical course and management of COPD/asthma including bronchodilator regimen and action plan for exacerbation (prefers low dose prednisone).  Emphysema/COPD  GOLD Class C  --CONTINUE Flovent 110 mcg TWO puff TWICE a day --CONTINUE Anoro Ellipta 62.5-25 mcg ONE puff ONCE a day (90 day prescription preferred) --CONTINUE Azithromycin. EKG reviewed with NSR, no ST  changes or TWI. Normal QTc --CONTINUE Duoneb as needed for shortness of breath or wheezing --Refilled prednisone taper for future use. She will call office if this is used  Tobacco abuse Patient is an active smoker.  Concern for aspiration --Call our office if you wish to pursue swallow evaluation/therapy --Provided aspiration precautions handout  Palpitations --In-office EKGs in EMR show NSR or mild sinus bradycardia --Reassured patient that atrial fib on Apple Watch is not diagnostic --Patient requests cardiac evaluation given frequency of events  Health Maintenance Immunization History  Administered Date(s) Administered   Influenza Inj Mdck Quad Pf 01/23/2018   Influenza, High Dose Seasonal PF 01/18/2021   Influenza-Unspecified 01/13/2015, 02/08/2016, 01/16/2017, 01/28/2018   Janssen (J&J) SARS-COV-2 Vaccination 07/01/2019, 02/12/2020   Pneumococcal Polysaccharide-23 08/10/2016   Pneumococcal-Unspecified 08/02/2014   Tdap 08/02/2014   CT Lung Screen - Enrolled. Due 05/2021  No orders of the defined types were placed in this encounter.   Meds ordered this encounter  Medications   predniSONE (DELTASONE) 10 MG tablet    Sig: Take 4 tablets (40 mg total) by mouth daily with breakfast for 2 days, THEN 3 tablets (30 mg total) daily with breakfast for 2 days, THEN 2 tablets (20 mg total) daily with breakfast for 2 days, THEN 1 tablet (10 mg total) daily with breakfast for 2 days.    Dispense:  20 tablet    Refill:  0    Return in about 4 months (around 10/05/2021).  I have spent a total time of 25-minutes on the day of the appointment reviewing prior documentation, coordinating care and discussing medical diagnosis and plan with the patient/family. Past medical history, allergies, medications were reviewed. Pertinent imaging, labs and tests included in this note have been reviewed and interpreted independently by me.  Doyne Micke Rodman Pickle, MD [facility] 06/05/2021 7:27 AM

## 2021-06-05 NOTE — Patient Instructions (Signed)
Emphysema/COPD  ?GOLD Class C  ?--CONTINUE Flovent 110 mcg TWO puff TWICE a day ?--CONTINUE Anoro Ellipta 62.5-25 mcg ONE puff ONCE a day (90 day prescription preferred) ?--CONTINUE Azithromycin. EKG reviewed with NSR, no ST changes or TWI. Normal QTc ?--CONTINUE Duoneb as needed for shortness of breath or wheezing ?--Refilled prednisone taper for future use. She will call office if this is used ? ?Follow-up with me in 4 month ?

## 2021-06-16 LAB — ACID FAST CULTURE WITH REFLEXED SENSITIVITIES (MYCOBACTERIA): Acid Fast Culture: NEGATIVE

## 2021-06-17 ENCOUNTER — Encounter: Payer: Self-pay | Admitting: Pulmonary Disease

## 2021-06-18 ENCOUNTER — Other Ambulatory Visit: Payer: Self-pay | Admitting: Pulmonary Disease

## 2021-06-18 MED ORDER — IPRATROPIUM-ALBUTEROL 0.5-2.5 (3) MG/3ML IN SOLN: RESPIRATORY_TRACT | 11 refills | Status: DC

## 2021-06-19 NOTE — Telephone Encounter (Signed)
This medicine is not on her regular med list (Atrovent neb). Did the patient or the pharmacy request this? ?

## 2021-06-20 NOTE — Telephone Encounter (Signed)
It looks like Duoneb was sent in on 06/18/21. The pharmacy sent this request over since they have the ipratropium in stock but not the combo. A lot of the pharmacies are running low on albuterol and/or Duonebs, especially the [facility].  ?

## 2021-06-20 NOTE — Telephone Encounter (Signed)
Change to atrovent neb q6h x 1 month supply. No refill. ?

## 2021-07-21 ENCOUNTER — Encounter: Payer: Self-pay | Admitting: Pulmonary Disease

## 2021-07-22 ENCOUNTER — Other Ambulatory Visit: Payer: Self-pay | Admitting: Pulmonary Disease

## 2021-07-23 NOTE — Telephone Encounter (Signed)
Dr. Ellison, please see  mychart message sent by pt and advise. 

## 2021-07-27 NOTE — Telephone Encounter (Signed)
Pt states she is feeling better, but is still requesting another round of predisone ?

## 2021-07-31 ENCOUNTER — Encounter: Payer: Self-pay | Admitting: Pulmonary Disease

## 2021-07-31 NOTE — Telephone Encounter (Signed)
I called and spoke with the pt  ?I scheduled her for video visit with JE on 08/02/21 at 10:45 am to discuss refilling her pred ?Nothing further needed ?

## 2021-08-02 ENCOUNTER — Encounter: Payer: Self-pay | Admitting: Pulmonary Disease

## 2021-08-02 ENCOUNTER — Telehealth (INDEPENDENT_AMBULATORY_CARE_PROVIDER_SITE_OTHER): Payer: Medicare Other | Admitting: Pulmonary Disease

## 2021-08-02 DIAGNOSIS — J441 Chronic obstructive pulmonary disease with (acute) exacerbation: Secondary | ICD-10-CM

## 2021-08-02 MED ORDER — DOXYCYCLINE HYCLATE 100 MG PO TABS: 100.0000 mg | ORAL_TABLET | Freq: Two times a day (BID) | ORAL | 0 refills | Status: DC

## 2021-08-02 MED ORDER — PREDNISONE 10 MG PO TABS: ORAL_TABLET | ORAL | 1 refills | Status: AC

## 2021-08-02 NOTE — Patient Instructions (Signed)
COPD exacerbation ?--Rx doxycycline. Hold chronic azithromycin until this you have completed your doxycycline. ?--Prednisone taper if symptoms don't improve on antibiotics ?

## 2021-08-02 NOTE — Progress Notes (Signed)
? ?Virtual Visit via Video Note ? ?I connected with Elaine [name] on 69/48/54 at 10:45 AM EDT by a video enabled telemedicine application and verified that I am speaking with the correct person using two identifiers. ? ?Location: ?Patient: Home ?Provider: [facility] ?  ?I discussed the limitations of evaluation and management by telemedicine and the availability of in person appointments. The patient expressed understanding and agreed to proceed. ? ?I discussed the assessment and treatment plan with the patient. The patient was provided an opportunity to ask questions and all were answered. The patient agreed with the plan and demonstrated an understanding of the instructions. ?  ?The patient was advised to call back or seek an in-person evaluation if the symptoms worsen or if the condition fails to improve as anticipated. ? ?I provided 30 minutes of non-face-to-face time during this encounter. ? ? ?Romona Murdy Rodman Pickle, MD ? ? ?Subjective:  ? ?PATIENT ID: Elaine [name] GENDER: female DOB: 09-04-1945, MRN: [id number] ? ? ?HPI ? ?Chief Complaint  ?Patient presents with  ? Follow-up  ?  Breathing improving. Some phlegm but better.  ? ?Elaine [name] is a 76 year-old female with hx right breast cancer s/p mastectomy who presents for COPD follow-up ? ?2019 - Established care with  in November. On Anoro and chronic macrolide ?2020 - Outpatient exacerbation Dec ?2021 - Urgent care for COPD exacerbation in July ?2022 - Hospitalized for COPD exacerbation in June ?2023 - PNA followed by recurrent hemoptysis > bronchoscopy 05/01/21 demonstrated old blood clot occluding subsegment of left upper lobe which was aspirated. Outpatient COPD exacerbation in Feb and April. ? ?08/02/21 ?She reports recent exacerbation in April with improvement on prednisone. Still some mild productive cough and shortness of breath on exertion.Denies wheezing, fevers, chills. Her oxygen is 92% on room air. Usually wears O2 at night unless  she is having an exacerbation.  ? ?She reports when she has difficulty breathing, her apple watch shows atrial fibrillation which improves to sinus rhythm at rest. Denies chest pain, dizziness. This has been occurring since Feb/March and wishes to see a Cardiologist at [facility]. ? ?Social: 50 pack year history. Quit smoking 01/2018. Currently smoking ? ?Past Medical History:  ?Diagnosis Date  ? Breast cancer ([place]) 1995  ? left   ? Cataract   ? Complication of anesthesia   ? Emphysema of lung ([place])   ? Family history of breast cancer   ? Hiatal hernia 1992  ? Hypertension   ? Hyperthyroidism   ? Osteoporosis   ? PONV (postoperative nausea and vomiting)   ?   ? ?Social History  ? ?Occupational History  ? Not on file  ?Tobacco Use  ? Smoking status: Every Day  ?  Packs/day: 1.00  ?  Years: 50.00  ?  Pack years: 50.00  ?  Types: Cigarettes  ? Smokeless tobacco: Never  ? Tobacco comments:  ?  2-5 cigarettes per day-- [facility] 05/16/2021  ?Vaping Use  ? Vaping Use: Never used  ?Substance and Sexual Activity  ? Alcohol use: Yes  ?  Comment: Carlynn Spry daily  ? Drug use: Never  ? Sexual activity: Not on file  ? ? ?Allergies  ?Allergen Reactions  ? Bee Venom Other (See Comments)  ?  Unknown  ? Codeine Nausea Only  ? Macrobid [Nitrofurantoin Macrocrystal] Other (See Comments)  ?  Unknown  ? Morphine And Related Other (See Comments)  ?  Unknown  ? Oxycodone Nausea Only  ? Penicillins Other (See  Comments)  ?  Unknown ?Has patient had a PCN reaction causing immediate rash, facial/tongue/throat swelling, SOB or lightheadedness with hypotension: Unknown ?Has patient had a PCN reaction causing severe rash involving mucus membranes or skin necrosis: Unknown ?Has patient had a PCN reaction that required hospitalization: Unknown ?Has patient had a PCN reaction occurring within the last 10 years: No ?If all of the above answers are "NO", then may proceed with Cephalosporin use. ?  ? Tyloxapol Nausea Only and Other (See Comments)  ?  Extreme Drugged  feeling, Doesn't affect pain  ?  ? ?Outpatient Medications Prior to Visit  ?Medication Sig Dispense Refill  ? albuterol (VENTOLIN HFA) 108 (90 Base) MCG/ACT inhaler Inhale 2 puffs into the lungs every 6 (six) hours as needed for wheezing or shortness of breath. 24 g 2  ? anastrozole (ARIMIDEX) 1 MG tablet Take 1 tablet (1 mg total) by mouth daily. 30 tablet 3  ? ANORO ELLIPTA 62.5-25 MCG/INH AEPB INHALE 1 PUFF BY MOUTH DAILY (Patient taking differently: Inhale 1 puff into the lungs daily.) 180 each 4  ? aspirin 81 MG EC tablet Take 81 mg by mouth daily. Swallow whole.    ? Azelastine HCl 137 MCG/SPRAY SOLN Place into both nostrils.    ? azithromycin (ZITHROMAX) 250 MG tablet Take 1 tablet (250 mg total) by mouth every Monday, Wednesday, and Friday. 39 tablet 3  ? denosumab (PROLIA) 60 MG/ML SOSY injection Inject 60 mg into the skin every 6 (six) months.     ? fluticasone (FLOVENT HFA) 110 MCG/ACT inhaler TAKE 2 PUFFS BY MOUTH TWICE A DAY 3 each 3  ? ipratropium (ATROVENT) 0.02 % nebulizer solution Take 2.5 mLs (0.5 mg total) by nebulization every 6 (six) hours as needed for wheezing or shortness of breath. 300 mL 0  ? ipratropium-albuterol (DUONEB) 0.5-2.5 (3) MG/3ML SOLN Inhale 3 mLs into the lungs See admin instructions. Use 1 vial via nebulizer daily. May use additional vial if needed at bedtime for shortness of breath. 360 mL 3  ? losartan (COZAAR) 50 MG tablet Take 50 mg by mouth daily.    ? Multiple Vitamins-Minerals (PRESERVISION AREDS 2 PO) Take 2 tablets by mouth daily.    ? OXYGEN Inhale 2 L into the lungs continuous.    ? rosuvastatin (CRESTOR) 5 MG tablet Take 5 mg by mouth daily.    ? SYNTHROID 75 MCG tablet Take 75 mcg by mouth daily before breakfast. Rotation between 74mg one day and 7109m the next  1  ? umeclidinium-vilanterol (ANORO ELLIPTA) 62.5-25 MCG/ACT AEPB Inhale 1 puff into the lungs daily. 60 each 11  ? VENTOLIN HFA 108 (90 Base) MCG/ACT inhaler INHALE 2 PUFFS INTO THE LUNGS EVERY 4 (FOUR)  HOURS AS NEEDED FOR WHEEZING OR SHORTNESS OF BREATH. 18 each 5  ? ?No facility-administered medications prior to visit.  ? ? ?Review of Systems  ?Constitutional:  Negative for chills, diaphoresis, fever, malaise/fatigue and weight loss.  ?HENT:  Negative for congestion.   ?Respiratory:  Positive for cough, sputum production and shortness of breath. Negative for hemoptysis and wheezing.   ?Cardiovascular:  Positive for palpitations. Negative for chest pain and leg swelling.  ?Objective:  ? ? ?There were no vitals filed for this visit. ?  ?SpO2 92% ? ?Physical Exam: ?General: Well-appearing, no acute distress ?HENT: [place], AT ?Eyes: EOMI, no scleral icterus ?Respiratory: No audible wheezing ?Neuro: AAO x4, CNII-XII grossly intact ?Psych: Normal mood, normal affect ? ?Data Reviewed ? ?Chest imaging: ?CT Chest 07/24/17 -  Severe emphysema. RML groundglass nodule 72m ?CT Chest 03/09/18 - Severe emphysema. Interval resolution of ground glass nodule ?CT Chest 05/05/20 - Emphysema present. 4.7 mm noncalcified nodule. ?CTA 04/17/21 - No PE. Emphysema. LUL patchy infiltrate. 5 mm LLL groundglass nodule which is new ? ?PFT:  ?09/27/18 ?FVC 3.13 (120%) FEV1 1.84 (99%) Ratio 71  TLC 92% DLCO 42% ?Interpretation: Normal spirometry ? ?Labs: ?Absolute Eosinophils  ?11/06/2017: 300 ?04/28/18: 200 ?04/25/21: 300 ? ?Assessment & Plan:  ?76year old female active smoker with hx of right breast cancer, osteoporosis and emphysema who presents for follow-up as noted below. Discussed clinical course and management of COPD including bronchodilator regimen and action plan for exacerbation (prefers low dose prednisone). ? ?COPD exacerbation ?--Rx doxycycline. Hold chronic azithromycin until you have completed your doxycycline. ?--Rx prednisone taper if symptoms don't improve on antibiotics ? 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 She will call office if this is used ? ?Tobacco abus

## 2021-08-10 ENCOUNTER — Ambulatory Visit (INDEPENDENT_AMBULATORY_CARE_PROVIDER_SITE_OTHER): Payer: Medicare Other | Admitting: Cardiovascular Disease

## 2021-08-10 ENCOUNTER — Ambulatory Visit (INDEPENDENT_AMBULATORY_CARE_PROVIDER_SITE_OTHER): Payer: Medicare Other

## 2021-08-10 ENCOUNTER — Encounter: Payer: Self-pay | Admitting: Cardiovascular Disease

## 2021-08-10 VITALS — BP 114/58 | HR 70 | Ht 63.0 in | Wt 146.8 lb

## 2021-08-10 DIAGNOSIS — R0602 Shortness of breath: Secondary | ICD-10-CM

## 2021-08-10 DIAGNOSIS — I480 Paroxysmal atrial fibrillation: Secondary | ICD-10-CM | POA: Diagnosis not present

## 2021-08-10 NOTE — Progress Notes (Unsigned)
Enrolled patient for a 14 day Zio XT  monitor to be mailed to patients home  °

## 2021-08-10 NOTE — Progress Notes (Signed)
?Cardiology Office Note:   ?Date:  08/10/2021  ?NAME:  Elaine [name]    ?MRN: [id number] ?DOB:  December 19, 1945  ? ?PCP:  Elaine Cornwall, Elaine [name]  ?Cardiologist:  None  ?Electrophysiologist:  None  ? ?Referring Elaine [name]: Elaine Cornwall, Elaine [name]  ? ?Chief Complaint  ?Patient presents with  ? Palpitations  ?   ?  ? ?History of Present Illness:   ?Elaine [name] is a 76 y.o. female with a hx of COPD, HTN who is being seen today for the evaluation of palpitations at the request of Elaine [name], Elaine Route, Elaine [name]. she has moderate COPD with severe diffusion defect.  On nocturnal oxygen.  She reports for the last several months whenever she gets coughing spells are short of breath she has noticed an irregular heartbeat.  She has an Apple EKG which is demonstrated atrial fibrillation.  EKG today shows sinus rhythm.  Symptoms occur every few weeks.  Also flared by lung disease.  She reports tightness in her chest as well.  She is smoked for 50 years.  Continues to smoke.  She does take aspirin.  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  I have asked her to reach out to her ophthalmologist to determine if she can be anticoagulated.  This is unclear what caused this.  She has never had a heart attack or stroke.  She does have a family history of heart disease.  She is a current everyday smoker.  50 years at least.  No alcohol or drug use is reported.  She presents with her friend.  Still working as an Optometrist.  She is never married without children.  She is not that active.  She reports she is limited by shortness of breath.  Her COPD is fairly advanced. ? ?Last primary care physician: Total cholesterol 125, triglycerides 57, HDL 74, LDL 38, TSH 2.1 ? ?Problem List ?COPD ?-Moderate ?HTN ?Paroxysmal Afib ?-CHADSVASC=4 (age, female, HTN) ? ?Past Medical History: ?Past Medical History:  ?Diagnosis Date  ? Arrhythmia   ? Breast cancer ([place]) 1995  ? left   ? Cataract   ? Complication of anesthesia   ? Emphysema of lung ([place])   ? Family history of  breast cancer   ? Hiatal hernia 1992  ? Hypertension   ? Hyperthyroidism   ? Osteoporosis   ? PONV (postoperative nausea and vomiting)   ? ? ?Past Surgical History: ?Past Surgical History:  ?Procedure Laterality Date  ? BRONCHIAL WASHINGS  05/01/2021  ? Procedure: BRONCHIAL WASHINGS;  Surgeon: Elaine Seeds, Elaine [name];  Location: [facility];  Service: Cardiopulmonary;;  ? MASTECTOMY Left 1995  ? MASTECTOMY W/ SENTINEL NODE BIOPSY Right 12/30/2017  ? MASTECTOMY W/ SENTINEL NODE BIOPSY Right 12/30/2017  ? Procedure: MASTECTOMY WITH SENTINEL LYMPH NODE BIOPSY;  Surgeon: Elaine Bookbinder, Elaine [name];  Location: [facility];  Service: General;  Laterality: Right;  ? TOTAL ABDOMINAL HYSTERECTOMY  06/22/1993  ? VIDEO BRONCHOSCOPY N/A 05/01/2021  ? Procedure: VIDEO BRONCHOSCOPY WITHOUT FLUORO;  Surgeon: Elaine Seeds, Elaine [name];  Location: [facility];  Service: Cardiopulmonary;  Laterality: N/A;  ? ? ?Current Medications: ?Current Meds  ?Medication Sig  ? anastrozole (ARIMIDEX) 1 MG tablet Take 1 tablet (1 mg total) by mouth daily.  ? ANORO ELLIPTA 62.5-25 MCG/INH AEPB INHALE 1 PUFF BY MOUTH DAILY (Patient taking differently: Inhale 1 puff into the lungs daily.)  ? aspirin 81 MG EC tablet Take 81 mg by mouth daily. Swallow whole.  ? Azelastine HCl 137 MCG/SPRAY SOLN Place  into both nostrils.  ? denosumab (PROLIA) 60 MG/ML SOSY injection Inject 60 mg into the skin every 6 (six) months.   ? doxycycline (VIBRA-TABS) 100 MG tablet Take 1 tablet (100 mg total) by mouth 2 (two) times daily.  ? fluticasone (FLOVENT HFA) 110 MCG/ACT inhaler TAKE 2 PUFFS BY MOUTH TWICE A DAY  ? ipratropium (ATROVENT) 0.02 % nebulizer solution Take 2.5 mLs (0.5 mg total) by nebulization every 6 (six) hours as needed for wheezing or shortness of breath.  ? ipratropium-albuterol (DUONEB) 0.5-2.5 (3) MG/3ML SOLN Inhale 3 mLs into the lungs See admin instructions. Use 1 vial via nebulizer daily. May use additional vial if needed at bedtime for shortness of breath.  ?  levothyroxine (SYNTHROID) 50 MCG tablet Take 50 mcg by mouth daily before breakfast.  ? losartan (COZAAR) 50 MG tablet Take 50 mg by mouth daily.  ? OXYGEN Inhale 2 L into the lungs continuous.  ? predniSONE (DELTASONE) 10 MG tablet Take 4 tablets (40 mg total) by mouth daily with breakfast for 2 days, THEN 3 tablets (30 mg total) daily with breakfast for 2 days, THEN 2 tablets (20 mg total) daily with breakfast for 2 days, THEN 1 tablet (10 mg total) daily with breakfast for 2 days.  ? rosuvastatin (CRESTOR) 5 MG tablet Take 5 mg by mouth daily.  ? SYNTHROID 75 MCG tablet Take 75 mcg by mouth daily before breakfast. Rotation between 94mg one day and 73m the next  ? umeclidinium-vilanterol (ANORO ELLIPTA) 62.5-25 MCG/ACT AEPB Inhale 1 puff into the lungs daily.  ?  ? ?Allergies:    ?Bee venom, Codeine, Macrobid [nitrofurantoin macrocrystal], Morphine and related, Oxycodone, Penicillins, and Tyloxapol  ? ?Social History: ?Social History  ? ?Socioeconomic History  ? Marital status: Single  ?  Spouse name: Not on file  ? Number of children: Not on file  ? Years of education: Not on file  ? Highest education level: Not on file  ?Occupational History  ? Occupation: AcOptometrist?Tobacco Use  ? Smoking status: Every Day  ?  Packs/day: 1.00  ?  Years: 50.00  ?  Pack years: 50.00  ?  Types: Cigarettes  ? Smokeless tobacco: Never  ? Tobacco comments:  ?  2-5 cigarettes per day-- [facility] 05/16/2021  ?Vaping Use  ? Vaping Use: Never used  ?Substance and Sexual Activity  ? Alcohol use: Yes  ?  Comment: MaCarlynn Spryaily  ? Drug use: Never  ? Sexual activity: Not on file  ?Other Topics Concern  ? Not on file  ?Social History Narrative  ? Not on file  ? ?Social Determinants of Health  ? ?Financial Resource Strain: Not on file  ?Food Insecurity: Not on file  ?Transportation Needs: Not on file  ?Physical Activity: Not on file  ?Stress: Not on file  ?Social Connections: Not on file  ?  ? ?Family History: ?The patient's family history  includes Aneurysm in her mother; Breast cancer in her cousin and cousin; Breast cancer (age of onset: 3577in her cousin; Diabetes in her sister and sister; Heart attack in her father. ? ?ROS:   ?All other ROS reviewed and negative. Pertinent positives noted in the HPI.    ? ?EKGs/Labs/Other Studies Reviewed:   ?The following studies were personally reviewed by me today: ? ?EKG:  EKG is ordered today.  The ekg ordered today demonstrates normal sinus rhythm heart rate 70, no acute ischemic changes or evidence of infarction, and was personally reviewed by me.  ? ?  Recent Labs: ?04/17/2021: BUN 17; Creatinine, Ser 0.78; Potassium 4.3; Sodium 140 ?04/25/2021: Hemoglobin 14.5; Platelets 257.0  ? ?Recent Lipid Panel ?No results found for: CHOL, TRIG, HDL, CHOLHDL, VLDL, LDLCALC, LDLDIRECT ? ?Physical Exam:   ?VS:  BP (!) 114/58 (BP Location: Left Arm, Patient Position: Sitting, Cuff Size: Normal)   Pulse 70   Ht '5\' 3"'$  (1.6 m)   Wt 146 lb 12.8 oz (66.6 kg)   SpO2 96%   BMI 26.00 kg/m?    ?Wt Readings from Last 3 Encounters:  ?08/10/21 146 lb 12.8 oz (66.6 kg)  ?05/16/21 144 lb 9.6 oz (65.6 kg)  ?04/25/21 145 lb (65.8 kg)  ?  ?General: Well nourished, well developed, in no acute distress ?Head: Atraumatic, normal size  ?Eyes: PEERLA, EOMI  ?Neck: Supple, no JVD ?Endocrine: No thryomegaly ?Cardiac: Normal S1, S2; RRR; no murmurs, rubs, or gallops ?Lungs: Diminished breath sounds bilaterally ?Abd: Soft, nontender, no hepatomegaly  ?Ext: No edema, pulses 2+ ?Musculoskeletal: No deformities, BUE and BLE strength normal and equal ?Skin: Warm and dry, no rashes   ?Neuro: Alert and oriented to person, place, time, and situation, CNII-XII grossly intact, no focal deficits  ?Psych: Normal mood and affect  ? ?ASSESSMENT:   ?KISSIE ZIOLKOWSKI is a 76 y.o. female who presents for the following: ?1. Paroxysmal atrial fibrillation ([place])   ?2. SOB (shortness of breath) on exertion   ? ? ?PLAN:   ?1. Paroxysmal atrial fibrillation  ([place]) ?-She presents with paroxysmal atrial fibrillation episodes.  They appear to be triggered by her lung disease.  Recent TSH is 2.1.  EKG in office demonstrates sinus rhythm with no acute ischemic changes.  Unclear if it

## 2021-08-10 NOTE — Patient Instructions (Addendum)
Medication Instructions:  ?The current medical regimen is effective;  continue present plan and medications. ? ?*If you need a refill on your cardiac medications before your next appointment, please call your pharmacy* ? ? ?Testing/Procedures: ? ?Echocardiogram ([place]) - Your physician has requested that you have an echocardiogram. Echocardiography is a painless test that uses sound waves to create images of your heart. It provides your doctor with information about the size and shape of your heart and how well your heart?s chambers and valves are working. This procedure takes approximately one hour. There are no restrictions for this procedure.  ? ?Your physician has requested that you have a lexiscan myoview ([place]). For further information please visit [email or web address]. Please follow instruction sheet, as given. ? ?ZIO XT- Long Term Monitor Instructions ? ?Your physician has requested you wear a ZIO patch monitor for 14 days.  ?This is a single patch monitor. [employer] supplies one patch monitor per enrollment. Additional ?stickers are not available. Please do not apply patch if you will be having a Nuclear Stress Test,  ?Echocardiogram, Cardiac CT, MRI, or Chest Xray during the period you would be wearing the  ?monitor. The patch cannot be worn during these tests. You cannot remove and re-apply the  ?ZIO XT patch monitor.  ?Your ZIO patch monitor will be mailed 3 day USPS to your address on file. It may take 3-5 days  ?to receive your monitor after you have been enrolled.  ?Once you have received your monitor, please review the enclosed instructions. Your monitor  ?has already been registered assigning a specific monitor serial # to you. ? ?Billing and Patient Assistance Program Information ? ?We have supplied [employer] with any of your insurance information on file for billing purposes. ?[employer] offers a sliding scale Patient Assistance Program for patients that do not have  ?insurance, or whose  insurance does not completely cover the cost of the ZIO monitor.  ?You must apply for the Patient Assistance Program to qualify for this discounted rate.  ?To apply, please call [employer] at [phone], select option 4, select option 2, ask to apply for  ?Patient Assistance Program. Theodore Demark will ask your household income, and how many people  ?are in your household. They will quote your out-of-pocket cost based on that information.  ?[employer] will also be able to set up a 46-month interest-free payment plan if needed. ? ?Applying the monitor ?  ?Shave hair from upper left chest.  ?Hold abrader disc by orange tab. Rub abrader in 40 strokes over the upper left chest as  ?indicated in your monitor instructions.  ?Clean area with 4 enclosed alcohol pads. Let dry.  ?Apply patch as indicated in monitor instructions. Patch will be placed under collarbone on left  ?side of chest with arrow pointing upward.  ?Rub patch adhesive wings for 2 minutes. Remove white label marked "1". Remove the white  ?label marked "2". Rub patch adhesive wings for 2 additional minutes.  ?While looking in a mirror, press and release button in center of patch. A small green light will  ?flash 3-4 times. This will be your only indicator that the monitor has been turned on.  ?Do not shower for the first 24 hours. You may shower after the first 24 hours.  ?Press the button if you feel a symptom. You will hear a small click. Record Date, Time and  ?Symptom in the Patient Logbook.  ?When you are ready to remove the patch, follow instructions on the last 2 pages of Patient  ?  Logbook. Stick patch monitor onto the last page of Patient Logbook.  ?Place Patient Logbook in the blue and white box. Use locking tab on box and tape box closed  ?securely. The blue and white box has prepaid postage on it. Please place it in the mailbox as  ?soon as possible. Your physician should have your test results approximately 7 days after the  ?monitor has been mailed back  to [facility].  ?Call [facility] at [phone] if you have questions regarding  ?your ZIO XT patch monitor. Call them immediately if you see an orange light blinking on your  ?monitor.  ?If your monitor falls off in less than 4 days, contact our Monitor department at [phone].  ?If your monitor becomes loose or falls off after 4 days call [employer] at [phone] for  ?suggestions on securing your monitor  ? ? ?Follow-Up: ?At [facility], you and your health needs are our priority.  As part of our continuing mission to provide you with exceptional heart care, we have created designated Provider Care Teams.  These Care Teams include your primary Cardiologist (physician) and Advanced Practice Providers (APPs -  Physician Assistants and Nurse Practitioners) who all work together to provide you with the care you need, when you need it. ? ?We recommend signing up for the patient portal called "MyChart".  Sign up information is provided on this After Visit Summary.  MyChart is used to connect with patients for Virtual Visits (Telemedicine).  Patients are able to view lab/test results, encounter notes, upcoming appointments, etc.  Non-urgent messages can be sent to your provider as well.   ?To learn more about what you can do with MyChart, go to [email or web address].   ? ?Your next appointment:   ?3 month(s) ? ?The format for your next appointment:   ?In Person ? ?Provider:   ?Eleonore Chiquito, MD  ? ? ? ? ? ? ? ?

## 2021-08-10 NOTE — Addendum Note (Signed)
Addended by: Caprice Beaver T on: 08/10/2021 04:33 PM ? ? Modules accepted: Orders ? ?

## 2021-08-11 ENCOUNTER — Encounter: Payer: Self-pay | Admitting: Cardiovascular Disease

## 2021-08-11 ENCOUNTER — Encounter: Payer: Self-pay | Admitting: Pulmonary Disease

## 2021-08-13 NOTE — Telephone Encounter (Signed)
Routing to Dr. Loanne Drilling as [name]  ? ?[facility] Pool (supporting [name], MD) 2 days ago  ? ?Dr. Loanne Drilling,  the doxycycline did the trick.  I finished the medication yesterday (08/10/21). ?I, also, had an appointment with Dr. Audie Box yesterday. ?I definitely like him. He is going to run some test the next couple of weeks; therefore, be on the lookout for information posted to my chart. ?Thank you again and have a great Mother's Day! ?Elaine [name]  dob 2045/08/15 ? ? ?

## 2021-08-14 ENCOUNTER — Ambulatory Visit: Payer: Medicare Other | Admitting: Cardiovascular Disease

## 2021-08-14 ENCOUNTER — Encounter: Payer: Self-pay | Admitting: Cardiovascular Disease

## 2021-08-14 ENCOUNTER — Other Ambulatory Visit: Payer: Self-pay

## 2021-08-14 DIAGNOSIS — R0602 Shortness of breath: Secondary | ICD-10-CM

## 2021-08-14 NOTE — Addendum Note (Signed)
Addended by: Geralynn Rile on: 08/14/2021 09:16 AM ? ? Modules accepted: Orders ? ?

## 2021-08-15 ENCOUNTER — Encounter (HOSPITAL_COMMUNITY): Payer: Medicare Other

## 2021-08-15 ENCOUNTER — Encounter (HOSPITAL_COMMUNITY): Payer: Self-pay

## 2021-08-15 ENCOUNTER — Other Ambulatory Visit (HOSPITAL_COMMUNITY): Payer: Medicare Other

## 2021-08-15 ENCOUNTER — Ambulatory Visit (HOSPITAL_BASED_OUTPATIENT_CLINIC_OR_DEPARTMENT_OTHER)
Admission: RE | Admit: 2021-08-15 | Discharge: 2021-08-15 | Disposition: A | Discharge status: Home / Self Care | Payer: Medicare Other | Source: Ambulatory Visit | Attending: Cardiovascular Disease | Admitting: Cardiovascular Disease

## 2021-08-15 ENCOUNTER — Ambulatory Visit (HOSPITAL_COMMUNITY)
Admission: RE | Admit: 2021-08-15 | Discharge: 2021-08-15 | Disposition: A | Discharge status: Home / Self Care | Payer: Medicare Other | Source: Ambulatory Visit | Attending: Cardiovascular Disease | Admitting: Cardiovascular Disease

## 2021-08-15 DIAGNOSIS — R0602 Shortness of breath: Secondary | ICD-10-CM | POA: Diagnosis present

## 2021-08-15 LAB — NM MYOCAR MULTI W/SPECT W/WALL MOTION / EF
LV dias vol: 51 mL (ref 46–106)
LV sys vol: 20 mL
Nuc Stress EF: 61 %
Peak HR: 105 {beats}/min
RATE: 0.3
Rest HR: 57 {beats}/min
Rest Nuclear Isotope Dose: 10 mCi
SDS: 3
SRS: 1
SSS: 4
ST Depression (mm): 0 mm
Stress Nuclear Isotope Dose: 30 mCi
TID: 0.52

## 2021-08-15 MED ORDER — APIXABAN 5 MG PO TABS: 5.0000 mg | ORAL_TABLET | Freq: Two times a day (BID) | ORAL | 1 refills | Status: DC

## 2021-08-15 MED ORDER — REGADENOSON 0.4 MG/5ML IV SOLN
INTRAVENOUS | Status: AC
  Filled 2021-08-15: qty 5

## 2021-08-15 MED ORDER — TECHNETIUM TC 99M TETROFOSMIN IV KIT
10.0000 | PACK | Freq: Once | INTRAVENOUS | Status: AC | PRN
  Administered 2021-08-15: 10 via INTRAVENOUS

## 2021-08-15 MED ORDER — APIXABAN 5 MG PO TABS: 5.0000 mg | ORAL_TABLET | Freq: Two times a day (BID) | ORAL | 3 refills | Status: DC

## 2021-08-15 MED ORDER — SODIUM CHLORIDE FLUSH 0.9 % IV SOLN
INTRAVENOUS | Status: AC
  Filled 2021-08-15: qty 10

## 2021-08-15 MED ORDER — TECHNETIUM TC 99M TETROFOSMIN IV KIT
30.0000 | PACK | Freq: Once | INTRAVENOUS | Status: AC | PRN
  Administered 2021-08-15: 30 via INTRAVENOUS

## 2021-08-15 NOTE — Addendum Note (Signed)
Addended by: Patria Mane A on: 08/15/2021 12:58 PM ? ? Modules accepted: Orders ? ?

## 2021-08-20 ENCOUNTER — Ambulatory Visit (HOSPITAL_COMMUNITY)
Admission: RE | Admit: 2021-08-20 | Discharge: 2021-08-20 | Disposition: A | Discharge status: Home / Self Care | Payer: Medicare Other | Source: Ambulatory Visit | Attending: Cardiovascular Disease | Admitting: Cardiovascular Disease

## 2021-08-20 DIAGNOSIS — R0602 Shortness of breath: Secondary | ICD-10-CM | POA: Diagnosis present

## 2021-08-20 DIAGNOSIS — I480 Paroxysmal atrial fibrillation: Secondary | ICD-10-CM | POA: Insufficient documentation

## 2021-08-20 LAB — ECHOCARDIOGRAM COMPLETE
Area-P 1/2: 2.73 cm2
S' Lateral: 1.7 cm

## 2021-08-20 NOTE — Progress Notes (Signed)
*  PRELIMINARY RESULTS* Echocardiogram 2D Echocardiogram has been performed.  Elaine [name] 08/20/2021, 4:00 PM

## 2021-08-21 DIAGNOSIS — I480 Paroxysmal atrial fibrillation: Secondary | ICD-10-CM | POA: Diagnosis not present

## 2021-09-02 ENCOUNTER — Encounter: Payer: Self-pay | Admitting: Pulmonary Disease

## 2021-09-02 ENCOUNTER — Encounter: Payer: Self-pay | Admitting: Cardiovascular Disease

## 2021-09-12 ENCOUNTER — Encounter: Payer: Self-pay | Admitting: Pulmonary Disease

## 2021-09-13 ENCOUNTER — Encounter: Payer: Self-pay | Admitting: Cardiovascular Disease

## 2021-09-13 ENCOUNTER — Encounter: Payer: Self-pay | Admitting: Pulmonary Disease

## 2021-09-13 ENCOUNTER — Other Ambulatory Visit: Payer: Self-pay | Admitting: Pulmonary Disease

## 2021-09-13 MED ORDER — DILTIAZEM HCL ER COATED BEADS 120 MG PO CP24: 120.0000 mg | ORAL_CAPSULE | Freq: Every day | ORAL | 3 refills | Status: DC

## 2021-09-15 ENCOUNTER — Encounter: Payer: Self-pay | Admitting: Cardiovascular Disease

## 2021-09-30 ENCOUNTER — Other Ambulatory Visit: Payer: Self-pay | Admitting: Pulmonary Disease

## 2021-10-15 ENCOUNTER — Encounter: Payer: Self-pay | Admitting: Pulmonary Disease

## 2021-10-15 ENCOUNTER — Ambulatory Visit (INDEPENDENT_AMBULATORY_CARE_PROVIDER_SITE_OTHER): Payer: Medicare Other | Admitting: Pulmonary Disease

## 2021-10-15 VITALS — BP 122/78 | HR 63 | Temp 97.6°F | Ht 63.0 in | Wt 139.8 lb

## 2021-10-15 DIAGNOSIS — J432 Centrilobular emphysema: Secondary | ICD-10-CM | POA: Diagnosis not present

## 2021-10-15 MED ORDER — TRELEGY ELLIPTA 200-62.5-25 MCG/ACT IN AEPB: 1.0000 | INHALATION_SPRAY | Freq: Every day | RESPIRATORY_TRACT | 5 refills | Status: DC

## 2021-10-15 MED ORDER — IPRATROPIUM-ALBUTEROL 0.5-2.5 (3) MG/3ML IN SOLN: 3.0000 mL | RESPIRATORY_TRACT | 3 refills | Status: DC

## 2021-10-15 NOTE — Patient Instructions (Addendum)
GOLD Class C  --Completed prednisone --STOP Flovent  --CONTINUE Anoro --START Trelegy 200 ONE puff ONCE a day --CONTINUE chronic macrolide. Azithromycin M-W-F  Nebulizers - Use twice a day --Please verify which one you have --Duoneb PREFERRED however this can cause palpitations --Atrovent   Tobacco Abuse --Keep going with the patches!  Follow-up with me in 3 months

## 2021-10-15 NOTE — Progress Notes (Signed)
Subjective:   PATIENT ID: Elaine [name] GENDER: female DOB: 09/10/1945, MRN: [id number]   HPI  Chief Complaint  Patient presents with   Follow-up    Recent hospital 7/10 -7/11 r/t unable to breathe     [name] is a 76 year-old female with hx right breast cancer s/p mastectomy who presents for COPD follow-up  2019 - Established care with [facility] in November. On Anoro and chronic macrolide 2020 - Outpatient exacerbation Dec 2021 - Urgent care for COPD exacerbation in July 2022 - Hospitalized for COPD exacerbation in June 2023 - PNA followed by recurrent hemoptysis > bronchoscopy 05/01/21 demonstrated old blood clot occluding subsegment of left upper lobe which was aspirated. Outpatient COPD exacerbation in Feb and April and May. Hospitalized in July.  08/02/21 She reports recent exacerbation in April with improvement on prednisone. Still some mild productive cough and shortness of breath on exertion.Denies wheezing, fevers, chills. Her oxygen is 92% on room air. Usually wears O2 at night unless she is having an exacerbation.   She reports when she has difficulty breathing, her apple watch shows atrial fibrillation which improves to sinus rhythm at rest. Denies chest pain, dizziness. This has been occurring since Feb/March and wishes to see a Cardiologist at [facility].  10/15/21 Since our last visit she was hospitalized from 7/10-7/11 for COPD exacerbation. Discharge summary reviewed from [facility] in [place]. CTA 10/08/21 negative for PE and no acute findings. Discharged on nicotine patch and lozenges and prednisone pack. Since discharge she reports severe nasal congestion and has more drainage. She has continued on a nicotine patch and lozenges, only smoking 1-2 cigarettes a day at most. Has been taking Trelegy and feels this is working well for her. Denies cough, shortness of breath or wheezing at this time.  Social: 50 pack year history. Quit smoking 01/2018. Currently  smoking  Past Medical History:  Diagnosis Date   Arrhythmia    Breast cancer ([place]) 1995   left    Cataract    Complication of anesthesia    Emphysema of lung ([place])    Family history of breast cancer    Hiatal hernia 1992   Hypertension    Hyperthyroidism    Osteoporosis    PONV (postoperative nausea and vomiting)       Social History   Occupational History   Occupation: Optometrist  Tobacco Use   Smoking status: Every Day    Packs/day: 1.00    Years: 50.00    Total pack years: 50.00    Types: Cigarettes   Smokeless tobacco: Never   Tobacco comments:    2 cigarettes per day-- ARJ 10/15/21  Vaping Use   Vaping Use: Never used  Substance and Sexual Activity   Alcohol use: Yes    Comment: Martini daily   Drug use: Never   Sexual activity: Not on file    Allergies  Allergen Reactions   Bee Venom Other (See Comments)    Unknown   Codeine Nausea Only   Macrobid [Nitrofurantoin Macrocrystal] Other (See Comments)    Unknown   Morphine And Related Other (See Comments)    Unknown   Oxycodone Nausea Only   Penicillins Other (See Comments)    Unknown Has patient had a PCN reaction causing immediate rash, facial/tongue/throat swelling, SOB or lightheadedness with hypotension: Unknown Has patient had a PCN reaction causing severe rash involving mucus membranes or skin necrosis: Unknown Has patient had a PCN reaction that required hospitalization: Unknown Has patient  had a PCN reaction occurring within the last 10 years: No If all of the above answers are "NO", then may proceed with Cephalosporin use.    Tyloxapol Nausea Only and Other (See Comments)    Extreme Drugged feeling, Doesn't affect pain     Outpatient Medications Prior to Visit  Medication Sig Dispense Refill   albuterol (VENTOLIN HFA) 108 (90 Base) MCG/ACT inhaler Inhale 2 puffs into the lungs every 6 (six) hours as needed for wheezing or shortness of breath. (Patient not taking: Reported on 08/10/2021) 24 g 2    anastrozole (ARIMIDEX) 1 MG tablet Take 1 tablet (1 mg total) by mouth daily. 30 tablet 3   ANORO ELLIPTA 62.5-25 MCG/INH AEPB INHALE 1 PUFF BY MOUTH DAILY (Patient taking differently: Inhale 1 puff into the lungs daily.) 180 each 4   apixaban (ELIQUIS) 5 MG TABS tablet Take 1 tablet (5 mg total) by mouth 2 (two) times daily. 180 tablet 1   aspirin 81 MG EC tablet Take 81 mg by mouth daily. Swallow whole.     Azelastine HCl 137 MCG/SPRAY SOLN Place into both nostrils.     azithromycin (ZITHROMAX) 250 MG tablet Take 1 tablet (250 mg total) by mouth every Monday, Wednesday, and Friday. (Patient not taking: Reported on 08/10/2021) 39 tablet 3   denosumab (PROLIA) 60 MG/ML SOSY injection Inject 60 mg into the skin every 6 (six) months.      diltiazem (CARDIZEM CD) 120 MG 24 hr capsule Take 1 capsule (120 mg total) by mouth daily. 90 capsule 3   doxycycline (VIBRA-TABS) 100 MG tablet Take 1 tablet (100 mg total) by mouth 2 (two) times daily. 14 tablet 0   fluticasone (FLOVENT HFA) 110 MCG/ACT inhaler TAKE 2 PUFFS BY MOUTH TWICE A DAY 3 each 3   ipratropium (ATROVENT) 0.02 % nebulizer solution Take 2.5 mLs (0.5 mg total) by nebulization every 6 (six) hours as needed for wheezing or shortness of breath. 300 mL 0   ipratropium-albuterol (DUONEB) 0.5-2.5 (3) MG/3ML SOLN Inhale 3 mLs into the lungs See admin instructions. Use 1 vial via nebulizer daily. May use additional vial if needed at bedtime for shortness of breath. 360 mL 3   levothyroxine (SYNTHROID) 50 MCG tablet Take 50 mcg by mouth daily before breakfast.     losartan (COZAAR) 50 MG tablet Take 50 mg by mouth daily.     Multiple Vitamins-Minerals (PRESERVISION AREDS 2 PO) Take 2 tablets by mouth daily. (Patient not taking: Reported on 08/10/2021)     OXYGEN Inhale 2 L into the lungs continuous.     rosuvastatin (CRESTOR) 5 MG tablet Take 5 mg by mouth daily.     SYNTHROID 75 MCG tablet Take 75 mcg by mouth daily before breakfast. Rotation between  56mg one day and 768m the next  1   umeclidinium-vilanterol (ANORO ELLIPTA) 62.5-25 MCG/ACT AEPB Inhale 1 puff into the lungs daily. 60 each 11   VENTOLIN HFA 108 (90 Base) MCG/ACT inhaler INHALE 2 PUFFS INTO THE LUNGS EVERY 4 HOURS AS NEEDED FOR WHEEZING OR SHORTNESS OF BREATH. 54 each 1   No facility-administered medications prior to visit.    Review of Systems  Constitutional:  Negative for chills, diaphoresis, fever, malaise/fatigue and weight loss.  HENT:  Negative for congestion.   Respiratory:  Positive for cough, shortness of breath and wheezing. Negative for hemoptysis and sputum production.   Cardiovascular:  Negative for chest pain, palpitations and leg swelling.   Objective:    Vitals:  10/15/21 1145  BP: 122/78  Pulse: 63  Temp: 97.6 F (36.4 C)  TempSrc: Oral  SpO2: 96%  Weight: 139 lb 12.8 oz (63.4 kg)  Height: '5\' 3"'$  (1.6 m)   SpO2: 96 % O2 Device: None (Room air) SpO2 92%  Physical Exam: General: Well-appearing, no acute distress HENT: [place], AT Eyes: EOMI, no scleral icterus Respiratory: Clear to auscultation bilaterally.  No crackles, wheezing or rales Cardiovascular: RRR, -M/R/G, no JVD Extremities:-Edema,-tenderness Neuro: AAO x4, CNII-XII grossly intact Psych: Normal mood, normal affect  Data Reviewed  Chest imaging: CT Chest 07/24/17 - Severe emphysema. RML groundglass nodule 64m CT Chest 03/09/18 - Severe emphysema. Interval resolution of ground glass nodule CT Chest 05/05/20 - Emphysema present. 4.7 mm noncalcified nodule. CTA 04/17/21 - No PE. Emphysema. LUL patchy infiltrate. 5 mm LLL groundglass nodule which is new  PFT:  09/27/18 FVC 3.13 (120%) FEV1 1.84 (99%) Ratio 71  TLC 92% DLCO 42% Interpretation: Normal spirometry  Labs: Absolute Eosinophils  11/06/2017: 300 04/28/18: 200 04/25/21: 300  Assessment & Plan:  76year old female active smoker with hx right breast cancer, osteoporosis and emphysema who presents for follow-up. Recent  hospitalization for exacerbation. Prefers Trelegy and will contact office if cost too high. Discussed clinical course and management of COPD/asthma including bronchodilator regimen and action plan for exacerbation (prefers low dose prednisone).  GOLD Class C  --Completed prednisone --STOP Flovent  --CONTINUE Anoro --START Trelegy 200 ONE puff ONCE a day --CONTINUE chronic macrolide. Azithromycin M-W-F  Nebulizers - Use twice a day --Please verify which one you have --Duoneb PREFERRED however this can cause palpitations --Atrovent   Tobacco abuse Patient is an active smoker. Not interested in quitting  Concern for aspiration --Call our office if you wish to pursue swallow evaluation/therapy --Provided aspiration precautions handout  Palpitations --Followed by Cardiology with Dr. OAudie Box  Health Maintenance Immunization History  Administered Date(s) Administered   Influenza Inj Mdck Quad Pf 01/23/2018   Influenza, High Dose Seasonal PF 01/18/2021   Influenza-Unspecified 01/13/2015, 02/08/2016, 01/16/2017, 01/28/2018   Janssen (J&J) SARS-COV-2 Vaccination 07/01/2019, 02/12/2020   Pneumococcal Polysaccharide-23 08/10/2016   Pneumococcal-Unspecified 08/02/2014   Tdap 08/02/2014   CT Lung Screen - Enrolled. Due 05/2021  No orders of the defined types were placed in this encounter.   No orders of the defined types were placed in this encounter.   No follow-ups on file.  I have spent a total time of 30-minutes on the day of the appointment including chart review, data review, collecting history, coordinating care and discussing medical diagnosis and plan with the patient/family. Past medical history, allergies, medications were reviewed. Pertinent imaging, labs and tests included in this note have been reviewed and interpreted independently by me.  Drevin Ortner JRodman Pickle MD [facility] 10/15/2021 11:50 AM

## 2021-10-16 ENCOUNTER — Encounter: Payer: Self-pay | Admitting: Pulmonary Disease

## 2021-10-16 MED ORDER — TRELEGY ELLIPTA 200-62.5-25 MCG/ACT IN AEPB: 1.0000 | INHALATION_SPRAY | Freq: Every day | RESPIRATORY_TRACT | 5 refills | Status: DC

## 2021-10-27 ENCOUNTER — Encounter: Payer: Self-pay | Admitting: Pulmonary Disease

## 2021-10-29 MED ORDER — NICOTINE 14 MG/24HR TD PT24: 14.0000 mg | MEDICATED_PATCH | Freq: Every day | TRANSDERMAL | 1 refills | Status: DC

## 2021-10-29 NOTE — Telephone Encounter (Signed)
Please advise on the Mychart.

## 2021-10-29 NOTE — Telephone Encounter (Signed)
Ok to send script to pharmacy patient requested

## 2021-11-12 ENCOUNTER — Encounter: Payer: Self-pay | Admitting: Cardiovascular Disease

## 2021-11-16 NOTE — Progress Notes (Unsigned)
Cardiology Office Note:   Date:  0/04/7492  NAME:  Elaine [name]    MRN: [id number] DOB:  [birth year]   PCP:  Quentin Cornwall, MD  Cardiologist:  None  Electrophysiologist:  None   Referring MD: Quentin Cornwall, MD   Chief Complaint  Patient presents with   Follow-up    History of Present Illness:   Elaine [name] is a 76 y.o. female with a hx of COPD, hypertension, paroxysmal atrial fibrillation who presents for follow-up.  Diagnosed with paroxysmal atrial fibrillation at her last visit.  Stress test normal.  Monitor with brief arrhythmia.  Echo unremarkable. She reports she was admitted to [facility] in July.  Had a COPD exacerbation.  She brings records today.  She apparently had elevated troponin values.  This was all attributed to COPD.  No cardiac work-up was pursued given her recent normal stress test and normal echocardiogram through [place].  She had no atrial fibrillation diagnosed.  She had no atrial fibrillation captured on her Apple Watch.  She seems to be doing well.  Denies any chest pain or trouble breathing.  She did not tolerate diltiazem.  She had brief SVT seen on her monitor but was unable to tolerate this.  She is doing well without it.  She is still smoking several cigarettes per day.  Working on quitting.  Overall seems to be doing quite well.  Problem List COPD -Moderate HTN Paroxysmal Afib -CHADSVASC=4 (age, female, HTN) -MPI normal 08/15/2021  Past Medical History: Past Medical History:  Diagnosis Date   Arrhythmia    Breast cancer ([place]) 1995   left    Cataract    Complication of anesthesia    Emphysema of lung ([place])    Family history of breast cancer    Hiatal hernia 1992   Hypertension    Hyperthyroidism    Osteoporosis    PONV (postoperative nausea and vomiting)     Past Surgical History: Past Surgical History:  Procedure Laterality Date   BRONCHIAL WASHINGS  05/01/2021   Procedure: BRONCHIAL WASHINGS;  Surgeon: [name], MD;  Location: [facility];  Service: Cardiopulmonary;;   CATARACT EXTRACTION     MASTECTOMY Left 1995   [place] Right 12/30/2017   MASTECTOMY W/ SENTINEL NODE BIOPSY Right 12/30/2017   Procedure: MASTECTOMY WITH SENTINEL LYMPH NODE BIOPSY;  Surgeon: Rolm Bookbinder, MD;  Location: [facility];  Service: General;  Laterality: Right;   TOTAL ABDOMINAL HYSTERECTOMY  06/22/1993   VIDEO BRONCHOSCOPY N/A 05/01/2021   Procedure: VIDEO BRONCHOSCOPY WITHOUT FLUORO;  Surgeon: [name], MD;  Location: [facility];  Service: Cardiopulmonary;  Laterality: N/A;    Current Medications: Current Meds  Medication Sig   apixaban (ELIQUIS) 5 MG TABS tablet Take 1 tablet (5 mg total) by mouth 2 (two) times daily.   aspirin 81 MG EC tablet Take 81 mg by mouth daily. Swallow whole.   Azelastine HCl 137 MCG/SPRAY SOLN Place into both nostrils.   azithromycin (ZITHROMAX) 250 MG tablet Take 1 tablet (250 mg total) by mouth every Monday, Wednesday, and Friday.   denosumab (PROLIA) 60 MG/ML SOSY injection Inject 60 mg into the skin every 6 (six) months.    Fluticasone-Umeclidin-Vilant (TRELEGY ELLIPTA) 200-62.5-25 MCG/ACT AEPB Inhale 1 puff into the lungs daily.   ipratropium (ATROVENT) 0.02 % nebulizer solution Take 2.5 mLs (0.5 mg total) by nebulization every 6 (six) hours as needed for wheezing or shortness of breath.   levothyroxine (  SYNTHROID) 50 MCG tablet Take 50 mcg by mouth daily before breakfast.   losartan (COZAAR) 50 MG tablet Take 50 mg by mouth daily.   nicotine (NICODERM CQ - DOSED IN MG/24 HOURS) 14 mg/24hr patch Place 1 patch (14 mg total) onto the skin daily.   OXYGEN Inhale 2 L into the lungs continuous.   rosuvastatin (CRESTOR) 5 MG tablet Take 5 mg by mouth daily.     Allergies:    Bee venom, Codeine, Macrobid [nitrofurantoin macrocrystal], Morphine and related, Oxycodone, Penicillins, and Tyloxapol   Social History: Social History   Socioeconomic  History   Marital status: Single    Spouse name: Not on file   Number of children: Not on file   Years of education: Not on file   Highest education level: Not on file  Occupational History   Occupation: Accountant  Tobacco Use   Smoking status: Every Day    Packs/day: 1.00    Years: 50.00    Total pack years: 50.00    Types: Cigarettes   Smokeless tobacco: Never   Tobacco comments:    2 cigarettes per day-- ARJ 10/15/21  Vaping Use   Vaping Use: Never used  Substance and Sexual Activity   Alcohol use: Yes    Comment: Martini daily   Drug use: Never   Sexual activity: Not on file  Other Topics Concern   Not on file  Social History Narrative   Not on file   Social Determinants of Health   Financial Resource Strain: Not on file  Food Insecurity: Not on file  Transportation Needs: Not on file  Physical Activity: Not on file  Stress: Not on file  Social Connections: Not on file     Family History: The patient's family history includes Aneurysm in her mother; Breast cancer in her cousin and cousin; Breast cancer (age of onset: 6) in her cousin; Diabetes in her sister and sister; Heart attack in her father.  ROS:   All other ROS reviewed and negative. Pertinent positives noted in the HPI.     EKGs/Labs/Other Studies Reviewed:   The following studies were personally reviewed by me today:  [place] 09/12/2021 Impression: 1. Brief supraventricular tachycardia detected. Findings either represent atrial tachycardia vs paroxysmal atrial fibrillation. 2 episodes in 14 days. Longest duration 5 seconds.  2. Rare ectopy.  TTE 08/20/2021  1. Left ventricular ejection fraction, by estimation, is 60 to 65%. The  left ventricle has normal function. The left ventricle has no regional  wall motion abnormalities. There is mild left ventricular hypertrophy.  Left ventricular diastolic parameters  are indeterminate.   2. Right ventricular systolic function is normal. The right ventricular   size is normal. Tricuspid regurgitation signal is inadequate for assessing  PA pressure.   3. The mitral valve is degenerative. No evidence of mitral valve  regurgitation. No evidence of mitral stenosis. Moderate mitral annular  calcification.   4. The aortic valve is tricuspid. Aortic valve regurgitation is not  visualized. Aortic valve sclerosis is present. AV gradients were not  measured, but visually does not appear stenotic   5. The inferior vena cava is normal in size with greater than 50%  respiratory variability, suggesting right atrial pressure of 3 mmHg.  NM stress 08/15/2021   The study is normal. There are no perfusion defects consistent with prior infarct or current ischemia The study is low risk.   No ST deviation was noted.   Left ventricular function is normal. Nuclear stress EF:  61 %. The left ventricular ejection fraction is normal (55-65%). End diastolic cavity size is normal.  Recent Labs: 04/17/2021: BUN 17; Creatinine, Ser 0.78; Potassium 4.3; Sodium 140 04/25/2021: Hemoglobin 14.5; Platelets 257.0   Recent Lipid Panel No results found for: "CHOL", "TRIG", "HDL", "CHOLHDL", "VLDL", "LDLCALC", "LDLDIRECT"  Physical Exam:   VS:  BP 130/80   Pulse 81   Ht '5\' 3"'$  (1.6 m)   Wt 146 lb (66.2 kg)   SpO2 96%   BMI 25.86 kg/m    Wt Readings from Last 3 Encounters:  11/21/21 146 lb (66.2 kg)  10/15/21 139 lb 12.8 oz (63.4 kg)  08/10/21 146 lb 12.8 oz (66.6 kg)    General: Well nourished, well developed, in no acute distress Head: Atraumatic, normal size  Eyes: PEERLA, EOMI  Neck: Supple, no JVD Endocrine: No thryomegaly Cardiac: Normal S1, S2; RRR; no murmurs, rubs, or gallops Lungs: Expiratory wheezing noted Abd: Soft, nontender, no hepatomegaly  Ext: No edema, pulses 2+ Musculoskeletal: No deformities, BUE and BLE strength normal and equal Skin: Warm and dry, no rashes   Neuro: Alert and oriented to person, place, time, and situation, CNII-XII grossly intact,  no focal deficits  Psych: Normal mood and affect   ASSESSMENT:   Elaine [name] is a 76 y.o. female who presents for the following: 1. Paroxysmal atrial fibrillation (HCC)   2. Acquired thrombophilia ([place])     PLAN:   1. Paroxysmal atrial fibrillation (HCC) 2. Acquired thrombophilia ([place]) -Atrial fibrillation has been captured on her Apple Watch in the past.  Monitor proceed by had no A-fib.  Echocardiogram normal.  Lexi scan nuclear medicine stress test also normal.  She is on Eliquis 5 mg twice daily.  No bleeding issues.  She did not tolerate diltiazem due to bradycardia.  Okay to continue to hold this for now.  She had no recurrence of her atrial fibrillation and only had brief A-fib captured on Apple Watch.  Unclear what to make of her admission to [facility].  All I can tell is that she had a COPD exacerbation and likely troponin elevation secondary to this.  She has had no further chest pain episodes or shortness of breath.  We will continue current therapy for now.   Disposition: Return in about 6 months (around 05/24/2022).  Medication Adjustments/Labs and Tests Ordered: Current medicines are reviewed at length with the patient today.  Concerns regarding medicines are outlined above.  No orders of the defined types were placed in this encounter.  No orders of the defined types were placed in this encounter.   Patient Instructions  Medication Instructions:  Your physician recommends that you continue on your current medications as directed. Please refer to the Current Medication list given to you today.   Labwork: None  Testing/Procedures: None  Follow-Up: Follow up with Dr. Jenetta DownerNori Riis in 6 months.   Any Other Special Instructions Will Be Listed Below (If Applicable).     If you need a refill on your cardiac medications before your next appointment, please call your pharmacy.    Time Spent with Patient: I have spent a total of 25 minutes with patient reviewing  hospital notes, telemetry, EKGs, labs and examining the patient as well as establishing an assessment and plan that was discussed with the patient.  > 50% of time was spent in direct patient care.  Signed, Addison Naegeli. Audie Box, MD, [facility]  [address], [place], [place] [address] [phone]  11/21/2021 2:41 PM

## 2021-11-21 ENCOUNTER — Encounter: Payer: Self-pay | Admitting: Cardiovascular Disease

## 2021-11-21 ENCOUNTER — Encounter: Payer: Self-pay | Admitting: Pulmonary Disease

## 2021-11-21 ENCOUNTER — Ambulatory Visit (INDEPENDENT_AMBULATORY_CARE_PROVIDER_SITE_OTHER): Payer: Medicare Other | Admitting: Cardiovascular Disease

## 2021-11-21 VITALS — BP 130/80 | HR 81 | Ht 63.0 in | Wt 146.0 lb

## 2021-11-21 DIAGNOSIS — I480 Paroxysmal atrial fibrillation: Secondary | ICD-10-CM | POA: Diagnosis not present

## 2021-11-21 DIAGNOSIS — D6869 Other thrombophilia: Secondary | ICD-10-CM | POA: Diagnosis not present

## 2021-11-21 NOTE — Patient Instructions (Signed)
Medication Instructions:  Your physician recommends that you continue on your current medications as directed. Please refer to the Current Medication list given to you today.   Labwork: None  Testing/Procedures: None  Follow-Up: Follow up with Dr. Jenetta DownerNori Riis in 6 months.   Any Other Special Instructions Will Be Listed Below (If Applicable).     If you need a refill on your cardiac medications before your next appointment, please call your pharmacy.

## 2021-11-22 MED ORDER — NICOTINE 14 MG/24HR TD PT24
14.0000 mg | MEDICATED_PATCH | Freq: Every day | TRANSDERMAL | 0 refills | Status: DC
Start: 2021-11-22 — End: 2022-01-01

## 2021-11-22 MED ORDER — BUPROPION HCL ER (SR) 150 MG PO TB12: ORAL_TABLET | ORAL | 0 refills | Status: DC

## 2021-11-22 MED ORDER — NICOTINE 21 MG/24HR TD PT24
21.0000 mg | MEDICATED_PATCH | Freq: Every day | TRANSDERMAL | 0 refills | Status: DC
Start: 2021-11-22 — End: 2022-01-01

## 2021-11-22 NOTE — Telephone Encounter (Signed)
[facility] Telephone Encounter  Contacted patient regarding smoking cessation. Still having significant cravings on Step 2 patches despite lozenges.  We discussed starting pharmacotherapy in addition to patches to aid. She is not interested in Varenicline. Would be willing to try wellbutrin after discussion. Medical hx reviewed and no hx of seizures.  Plan --Start wellbutrin as prescribed (1 tab daily x 3 days, increased to BID thereafter) --Return to Step 1 for two weeks followed by Step 2 for 8 weeks  Keep follow-up with me in October

## 2021-11-22 NOTE — Telephone Encounter (Signed)
Dr. Loanne Drilling, please advise on pt's message. She is seeking advise as she is still having strong tobacco cravings despite patches.

## 2021-11-25 ENCOUNTER — Encounter: Payer: Self-pay | Admitting: Pulmonary Disease

## 2021-11-26 NOTE — Telephone Encounter (Signed)
Message received from patient.  After reading the medication material, I have opted to wait on taking this medication. Reason:  I just recently had cataract surgery on both eyes.  Once I am released from this procedure, I will then take the medication. Thank you! Sonora P. Mcvicar dob 04-24-2045  Message routed to Dr. Loanne Drilling as [name]

## 2021-12-06 ENCOUNTER — Encounter: Payer: Self-pay | Admitting: Cardiovascular Disease

## 2021-12-06 NOTE — Telephone Encounter (Signed)
Patient informed to continue taking eliquis as long as bleeding has stopped. She will contact clinic if any bleeding occurs.

## 2021-12-06 NOTE — Telephone Encounter (Signed)
Patient reported she had a dust mask on and doing yard work when she got a nosebleed. She estimated about 1 Tbsp blood in the mask. She bleed for about 10 minutes. Denies dizziness. While on phone, BP 136/76, P 83, sat 94. She wants to know if she should hold eliquis for a day or so. Please advise.

## 2021-12-07 ENCOUNTER — Encounter: Payer: Self-pay | Admitting: Cardiovascular Disease

## 2021-12-07 DIAGNOSIS — R040 Epistaxis: Secondary | ICD-10-CM

## 2021-12-09 ENCOUNTER — Encounter: Payer: Self-pay | Admitting: Pulmonary Disease

## 2021-12-11 NOTE — Telephone Encounter (Signed)
FYI

## 2021-12-11 NOTE — Telephone Encounter (Signed)
Spoke to patient she stated she has a appointment with [facility] 9/21.Stated Dr.Teah's office not very cooperative.I will make Dr.O'Neal's nurse aware.

## 2021-12-12 NOTE — Telephone Encounter (Signed)
Noted! Thank you

## 2021-12-30 ENCOUNTER — Other Ambulatory Visit: Payer: Self-pay | Admitting: Pulmonary Disease

## 2022-01-01 ENCOUNTER — Encounter: Payer: Self-pay | Admitting: Pulmonary Disease

## 2022-01-01 ENCOUNTER — Ambulatory Visit (INDEPENDENT_AMBULATORY_CARE_PROVIDER_SITE_OTHER): Payer: Medicare Other | Admitting: Pulmonary Disease

## 2022-01-01 VITALS — BP 120/70 | HR 61 | Ht 63.0 in | Wt 143.6 lb

## 2022-01-01 DIAGNOSIS — J432 Centrilobular emphysema: Secondary | ICD-10-CM

## 2022-01-01 NOTE — Patient Instructions (Signed)
GOLD Class C  --CONTINUE Trelegy 200 ONE puff ONCE a day --CONTINUE chronic macrolide. Azithromycin M-W-F --Discussed vaccinations including influenza, COVID, RSV and pneumococcal   Nebulizers - Use twice a day --Duoneb PREFERRED however this can cause palpitations  Tobacco abuse Patient is an active smoker START wellbutrin if interested in smoking  Follow-up with me in 6 months

## 2022-01-01 NOTE — Progress Notes (Signed)
Subjective:   PATIENT ID: Elaine [name] GENDER: female DOB: [birth year], MRN: [id number]   HPI  Chief Complaint  Patient presents with   Follow-up    Doing good, other than 4hr nose bleed believes due to blood thinner    Ms. Kristelle Cavallaro is a 76 year-old female with hx right breast cancer s/p mastectomy who presents for COPD follow-up  2019 - Established care with [facility] in November. On Anoro and chronic macrolide 2020 - Outpatient exacerbation Dec 2021 - Urgent care for COPD exacerbation in July 2022 - Hospitalized for COPD exacerbation in June 2023 - PNA followed by recurrent hemoptysis > bronchoscopy 05/01/21 demonstrated old blood clot occluding subsegment of left upper lobe which was aspirated. Outpatient COPD exacerbation in Feb and April and May. Hospitalized in July.  08/02/21 She reports recent exacerbation in April with improvement on prednisone. Still some mild productive cough and shortness of breath on exertion.Denies wheezing, fevers, chills. Her oxygen is 92% on room air. Usually wears O2 at night unless she is having an exacerbation.   She reports when she has difficulty breathing, her apple watch shows atrial fibrillation which improves to sinus rhythm at rest. Denies chest pain, dizziness. This has been occurring since Feb/March and wishes to see a Cardiologist at [facility].  10/15/21 Since our last visit she was hospitalized from 7/10-7/11 for COPD exacerbation. Discharge summary reviewed from [facility] in [place]. CTA 10/08/21 negative for PE and no acute findings. Discharged on nicotine patch and lozenges and prednisone pack. Since discharge she reports severe nasal congestion and has more drainage. She has continued on a nicotine patch and lozenges, only smoking 1-2 cigarettes a day at most. Has been taking Trelegy and feels this is working well for her. Denies cough, shortness of breath or wheezing at this time.  01/01/22 Since our last visit on 9/7 had a minor nose  bleed followed by 9/8 major nosebleed. Discontinued eliquis after discussing with Cardiology and scheduled for ENT in [place]. ENT exam with no obvious source of bleeding. Has not started the wellbutrin. Currently smoking up to 1/4 ppd on some days. Denies shortness of breath, cough or wheezing. Compliant with Trelegy and nebulizer daily. Compliant with her oxygen at night.  Social: 50 pack year history. Quit smoking 01/2018. Currently smoking  Past Medical History:  Diagnosis Date   Arrhythmia    Breast cancer ([place]) 1995   left    Cataract    Complication of anesthesia    Emphysema of lung ([place])    Family history of breast cancer    Hiatal hernia 1992   Hypertension    Hyperthyroidism    Osteoporosis    PONV (postoperative nausea and vomiting)       Social History   Occupational History   Occupation: Optometrist  Tobacco Use   Smoking status: Every Day    Packs/day: 1.00    Years: 50.00    Total pack years: 50.00    Types: Cigarettes   Smokeless tobacco: Never   Tobacco comments:    2 cigarettes per day-- [facility]J 10/15/21  Vaping Use   Vaping Use: Never used  Substance and Sexual Activity   Alcohol use: Yes    Comment: Martini daily   Drug use: Never   Sexual activity: Not on file    Allergies  Allergen Reactions   Bee Venom Other (See Comments)    Unknown   Codeine Nausea Only   Macrobid [Nitrofurantoin Macrocrystal] Other (See Comments)  Unknown   Morphine And Related Other (See Comments)    Unknown   Oxycodone Nausea Only   Penicillins Other (See Comments)    Unknown Has patient had a PCN reaction causing immediate rash, facial/tongue/throat swelling, SOB or lightheadedness with hypotension: Unknown Has patient had a PCN reaction causing severe rash involving mucus membranes or skin necrosis: Unknown Has patient had a PCN reaction that required hospitalization: Unknown Has patient had a PCN reaction occurring within the last 10 years: No If all of the  above answers are "NO", then may proceed with Cephalosporin use.    Tyloxapol Nausea Only and Other (See Comments)    Extreme Drugged feeling, Doesn't affect pain     Outpatient Medications Prior to Visit  Medication Sig Dispense Refill   aspirin 81 MG EC tablet Take 81 mg by mouth daily. Swallow whole.     Azelastine HCl 137 MCG/SPRAY SOLN Place into both nostrils.     azithromycin (ZITHROMAX) 250 MG tablet Take 1 tablet (250 mg total) by mouth every Monday, Wednesday, and Friday. 39 tablet 3   buPROPion (WELLBUTRIN SR) 150 MG 12 hr tablet TAKE 1 TABLET BY MOUTH EVERY MORNING FOR 3 DAYS THEN AFTER THAT TAKE 1 TABLET BY MOUTH EVERY MORNING AND EVENING 180 tablet 1   denosumab (PROLIA) 60 MG/ML SOSY injection Inject 60 mg into the skin every 6 (six) months.      Fluticasone-Umeclidin-Vilant (TRELEGY ELLIPTA) 200-62.5-25 MCG/ACT AEPB Inhale 1 puff into the lungs daily. 60 each 5   ipratropium-albuterol (DUONEB) 0.5-2.5 (3) MG/3ML SOLN Inhale 3 mLs into the lungs See admin instructions. Use 1 vial via nebulizer daily. May use additional vial if needed at bedtime for shortness of breath. 360 mL 3   levothyroxine (SYNTHROID) 50 MCG tablet Take 50 mcg by mouth daily before breakfast.     losartan (COZAAR) 50 MG tablet Take 50 mg by mouth daily.     mupirocin ointment (BACTROBAN) 2 % Apply a small amount twice daily to affected area for 14 days.     OXYGEN Inhale 2 L into the lungs continuous.     rosuvastatin (CRESTOR) 5 MG tablet Take 5 mg by mouth daily.     VENTOLIN HFA 108 (90 Base) MCG/ACT inhaler INHALE 2 PUFFS INTO THE LUNGS EVERY 4 HOURS AS NEEDED FOR WHEEZING OR SHORTNESS OF BREATH. 54 each 1   ipratropium (ATROVENT) 0.02 % nebulizer solution Take 2.5 mLs (0.5 mg total) by nebulization every 6 (six) hours as needed for wheezing or shortness of breath. 300 mL 0   albuterol (VENTOLIN HFA) 108 (90 Base) MCG/ACT inhaler Inhale 2 puffs into the lungs every 6 (six) hours as needed for wheezing or  shortness of breath. (Patient not taking: Reported on 08/10/2021) 24 g 2   apixaban (ELIQUIS) 5 MG TABS tablet Take 1 tablet (5 mg total) by mouth 2 (two) times daily. 180 tablet 1   Azelastine HCl 0.15 % SOLN 2 spray(s) intranasally 2 times a day for 30 days     nicotine (NICODERM CQ - DOSED IN MG/24 HOURS) 14 mg/24hr patch Place 1 patch (14 mg total) onto the skin daily. 28 patch 1   nicotine (NICOTINE STEP 1) 21 mg/24hr patch Place 1 patch (21 mg total) onto the skin daily. 14 patch 0   nicotine (TGT NICOTINE STEP TWO) 14 mg/24hr patch Place 1 patch (14 mg total) onto the skin daily. Start after completing Step 1 56 patch 0   No facility-administered medications prior to  visit.    Review of Systems  Constitutional:  Negative for chills, diaphoresis, fever, malaise/fatigue and weight loss.  HENT:  Negative for congestion.        Epistaxis  Respiratory:  Negative for cough, hemoptysis, sputum production, shortness of breath and wheezing.   Cardiovascular:  Negative for chest pain, palpitations and leg swelling.   Objective:    Vitals:   01/01/22 1114  BP: 120/70  Pulse: 61  SpO2: 96%  Weight: 143 lb 9.6 oz (65.1 kg)  Height: '5\' 3"'$  (1.6 m)   SpO2: 96 % O2 Device: None (Room air)  Physical Exam: General: Well-appearing, no acute distress HENT: [place], AT Eyes: EOMI, no scleral icterus Respiratory: Clear to auscultation bilaterally.  No crackles, wheezing or rales Cardiovascular: RRR, -M/R/G, no JVD Extremities:-Edema,-tenderness Neuro: AAO x4, CNII-XII grossly intact Psych: Normal mood, normal affect  Data Reviewed  Chest imaging: CT Chest 07/24/17 - Severe emphysema. RML groundglass nodule 62m CT Chest 03/09/18 - Severe emphysema. Interval resolution of ground glass nodule CT Chest 05/05/20 - Emphysema present. 4.7 mm noncalcified nodule. CTA 04/17/21 - No PE. Emphysema. LUL patchy infiltrate. 5 mm LLL groundglass nodule which is new  PFT:  09/27/18 FVC 3.13 (120%) FEV1 1.84  (99%) Ratio 71  TLC 92% DLCO 42% Interpretation: Normal spirometry  Labs: Absolute Eosinophils  11/06/2017: 300 04/28/18: 200 04/25/21: 300  Assessment & Plan:  76year old active smoker with hx right breast cancer, osteoporosis and emphysema who presents for follow-up. Well-controlled on Trelegy and nocturnal oxygen. Discussed clinical course and management of COPD including smoking cessation, bronchodilator regimen and action plan for exacerbation (prefers low dose prednisone).  GOLD Class C  Nocturnal hypoxemia --CONTINUE Trelegy 200 ONE puff ONCE a day --CONTINUE chronic macrolide. Azithromycin M-W-F --CONTINUE oxygen nightly --Discussed vaccinations including influenza, COVID, RSV and pneumococcal   Nebulizers - Use twice a day --Duoneb PREFERRED however this can cause palpitations  Tobacco abuse Patient is an active smoker START wellbutrin if interested in smoking  Health Maintenance Immunization History  Administered Date(s) Administered   Influenza Inj Mdck Quad Pf 01/23/2018   Influenza, High Dose Seasonal PF 01/18/2021   Influenza-Unspecified 01/13/2015, 02/08/2016, 01/16/2017, 01/28/2018   Janssen (J&J) SARS-COV-2 Vaccination 07/01/2019, 02/12/2020   Pneumococcal Polysaccharide-23 08/10/2016   Pneumococcal-Unspecified 08/02/2014   Tdap 08/02/2014   CT Lung Screen - Enrolled. Due 04/2022  No orders of the defined types were placed in this encounter.  No orders of the defined types were placed in this encounter.   Return in about 6 months (around 07/03/2022).  I have spent a total time of 32-minutes on the day of the appointment including chart review, data review, collecting history, coordinating care and discussing medical diagnosis and plan with the patient/family. Past medical history, allergies, medications were reviewed. Pertinent imaging, labs and tests included in this note have been reviewed and interpreted independently by me.  Agostino Gorin JRodman Pickle MD [facility] 01/01/2022 2:10 PM

## 2022-03-20 ENCOUNTER — Other Ambulatory Visit: Payer: Self-pay | Admitting: Pulmonary Disease

## 2022-04-09 ENCOUNTER — Telehealth: Payer: Self-pay | Admitting: Pulmonary Disease

## 2022-04-09 NOTE — Telephone Encounter (Signed)
Received a call from Nicole Kindred with [facility] who stated he faxed over a CMN to our office multiple times. The last time this was faxed over was on 12/29.  Please advise if CMN was ever received. This has been being faxed to [phone].    If there is a different fax number that would work best if this has not been received, please advise on that as well.

## 2022-04-09 NOTE — Telephone Encounter (Signed)
I havent seen anything they can fax to my fax [phone]

## 2022-04-10 NOTE — Telephone Encounter (Signed)
Spk to patient appt scheduled [facility] location

## 2022-04-10 NOTE — Telephone Encounter (Signed)
-----   Message from [place], MD sent at 04/05/2022  7:55 AM EST ----- Recall is in but please go ahead and schedule patient for March follow-up with me. She has not been to [place] location yet and lives in [place] so needs to plan ahead

## 2022-05-13 ENCOUNTER — Encounter: Payer: Self-pay | Admitting: Cardiovascular Disease

## 2022-05-15 ENCOUNTER — Emergency Department (HOSPITAL_COMMUNITY)
Admission: EM | Admit: 2022-05-15 | Discharge: 2022-05-15 | Disposition: A | Discharge status: Home / Self Care | Payer: Medicare Other | Attending: Emergency Medicine | Admitting: Emergency Medicine

## 2022-05-15 ENCOUNTER — Encounter (HOSPITAL_COMMUNITY): Payer: Self-pay | Admitting: Emergency Medicine

## 2022-05-15 ENCOUNTER — Emergency Department (HOSPITAL_COMMUNITY): Payer: Medicare Other

## 2022-05-15 ENCOUNTER — Other Ambulatory Visit: Payer: Self-pay

## 2022-05-15 DIAGNOSIS — Z7982 Long term (current) use of aspirin: Secondary | ICD-10-CM | POA: Diagnosis not present

## 2022-05-15 DIAGNOSIS — N132 Hydronephrosis with renal and ureteral calculous obstruction: Secondary | ICD-10-CM | POA: Diagnosis not present

## 2022-05-15 DIAGNOSIS — Z853 Personal history of malignant neoplasm of breast: Secondary | ICD-10-CM | POA: Diagnosis not present

## 2022-05-15 DIAGNOSIS — I10 Essential (primary) hypertension: Secondary | ICD-10-CM | POA: Diagnosis not present

## 2022-05-15 DIAGNOSIS — K5792 Diverticulitis of intestine, part unspecified, without perforation or abscess without bleeding: Secondary | ICD-10-CM | POA: Insufficient documentation

## 2022-05-15 DIAGNOSIS — Z79899 Other long term (current) drug therapy: Secondary | ICD-10-CM | POA: Diagnosis not present

## 2022-05-15 DIAGNOSIS — N200 Calculus of kidney: Secondary | ICD-10-CM

## 2022-05-15 DIAGNOSIS — E039 Hypothyroidism, unspecified: Secondary | ICD-10-CM | POA: Insufficient documentation

## 2022-05-15 DIAGNOSIS — R300 Dysuria: Secondary | ICD-10-CM | POA: Insufficient documentation

## 2022-05-15 DIAGNOSIS — R109 Unspecified abdominal pain: Secondary | ICD-10-CM | POA: Diagnosis present

## 2022-05-15 LAB — COMPREHENSIVE METABOLIC PANEL
ALT: 11 U/L (ref 0–44)
AST: 16 U/L (ref 15–41)
Albumin: 3.9 g/dL (ref 3.5–5.0)
Alkaline Phosphatase: 64 U/L (ref 38–126)
Anion gap: 10 (ref 5–15)
BUN: 21 mg/dL (ref 8–23)
CO2: 24 mmol/L (ref 22–32)
Calcium: 9 mg/dL (ref 8.9–10.3)
Chloride: 107 mmol/L (ref 98–111)
Creatinine, Ser: 0.99 mg/dL (ref 0.44–1.00)
GFR, Estimated: 59 mL/min — ABNORMAL LOW (ref >60)
Glucose, Bld: 105 mg/dL — ABNORMAL HIGH (ref 70–99)
Potassium: 3.8 mmol/L (ref 3.5–5.1)
Sodium: 141 mmol/L (ref 135–145)
Total Bilirubin: 0.9 mg/dL (ref 0.3–1.2)
Total Protein: 6.7 g/dL (ref 6.5–8.1)

## 2022-05-15 LAB — URINALYSIS, ROUTINE W REFLEX MICROSCOPIC
Bilirubin Urine: NEGATIVE
Glucose, UA: NEGATIVE mg/dL
Hgb urine dipstick: NEGATIVE
Ketones, ur: 5 mg/dL — AB
Nitrite: NEGATIVE
Protein, ur: NEGATIVE mg/dL
Specific Gravity, Urine: 1.026 (ref 1.005–1.030)
pH: 5 (ref 5.0–8.0)

## 2022-05-15 LAB — CBC WITH DIFFERENTIAL/PLATELET
Abs Immature Granulocytes: 0.03 10*3/uL (ref 0.00–0.07)
Basophils Absolute: 0 10*3/uL (ref 0.0–0.1)
Basophils Relative: 0 %
Eosinophils Absolute: 0.1 10*3/uL (ref 0.0–0.5)
Eosinophils Relative: 2 %
HCT: 43.6 % (ref 36.0–46.0)
Hemoglobin: 14.8 g/dL (ref 12.0–15.0)
Immature Granulocytes: 0 %
Lymphocytes Relative: 25 %
Lymphs Abs: 1.7 10*3/uL (ref 0.7–4.0)
MCH: 31.8 pg (ref 26.0–34.0)
MCHC: 33.9 g/dL (ref 30.0–36.0)
MCV: 93.6 fL (ref 80.0–100.0)
Monocytes Absolute: 0.8 10*3/uL (ref 0.1–1.0)
Monocytes Relative: 12 %
Neutro Abs: 4.1 10*3/uL (ref 1.7–7.7)
Neutrophils Relative %: 61 %
Platelets: 217 10*3/uL (ref 150–400)
RBC: 4.66 MIL/uL (ref 3.87–5.11)
RDW: 13.4 % (ref 11.5–15.5)
WBC: 6.8 10*3/uL (ref 4.0–10.5)
nRBC: 0 % (ref 0.0–0.2)

## 2022-05-15 MED ORDER — HYDROCODONE-ACETAMINOPHEN 5-325 MG PO TABS: 1.0000 | ORAL_TABLET | Freq: Four times a day (QID) | ORAL | 0 refills | Status: DC | PRN

## 2022-05-15 MED ORDER — METRONIDAZOLE 500 MG PO TABS: 500.0000 mg | ORAL_TABLET | Freq: Two times a day (BID) | ORAL | 0 refills | Status: DC

## 2022-05-15 MED ORDER — CIPROFLOXACIN HCL 500 MG PO TABS: 500.0000 mg | ORAL_TABLET | Freq: Two times a day (BID) | ORAL | 0 refills | Status: DC

## 2022-05-15 MED ORDER — ONDANSETRON 4 MG PO TBDP: 4.0000 mg | ORAL_TABLET | Freq: Three times a day (TID) | ORAL | 0 refills | Status: DC | PRN

## 2022-05-15 NOTE — ED Triage Notes (Signed)
Patient states she has had trouble with urination & right sided flank pain x5days. Was seen at a urgent care in [facility] and was told she did not have a kidney stone or a UTI and to F/U with PCP. Pcp scheduled pt to have a CT on Monday 2/19 but patient does not wish to wait till then. States she is still having lower abdominal pressure, right sided flank pain and feels like she can not empty her bladder. Denies painful urination or blood in urine.

## 2022-05-15 NOTE — ED Provider Notes (Signed)
[facility] Provider Note   CSN: [id number] Arrival date & time: 05/15/22  1200     History {Add pertinent medical, surgical, social history, OB history to HPI:1} Chief Complaint  Patient presents with   Urinary Retention    Elaine [name] is a 77 y.o. female.  HPI Patient presents with flank pain.  Has had for around 5 days now.  Seen in urgent care and reportedly had urine did not show infection but did have some blood.  Had KUB that did not show stone.  Has had dysuria.  Pain will get to the right side and get severe.  Nausea since that time.  No fevers.  No history of kidney stones.  Had been seen at urgent care and had plan for outpatient CT scan but is not to be done for another 5 days.  States continued pain and does not think she can wait till Monday.   Past Medical History:  Diagnosis Date   Arrhythmia    Breast cancer ([place]) 1995   left    Cataract    Complication of anesthesia    Emphysema of lung ([place])    Family history of breast cancer    Hiatal hernia 1992   Hypertension    Hyperthyroidism    Osteoporosis    PONV (postoperative nausea and vomiting)     Home Medications Prior to Admission medications   Medication Sig Start Date End Date Taking? Authorizing Provider  aspirin 81 MG EC tablet Take 81 mg by mouth daily. Swallow whole.    [provider]  Azelastine HCl 0.15 % SOLN 2 spray(s) intranasally 2 times a day for 30 days    [provider]  Azelastine HCl 137 MCG/SPRAY SOLN Place into both nostrils. 11/24/20   [provider]  azithromycin (ZITHROMAX) 250 MG tablet Take 1 tablet (250 mg total) by mouth every Monday, Wednesday, and Friday. 05/16/21   [name], MD  buPROPion (WELLBUTRIN SR) 150 MG 12 hr tablet TAKE 1 TABLET BY MOUTH EVERY MORNING FOR 3 DAYS THEN AFTER THAT TAKE 1 TABLET BY MOUTH EVERY MORNING AND EVENING 12/31/21   [name], MD  denosumab (PROLIA) 60 MG/ML  SOSY injection Inject 60 mg into the skin every 6 (six) months.     [provider]  Fluticasone-Umeclidin-Vilant (TRELEGY ELLIPTA) 200-62.5-25 MCG/ACT AEPB Inhale 1 puff into the lungs daily. 10/16/21   [name], MD  ipratropium-albuterol (DUONEB) 0.5-2.5 (3) MG/3ML SOLN Inhale 3 mLs into the lungs See admin instructions. Use 1 vial via nebulizer daily. May use additional vial if needed at bedtime for shortness of breath. 10/15/21   [name], MD  levothyroxine (SYNTHROID) 50 MCG tablet Take 50 mcg by mouth daily before breakfast.    [provider]  losartan (COZAAR) 50 MG tablet Take 50 mg by mouth daily.    [provider]  mupirocin ointment (BACTROBAN) 2 % Apply a small amount twice daily to affected area for 14 days. 12/20/21   [provider]  OXYGEN Inhale 2 L into the lungs continuous.    [provider]  rosuvastatin (CRESTOR) 5 MG tablet Take 5 mg by mouth daily. 02/01/19   [provider]  VENTOLIN HFA 108 (90 Base) MCG/ACT inhaler INHALE 2 PUFFS BY MOUTH EVERY 4 HOURS AS NEEDED FOR WHEEZE OR FOR SHORTNESS OF BREATH 03/20/22   [name], MD      Allergies  Bee venom, Codeine, Macrobid [nitrofurantoin macrocrystal], Morphine and related, Oxycodone, Penicillins, and Tyloxapol    Review of Systems   Review of Systems  Physical Exam Updated Vital Signs BP 128/62   Pulse 82   Temp 97.7 F (36.5 C) (Oral)   Resp (!) 21   Ht 5' 5"$  (1.651 m)   Wt 63.5 kg   SpO2 93%   BMI 23.30 kg/m  Physical Exam Vitals and nursing note reviewed.  HENT:     Head: Atraumatic.  Eyes:     Pupils: Pupils are equal, round, and reactive to light.  Cardiovascular:     Rate and Rhythm: Regular rhythm.  Abdominal:     Tenderness: There is no abdominal tenderness.  Genitourinary:    Comments: CVA tenderness on right. Neurological:     Mental Status: She is alert and oriented to person, place, and time.     ED Results  / Procedures / Treatments   Labs (all labs ordered are listed, but only abnormal results are displayed) Labs Reviewed  COMPREHENSIVE METABOLIC PANEL - Abnormal; Notable for the following components:      Result Value   Glucose, Bld 105 (*)    GFR, Estimated 59 (*)    All other components within normal limits  CBC WITH DIFFERENTIAL/PLATELET  URINALYSIS, ROUTINE W REFLEX MICROSCOPIC    EKG None  Radiology No results found.  Procedures Procedures  {Document cardiac monitor, telemetry assessment procedure when appropriate:1}  Medications Ordered in ED Medications - No data to display  ED Course/ Medical Decision Making/ A&P   {   Click here for ABCD2, HEART and other calculatorsREFRESH Note before signing :1}                          Medical Decision Making Amount and/or Complexity of Data Reviewed Labs: ordered. Radiology: ordered.   Patient with right flank pain.  Is had for around 5 days now.  Some dysuria.  No fevers.  Differential diagnosis includes UTI, kidney stones.  Basic blood work done and reassuring.  Will get urinalysis.  CT scan done and does show what appears show right-sided kidney stone.  Independently interpreted by me.  {Document critical care time when appropriate:1} {Document review of labs and clinical decision tools ie heart score, Chads2Vasc2 etc:1}  {Document your independent review of radiology images, and any outside records:1} {Document your discussion with family members, caretakers, and with consultants:1} {Document social determinants of health affecting pt's care:1} {Document your decision making why or why not admission, treatments were needed:1} Final Clinical Impression(s) / ED Diagnoses Final diagnoses:  None    Rx / DC Orders ED Discharge Orders     None

## 2022-05-15 NOTE — Discharge Instructions (Addendum)
Return for fevers or uncontrolled pain.

## 2022-05-16 LAB — URINE CULTURE: Culture: NO GROWTH

## 2022-05-24 ENCOUNTER — Ambulatory Visit: Payer: Medicare Other | Admitting: Cardiovascular Disease

## 2022-05-30 ENCOUNTER — Encounter: Payer: Self-pay | Admitting: Radiology

## 2022-06-03 ENCOUNTER — Ambulatory Visit (INDEPENDENT_AMBULATORY_CARE_PROVIDER_SITE_OTHER): Payer: Medicare Other | Admitting: Urology

## 2022-06-03 VITALS — BP 128/77 | HR 84 | Ht 65.0 in | Wt 141.0 lb

## 2022-06-03 DIAGNOSIS — Z87442 Personal history of urinary calculi: Secondary | ICD-10-CM

## 2022-06-03 DIAGNOSIS — N132 Hydronephrosis with renal and ureteral calculous obstruction: Secondary | ICD-10-CM

## 2022-06-03 DIAGNOSIS — N201 Calculus of ureter: Secondary | ICD-10-CM

## 2022-06-03 NOTE — Progress Notes (Signed)
[id number] PM   Elaine [name] Courier [id number]  Referring provider: Quentin Cornwall, MD [place]  No chief complaint on file.   HPI:  New patient-   1) right ureteral stone-patient was seen at emergency with right flank pain.  CT scan revealed mild right hydro down to a 3 mm right UVJ stone.  No renal stones.  Her creatinine was 0.99.  Urine culture was negative.  She hasn't seen a stone pass but did have some urgency and "pressure". She also had loost stools and might not have seen the stone. Drinks mainly water. On abx for diverticulitis.   UA clear today.   No prior history of stone. She is an Optometrist.   PMH: Past Medical History:  Diagnosis Date   Arrhythmia    Breast cancer ([place]) 1995   left    Cataract    Complication of anesthesia    Emphysema of lung ([place])    Family history of breast cancer    Hiatal hernia 1992   Hypertension    Hyperthyroidism    Osteoporosis    PONV (postoperative nausea and vomiting)     Surgical History: Past Surgical History:  Procedure Laterality Date   BRONCHIAL WASHINGS  05/01/2021   Procedure: BRONCHIAL WASHINGS;  Surgeon: [name], MD;  Location: [facility];  Service: Cardiopulmonary;;   CATARACT EXTRACTION     MASTECTOMY Left 1995   MASTECTOMY W/ SENTINEL NODE BIOPSY Right 12/30/2017   MASTECTOMY W/ SENTINEL NODE BIOPSY Right 12/30/2017   Procedure: MASTECTOMY WITH SENTINEL LYMPH NODE BIOPSY;  Surgeon: Rolm Bookbinder, MD;  Location: [facility];  Service: General;  Laterality: Right;   TOTAL ABDOMINAL HYSTERECTOMY  06/22/1993   VIDEO BRONCHOSCOPY N/A 05/01/2021   Procedure: VIDEO BRONCHOSCOPY WITHOUT FLUORO;  Surgeon: [name], MD;  Location: [facility];  Service: Cardiopulmonary;  Laterality: N/A;    Home Medications:  Allergies as of 06/03/2022       Reactions   Bee Venom Other (See Comments)   Unknown   Codeine Nausea Only   Macrobid [nitrofurantoin Macrocrystal]  Other (See Comments)   Unknown   Morphine And Related Other (See Comments)   Unknown   Oxycodone Nausea Only   Penicillins Other (See Comments)   Unknown Has patient had a PCN reaction causing immediate rash, facial/tongue/throat swelling, SOB or lightheadedness with hypotension: Unknown Has patient had a PCN reaction causing severe rash involving mucus membranes or skin necrosis: Unknown Has patient had a PCN reaction that required hospitalization: Unknown Has patient had a PCN reaction occurring within the last 10 years: No If all of the above answers are "NO", then may proceed with Cephalosporin use.   Tyloxapol Nausea Only, Other (See Comments)   Extreme Drugged feeling, Doesn't affect pain        Medication List        Accurate as of June 03, 2022  2:56 PM. If you have any questions, ask your nurse or doctor.          aspirin EC 81 MG tablet Take 81 mg by mouth daily. Swallow whole.   azithromycin 250 MG tablet Commonly known as: ZITHROMAX Take 1 tablet (250 mg total) by mouth every Monday, Wednesday, and Friday.   buPROPion 150 MG 12 hr tablet Commonly known as: WELLBUTRIN SR TAKE 1 TABLET BY MOUTH EVERY MORNING FOR 3 DAYS THEN AFTER THAT TAKE 1 TABLET BY MOUTH EVERY MORNING AND EVENING   ciprofloxacin 500  MG tablet Commonly known as: CIPRO Take 1 tablet (500 mg total) by mouth every 12 (twelve) hours.   denosumab 60 MG/ML Sosy injection Commonly known as: PROLIA Inject 60 mg into the skin every 6 (six) months.   HYDROcodone-acetaminophen 5-325 MG tablet Commonly known as: NORCO/VICODIN Take 1-2 tablets by mouth every 6 (six) hours as needed.   ipratropium-albuterol 0.5-2.5 (3) MG/3ML Soln Commonly known as: DUONEB Inhale 3 mLs into the lungs See admin instructions. Use 1 vial via nebulizer daily. May use additional vial if needed at bedtime for shortness of breath.   levothyroxine 50 MCG tablet Commonly known as: SYNTHROID Take 50 mcg by mouth every  other day.   Synthroid 75 MCG tablet Generic drug: levothyroxine Take 75 mcg by mouth every other day.   losartan 50 MG tablet Commonly known as: COZAAR Take 50 mg by mouth daily.   metroNIDAZOLE 500 MG tablet Commonly known as: FLAGYL Take 1 tablet (500 mg total) by mouth 2 (two) times daily.   ondansetron 4 MG disintegrating tablet Commonly known as: ZOFRAN-ODT Take 1 tablet (4 mg total) by mouth every 8 (eight) hours as needed for nausea or vomiting.   OXYGEN Inhale 2 L into the lungs continuous.   rosuvastatin 5 MG tablet Commonly known as: CRESTOR Take 5 mg by mouth daily.   Trelegy Ellipta 200-62.5-25 MCG/ACT Aepb Generic drug: Fluticasone-Umeclidin-Vilant Inhale 1 puff into the lungs daily.   Ventolin HFA 108 (90 Base) MCG/ACT inhaler Generic drug: albuterol INHALE 2 PUFFS BY MOUTH EVERY 4 HOURS AS NEEDED FOR WHEEZE OR FOR SHORTNESS OF BREATH        Allergies:  Allergies  Allergen Reactions   Bee Venom Other (See Comments)    Unknown   Codeine Nausea Only   Macrobid [Nitrofurantoin Macrocrystal] Other (See Comments)    Unknown   Morphine And Related Other (See Comments)    Unknown   Oxycodone Nausea Only   Penicillins Other (See Comments)    Unknown Has patient had a PCN reaction causing immediate rash, facial/tongue/throat swelling, SOB or lightheadedness with hypotension: Unknown Has patient had a PCN reaction causing severe rash involving mucus membranes or skin necrosis: Unknown Has patient had a PCN reaction that required hospitalization: Unknown Has patient had a PCN reaction occurring within the last 10 years: No If all of the above answers are "NO", then may proceed with Cephalosporin use.    Tyloxapol Nausea Only and Other (See Comments)    Extreme Drugged feeling, Doesn't affect pain    Family History: Family History  Problem Relation Age of Onset   Aneurysm Mother    Heart attack Father    Diabetes Sister    Diabetes Sister     Breast cancer Cousin 33   Breast cancer Cousin        20's/30's   Breast cancer Cousin        3 of Mcousins1xremoved (mom's cousins) had breast cancer 70+    Social History:  reports that she has been smoking cigarettes. She has a 50.00 pack-year smoking history. She has never used smokeless tobacco. She reports current alcohol use. She reports that she does not use drugs.   Physical Exam: BP 128/77   Pulse 84   Ht '5\' 5"'$  (1.651 m)   Wt 141 lb (64 kg)   BMI 23.46 kg/m   Constitutional:  Alert and oriented, No acute distress. HEENT: [place] AT, moist mucus membranes.  Trachea midline, no masses. Cardiovascular: No clubbing, cyanosis, or edema.  Respiratory: Normal respiratory effort, no increased work of breathing. GI: Abdomen is soft, nontender, nondistended, no abdominal masses GU: No CVA tenderness Skin: No rashes, bruises or suspicious lesions. Neurologic: Grossly intact, no focal deficits, moving all 4 extremities. Psychiatric: Normal mood and affect.  Laboratory Data: Lab Results  Component Value Date   WBC 6.8 05/15/2022   HGB 14.8 05/15/2022   HCT 43.6 05/15/2022   MCV 93.6 05/15/2022   PLT 217 05/15/2022    Lab Results  Component Value Date   CREATININE 0.99 05/15/2022    Urinalysis    Component Value Date/Time   COLORURINE YELLOW 05/15/2022 1210   APPEARANCEUR HAZY (A) 05/15/2022 1210   LABSPEC 1.026 05/15/2022 1210   PHURINE 5.0 05/15/2022 1210   GLUCOSEU NEGATIVE 05/15/2022 1210   HGBUR NEGATIVE 05/15/2022 1210   BILIRUBINUR NEGATIVE 05/15/2022 1210   KETONESUR 5 (A) 05/15/2022 1210   PROTEINUR NEGATIVE 05/15/2022 1210   NITRITE NEGATIVE 05/15/2022 1210   LEUKOCYTESUR TRACE (A) 05/15/2022 1210    Lab Results  Component Value Date   BACTERIA RARE (A) 05/15/2022    Pertinent Imaging:  Results for orders placed during the hospital encounter of 05/15/22  CT Renal Stone Study  Narrative CLINICAL DATA:  Abdominal/flank pain, stone suspected.  Difficulty urinating and right-sided flank pain x5 days.  EXAM: CT ABDOMEN AND PELVIS WITHOUT CONTRAST  TECHNIQUE: Multidetector CT imaging of the abdomen and pelvis was performed following the standard protocol without IV contrast.  RADIATION DOSE REDUCTION: This exam was performed according to the departmental dose-optimization program which includes automated exposure control, adjustment of the mA and/or kV according to patient size and/or use of iterative reconstruction technique.  COMPARISON:  None Available.  FINDINGS: Lower chest: Emphysema and scarring in the lung bases. Coronary artery calcifications.  Hepatobiliary: No focal liver abnormality is seen. No gallstones, gallbladder wall thickening, or biliary dilatation.  Pancreas: Atrophic pancreas.  No surrounding inflammatory changes.  Spleen: Normal in size without focal abnormality.  Adrenals/Urinary Tract: Unremarkable adrenals. Mild right hydroureteronephrosis with associated mild perinephric stranding. 3 mm obstructing stone at the right ureterovesicular junction. Bladder is otherwise unremarkable.  Stomach/Bowel: Small hiatal hernia. No dilated loops of small bowel. Appendix is not visualized. Severe pancolonic diverticulosis. Moderate inflammatory fat stranding and wall thickening along the descending-sigmoid junction, consistent with acute diverticulitis.  Vascular/Lymphatic: Atherosclerotic calcifications of the abdominal aorta and its branches. No abdominal or pelvic lymphadenopathy.  Reproductive: No adnexal masses.  Other: No abdominal wall hernia or abnormality. No abdominopelvic ascites.  Musculoskeletal: Chronic appearing moderate anterior compression deformity of the T12 vertebral body.  IMPRESSION: 1. Mild right hydroureteronephrosis with a 3 mm obstructing stone at the right ureterovesicular junction. 2. Acute uncomplicated diverticulitis at the descending-sigmoid junction. 3. Chronic  appearing moderate anterior compression deformity of the T12 vertebral body.  Aortic Atherosclerosis (ICD10-I70.0) and Emphysema (ICD10-J43.9).   Electronically Signed By: Emmit Alexanders M.D. On: 05/15/2022 13:39   Assessment & Plan:    1.  Right ureteral stone, right hydronephrosis - likely passed. UA clear. Check renal US to ensure right hydro resolved.  - Urinalysis, Routine w reflex microscopic   No follow-ups on file.  Festus Aloe, MD  [facility]  [address] [place], [place] [address] [phone]

## 2022-06-04 LAB — URINALYSIS, ROUTINE W REFLEX MICROSCOPIC
Bilirubin, UA: NEGATIVE
Glucose, UA: NEGATIVE
Ketones, UA: NEGATIVE
Leukocytes,UA: NEGATIVE
Nitrite, UA: NEGATIVE
Protein,UA: NEGATIVE
RBC, UA: NEGATIVE
Specific Gravity, UA: 1.01 (ref 1.005–1.030)
Urobilinogen, Ur: 0.2 mg/dL (ref 0.2–1.0)
pH, UA: 5.5 (ref 5.0–7.5)

## 2022-06-10 ENCOUNTER — Encounter (HOSPITAL_BASED_OUTPATIENT_CLINIC_OR_DEPARTMENT_OTHER): Payer: Self-pay | Admitting: Pulmonary Disease

## 2022-06-10 ENCOUNTER — Ambulatory Visit (INDEPENDENT_AMBULATORY_CARE_PROVIDER_SITE_OTHER): Payer: Medicare Other | Admitting: Pulmonary Disease

## 2022-06-10 VITALS — BP 120/80 | HR 86 | Ht 63.0 in | Wt 142.6 lb

## 2022-06-10 DIAGNOSIS — J432 Centrilobular emphysema: Secondary | ICD-10-CM | POA: Diagnosis not present

## 2022-06-10 DIAGNOSIS — G4734 Idiopathic sleep related nonobstructive alveolar hypoventilation: Secondary | ICD-10-CM

## 2022-06-10 MED ORDER — AZITHROMYCIN 250 MG PO TABS: 250.0000 mg | ORAL_TABLET | ORAL | 3 refills | Status: DC

## 2022-06-10 MED ORDER — TRELEGY ELLIPTA 200-62.5-25 MCG/ACT IN AEPB: 1.0000 | INHALATION_SPRAY | Freq: Every day | RESPIRATORY_TRACT | 3 refills | Status: DC

## 2022-06-10 NOTE — Patient Instructions (Signed)
GOLD Class C  Nocturnal hypoxemia --CONTINUE Trelegy 200 ONE puff ONCE a day. REFILLED 1 year --CONTINUE chronic macrolide. Azithromycin M-W-F. REFILLED 1 year --ORDER ONO on room air --Discussed vaccinations including influenza, COVID, RSV and pneumococcal   Nebulizers - Use twice a day --Duoneb PREFERRED however this can cause palpitations  Tobacco abuse Patient is an active smoker  Follow-up with me in 6 months

## 2022-06-10 NOTE — Progress Notes (Signed)
Subjective:   PATIENT ID: Elaine [name] GENDER: female DOB: 1945/08/31, MRN: [id number]   HPI  No chief complaint on file.  Ms. Keela Pluth is a 77 year-old female with hx right breast cancer s/p mastectomy who presents for COPD follow-up  2019 - Established care with [facility] in November. On Anoro and chronic macrolide 2020 - Outpatient exacerbation Dec 2021 - Urgent care for COPD exacerbation in July 2022 - Hospitalized for COPD exacerbation in June 2023 - PNA followed by recurrent hemoptysis > bronchoscopy 05/01/21 demonstrated old blood clot occluding subsegment of left upper lobe which was aspirated. Outpatient COPD exacerbation in Feb and April and May. Hospitalized in July.  08/02/21 She reports recent exacerbation in April with improvement on prednisone. Still some mild productive cough and shortness of breath on exertion.Denies wheezing, fevers, chills. Her oxygen is 92% on room air. Usually wears O2 at night unless she is having an exacerbation.   She reports when she has difficulty breathing, her apple watch shows atrial fibrillation which improves to sinus rhythm at rest. Denies chest pain, dizziness. This has been occurring since Feb/March and wishes to see a Cardiologist at [facility].  10/15/21 Since our last visit she was hospitalized from 7/10-7/11 for COPD exacerbation. Discharge summary reviewed from [facility] in [place]. CTA 10/08/21 negative for PE and no acute findings. Discharged on nicotine patch and lozenges and prednisone pack. Since discharge she reports severe nasal congestion and has more drainage. She has continued on a nicotine patch and lozenges, only smoking 1-2 cigarettes a day at most. Has been taking Trelegy and feels this is working well for her. Denies cough, shortness of breath or wheezing at this time.  01/01/22 Since our last visit on 9/7 had a minor nose bleed followed by 9/8 major nosebleed. Discontinued eliquis after discussing with Cardiology and  scheduled for ENT in [place]. ENT exam with no obvious source of bleeding. Has not started the wellbutrin. Currently smoking up to 1/4 ppd on some days. Denies shortness of breath, cough or wheezing. Compliant with Trelegy and nebulizer daily. Compliant with her oxygen at night.  06/10/22 Since our last visit she reports her COPD is overall well controlled. Has been avoiding albuterol handheld due to her heart issues but using Duonebs 1-2 times daily as needed without any issues. Compliant with Trelegy and chronic macrolide. O2 ranging 92-95% with ambulation. Off oxygen. Continues smoking 1/2 ppd. No plans to quit. Not on wellbutrin.  Social: 50 pack year history. Quit smoking 01/2018. Currently smoking  Past Medical History:  Diagnosis Date   Arrhythmia    Breast cancer ([place]) 1995   left    Cataract    Complication of anesthesia    Emphysema of lung ([place])    Family history of breast cancer    Hiatal hernia 1992   Hypertension    Hyperthyroidism    Osteoporosis    PONV (postoperative nausea and vomiting)       Social History   Occupational History   Occupation: Optometrist  Tobacco Use   Smoking status: Every Day    Packs/day: 1.00    Years: 50.00    Total pack years: 50.00    Types: Cigarettes   Smokeless tobacco: Never   Tobacco comments:    2 cigarettes per day-- ARJ 10/15/21  Vaping Use   Vaping Use: Never used  Substance and Sexual Activity   Alcohol use: Yes    Comment: Martini daily   Drug use: Never  Sexual activity: Not on file    Allergies  Allergen Reactions   Bee Venom Other (See Comments)    Unknown   Codeine Nausea Only   Macrobid [Nitrofurantoin Macrocrystal] Other (See Comments)    Unknown   Morphine And Related Other (See Comments)    Unknown   Oxycodone Nausea Only   Penicillins Other (See Comments)    Unknown Has patient had a PCN reaction causing immediate rash, facial/tongue/throat swelling, SOB or lightheadedness with hypotension:  Unknown Has patient had a PCN reaction causing severe rash involving mucus membranes or skin necrosis: Unknown Has patient had a PCN reaction that required hospitalization: Unknown Has patient had a PCN reaction occurring within the last 10 years: No If all of the above answers are "NO", then may proceed with Cephalosporin use.    Tyloxapol Nausea Only and Other (See Comments)    Extreme Drugged feeling, Doesn't affect pain     Outpatient Medications Prior to Visit  Medication Sig Dispense Refill   aspirin 81 MG EC tablet Take 81 mg by mouth daily. Swallow whole.     denosumab (PROLIA) 60 MG/ML SOSY injection Inject 60 mg into the skin every 6 (six) months.      ipratropium-albuterol (DUONEB) 0.5-2.5 (3) MG/3ML SOLN Inhale 3 mLs into the lungs See admin instructions. Use 1 vial via nebulizer daily. May use additional vial if needed at bedtime for shortness of breath. 360 mL 3   levothyroxine (SYNTHROID) 50 MCG tablet Take 50 mcg by mouth every other day.     losartan (COZAAR) 50 MG tablet Take 50 mg by mouth daily.     metroNIDAZOLE (FLAGYL) 500 MG tablet Take 1 tablet (500 mg total) by mouth 2 (two) times daily. 14 tablet 0   OXYGEN Inhale 2 L into the lungs continuous.     rosuvastatin (CRESTOR) 5 MG tablet Take 5 mg by mouth daily.     SYNTHROID 75 MCG tablet Take 75 mcg by mouth every other day.     VENTOLIN HFA 108 (90 Base) MCG/ACT inhaler INHALE 2 PUFFS BY MOUTH EVERY 4 HOURS AS NEEDED FOR WHEEZE OR FOR SHORTNESS OF BREATH 54 each 1   azithromycin (ZITHROMAX) 250 MG tablet Take 1 tablet (250 mg total) by mouth every Monday, Wednesday, and Friday. 39 tablet 3   Fluticasone-Umeclidin-Vilant (TRELEGY ELLIPTA) 200-62.5-25 MCG/ACT AEPB Inhale 1 puff into the lungs daily. 60 each 5   HYDROcodone-acetaminophen (NORCO/VICODIN) 5-325 MG tablet Take 1-2 tablets by mouth every 6 (six) hours as needed. (Patient not taking: Reported on 06/10/2022) 6 tablet 0   buPROPion (WELLBUTRIN SR) 150 MG 12 hr  tablet TAKE 1 TABLET BY MOUTH EVERY MORNING FOR 3 DAYS THEN AFTER THAT TAKE 1 TABLET BY MOUTH EVERY MORNING AND EVENING (Patient not taking: Reported on 06/10/2022) 180 tablet 1   ciprofloxacin (CIPRO) 500 MG tablet Take 1 tablet (500 mg total) by mouth every 12 (twelve) hours. 14 tablet 0   ondansetron (ZOFRAN-ODT) 4 MG disintegrating tablet Take 1 tablet (4 mg total) by mouth every 8 (eight) hours as needed for nausea or vomiting. (Patient not taking: Reported on 06/10/2022) 8 tablet 0   No facility-administered medications prior to visit.    Review of Systems  Constitutional:  Negative for chills, diaphoresis, fever, malaise/fatigue and weight loss.  HENT:  Negative for congestion.   Respiratory:  Positive for shortness of breath. Negative for cough, hemoptysis, sputum production and wheezing.   Cardiovascular:  Negative for chest pain, palpitations and leg  swelling.   Objective:    Vitals:   06/10/22 1309  BP: 120/80  Pulse: 86  SpO2: 96%  Weight: 142 lb 9.6 oz (64.7 kg)  Height: '5\' 3"'$  (1.6 m)   SpO2: 96 % O2 Device: None (Room air)  Physical Exam: General: Well-appearing, no acute distress HENT: [employer], AT Eyes: EOMI, no scleral icterus Respiratory: Diminished but clear to auscultation bilaterally.  No crackles, wheezing or rales Cardiovascular: RRR, -M/R/G, no JVD Extremities:-Edema,-tenderness Neuro: AAO x4, CNII-XII grossly intact Psych: Normal mood, normal affect  Data Reviewed  Chest imaging: CT Chest 07/24/17 - Severe emphysema. RML groundglass nodule 22m CT Chest 03/09/18 - Severe emphysema. Interval resolution of ground glass nodule CT Chest 05/05/20 - Emphysema present. 4.7 mm noncalcified nodule. CTA 04/17/21 - No PE. Emphysema. LUL patchy infiltrate. 5 mm LLL groundglass nodule which is new  PFT:  09/27/18 FVC 3.13 (120%) FEV1 1.84 (99%) Ratio 71  TLC 92% DLCO 42% Interpretation: Normal spirometry  Labs: Absolute Eosinophils  11/06/2017: 300 04/28/18:  200 04/25/21: 300  Assessment & Plan:  77year old female active smoker with COPD with emphysema, history right breast cancer, osteoporosis who presents for follow-up.  Symptoms well-controlled on Trelegy, chronic macrolide and daily DuoNebs.  Has not been using oxygen.  Counseled on compliance and risk off of oxygenation.  Recommend using oxygen if SpO2 is less than 88%.  Reasonable to reevaluate if she still needs nocturnal oxygen. Discussed clinical course and management of COPD/asthma including bronchodilator regimen, preventive care including vaccinations and action plan for exacerbation (prefers low-dose prednisone close).  GOLD Class C  Nocturnal hypoxemia --CONTINUE Trelegy 200 ONE puff ONCE a day. REFILLED 1 year --CONTINUE chronic macrolide. Azithromycin M-W-F. REFILLED 1 year --ORDER ONO on room air. [place] [facility] [place] --Discussed vaccinations including influenza, COVID, RSV and pneumococcal   Nebulizers - Use twice a day --Duoneb PREFERRED however this can cause palpitations  Tobacco abuse Patient is an active smoker   Health Maintenance Immunization History  Administered Date(s) Administered   Influenza Inj Mdck Quad Pf 01/23/2018   Influenza, High Dose Seasonal PF 01/18/2021   Influenza-Unspecified 01/13/2015, 02/08/2016, 01/16/2017, 01/28/2018   Janssen (J&J) SARS-COV-2 Vaccination 07/01/2019, 02/12/2020   Pneumococcal Polysaccharide-23 08/10/2016   Pneumococcal-Unspecified 08/02/2014   Tdap 08/02/2014   CT Lung Screen - Enrolled. Due 04/2022  Orders Placed This Encounter  Procedures   Pulse oximetry, overnight    [place] [facility] [place]    Standing Status:   Future    Standing Expiration Date:   06/10/2023    Scheduling Instructions:     [place] [facility] [place]    Perform on room air   Meds ordered this encounter  Medications    Fluticasone-Umeclidin-Vilant (TRELEGY ELLIPTA) 200-62.5-25 MCG/ACT AEPB    Sig: Inhale 1 puff into the lungs daily.    Dispense:  180 each    Refill:  3    Patietn wants a 3 month supply please   azithromycin (ZITHROMAX) 250 MG tablet    Sig: Take 1 tablet (250 mg total) by mouth every Monday, Wednesday, and Friday.    Dispense:  39 tablet    Refill:  3    Return in about 6 months (around 12/11/2022).  I have spent a total time of 36-minutes on the day of the appointment including chart review, data review, collecting history, coordinating care and discussing medical diagnosis and plan with  the patient/family. Past medical history, allergies, medications were reviewed. Pertinent imaging, labs and tests included in this note have been reviewed and interpreted independently by me.  Sequoyah Counterman Rodman Pickle, MD [facility] 06/10/2022 2:00 PM

## 2022-06-11 ENCOUNTER — Encounter (HOSPITAL_BASED_OUTPATIENT_CLINIC_OR_DEPARTMENT_OTHER): Payer: Self-pay | Admitting: Pulmonary Disease

## 2022-06-11 ENCOUNTER — Ambulatory Visit (HOSPITAL_COMMUNITY)
Admission: RE | Admit: 2022-06-11 | Discharge: 2022-06-11 | Disposition: A | Discharge status: Home / Self Care | Payer: Medicare Other | Source: Ambulatory Visit | Attending: Urology | Admitting: Urology

## 2022-06-11 DIAGNOSIS — N201 Calculus of ureter: Secondary | ICD-10-CM | POA: Insufficient documentation

## 2022-06-11 DIAGNOSIS — N132 Hydronephrosis with renal and ureteral calculous obstruction: Secondary | ICD-10-CM | POA: Diagnosis present

## 2022-06-12 NOTE — Telephone Encounter (Signed)
Mychart message sent by pt:  Elaine [name]  P [facility] Clinical Pool (supporting Chi Rodman Pickle, MD)14 hours ago (7:11 PM)    If test are in normal range, will they take the oxygen equipment that I have now? I would like to keep with spring coming and my flare ups are in  June or July. Thank you Elaine [name] DOB [phone]     Dr. Loanne Drilling, please advise.

## 2022-06-16 ENCOUNTER — Encounter: Payer: Self-pay | Admitting: Urology

## 2022-06-19 NOTE — Telephone Encounter (Signed)
Please request ONO results on 06/20/22.

## 2022-06-20 ENCOUNTER — Encounter (HOSPITAL_BASED_OUTPATIENT_CLINIC_OR_DEPARTMENT_OTHER): Payer: Self-pay | Admitting: Pulmonary Disease

## 2022-06-20 DIAGNOSIS — G4734 Idiopathic sleep related nonobstructive alveolar hypoventilation: Secondary | ICD-10-CM

## 2022-06-20 NOTE — Telephone Encounter (Signed)
Please check to see if results have been faxed to the office for me to sign

## 2022-06-20 NOTE — Telephone Encounter (Signed)
Quinn Axe of paperwork was brought over by [employer] from [facility] office today 3/21. Dr. Loanne Drilling, please advise if results were in that stack of papers.

## 2022-06-20 NOTE — Telephone Encounter (Signed)
Mychart message sent by pt:  Elaine [name]  P [facility] Clinical Pool (supporting Chi Rodman Pickle, MD)46 minutes ago (1:39 PM)    Just talked to [facility] and they couldn't release test results until they had a signed order from you. She said that they have faxed you the order to sign and once they receive it back they will release the results! Typical [place]! [name] DOB [phone]     Routing to Dr. Loanne Drilling for review.

## 2022-06-20 NOTE — Telephone Encounter (Signed)
No, all records reviewed from [employer]. No ONO report for Ms. Dieter.  Please obtain ONO results.

## 2022-06-24 NOTE — Telephone Encounter (Signed)
Checked Dr. Cordelia Pen folders at [facility] and did not see anything for this patient. Will call [facility] tomorrow to see if they can refax the order.

## 2022-06-25 NOTE — Telephone Encounter (Signed)
Order for nocturnal oxygen has been placed.

## 2022-06-25 NOTE — Telephone Encounter (Signed)
[facility] Telephone Encounter  ONO 06/17/22 SpO2 <88% 1:37:35, Nadir SpO2 80% Interpretation: Qualified for oxygen  Please recertify patient for nocturnal oxygen 2L nightly Patient has been called and expresses understanding and agrees to plan

## 2022-07-11 ENCOUNTER — Encounter (HOSPITAL_BASED_OUTPATIENT_CLINIC_OR_DEPARTMENT_OTHER): Payer: Self-pay | Admitting: Pulmonary Disease

## 2022-07-12 NOTE — Telephone Encounter (Signed)
Do you want patient to keep her upcoming apt?  Please see below.  Thanks

## 2022-07-18 MED ORDER — PREDNISONE 10 MG PO TABS: ORAL_TABLET | ORAL | 0 refills | Status: DC

## 2022-07-18 NOTE — Telephone Encounter (Signed)
Dr. Everardo All, please advise. There are no openings with APPs for the rest of the week. Only openings are tomorrow with RB or VS.

## 2022-07-19 ENCOUNTER — Emergency Department (HOSPITAL_COMMUNITY): Payer: Medicare Other

## 2022-07-19 ENCOUNTER — Encounter (HOSPITAL_COMMUNITY): Payer: Self-pay | Admitting: *Deleted

## 2022-07-19 ENCOUNTER — Inpatient Hospital Stay (HOSPITAL_COMMUNITY)
Admission: EM | Admit: 2022-07-19 | Discharge: 2022-07-21 | DRG: 190 | Disposition: A | Discharge status: Home / Self Care | Payer: Medicare Other | Attending: Internal Medicine | Admitting: Internal Medicine

## 2022-07-19 ENCOUNTER — Other Ambulatory Visit: Payer: Self-pay

## 2022-07-19 DIAGNOSIS — E782 Mixed hyperlipidemia: Secondary | ICD-10-CM | POA: Diagnosis present

## 2022-07-19 DIAGNOSIS — Z79899 Other long term (current) drug therapy: Secondary | ICD-10-CM

## 2022-07-19 DIAGNOSIS — Z8249 Family history of ischemic heart disease and other diseases of the circulatory system: Secondary | ICD-10-CM

## 2022-07-19 DIAGNOSIS — R7989 Other specified abnormal findings of blood chemistry: Secondary | ICD-10-CM | POA: Diagnosis not present

## 2022-07-19 DIAGNOSIS — Z7982 Long term (current) use of aspirin: Secondary | ICD-10-CM

## 2022-07-19 DIAGNOSIS — R911 Solitary pulmonary nodule: Secondary | ICD-10-CM | POA: Diagnosis present

## 2022-07-19 DIAGNOSIS — J9601 Acute respiratory failure with hypoxia: Secondary | ICD-10-CM | POA: Diagnosis present

## 2022-07-19 DIAGNOSIS — Z880 Allergy status to penicillin: Secondary | ICD-10-CM

## 2022-07-19 DIAGNOSIS — Z853 Personal history of malignant neoplasm of breast: Secondary | ICD-10-CM

## 2022-07-19 DIAGNOSIS — J441 Chronic obstructive pulmonary disease with (acute) exacerbation: Principal | ICD-10-CM

## 2022-07-19 DIAGNOSIS — I10 Essential (primary) hypertension: Secondary | ICD-10-CM | POA: Diagnosis present

## 2022-07-19 DIAGNOSIS — R008 Other abnormalities of heart beat: Secondary | ICD-10-CM | POA: Diagnosis present

## 2022-07-19 DIAGNOSIS — F17210 Nicotine dependence, cigarettes, uncomplicated: Secondary | ICD-10-CM | POA: Diagnosis present

## 2022-07-19 DIAGNOSIS — Z803 Family history of malignant neoplasm of breast: Secondary | ICD-10-CM

## 2022-07-19 DIAGNOSIS — I2489 Other forms of acute ischemic heart disease: Secondary | ICD-10-CM | POA: Diagnosis present

## 2022-07-19 DIAGNOSIS — Z9012 Acquired absence of left breast and nipple: Secondary | ICD-10-CM

## 2022-07-19 DIAGNOSIS — J9621 Acute and chronic respiratory failure with hypoxia: Secondary | ICD-10-CM | POA: Diagnosis present

## 2022-07-19 DIAGNOSIS — Z885 Allergy status to narcotic agent status: Secondary | ICD-10-CM

## 2022-07-19 DIAGNOSIS — Z833 Family history of diabetes mellitus: Secondary | ICD-10-CM

## 2022-07-19 DIAGNOSIS — E039 Hypothyroidism, unspecified: Secondary | ICD-10-CM | POA: Diagnosis present

## 2022-07-19 DIAGNOSIS — F419 Anxiety disorder, unspecified: Secondary | ICD-10-CM | POA: Diagnosis present

## 2022-07-19 DIAGNOSIS — J439 Emphysema, unspecified: Secondary | ICD-10-CM | POA: Diagnosis present

## 2022-07-19 DIAGNOSIS — Z888 Allergy status to other drugs, medicaments and biological substances status: Secondary | ICD-10-CM

## 2022-07-19 DIAGNOSIS — Z91030 Bee allergy status: Secondary | ICD-10-CM

## 2022-07-19 DIAGNOSIS — Z79890 Hormone replacement therapy (postmenopausal): Secondary | ICD-10-CM

## 2022-07-19 LAB — COMPREHENSIVE METABOLIC PANEL
ALT: 17 U/L (ref 0–44)
AST: 21 U/L (ref 15–41)
Albumin: 4.4 g/dL (ref 3.5–5.0)
Alkaline Phosphatase: 63 U/L (ref 38–126)
Anion gap: 9 (ref 5–15)
BUN: 18 mg/dL (ref 8–23)
CO2: 26 mmol/L (ref 22–32)
Calcium: 9.5 mg/dL (ref 8.9–10.3)
Chloride: 104 mmol/L (ref 98–111)
Creatinine, Ser: 0.74 mg/dL (ref 0.44–1.00)
GFR, Estimated: >60 mL/min (ref >60)
Glucose, Bld: 138 mg/dL — ABNORMAL HIGH (ref 70–99)
Potassium: 4.7 mmol/L (ref 3.5–5.1)
Sodium: 139 mmol/L (ref 135–145)
Total Bilirubin: 0.7 mg/dL (ref 0.3–1.2)
Total Protein: 7.2 g/dL (ref 6.5–8.1)

## 2022-07-19 LAB — TROPONIN I (HIGH SENSITIVITY)
Troponin I (High Sensitivity): 35 ng/L — ABNORMAL HIGH (ref <18)
Troponin I (High Sensitivity): 42 ng/L — ABNORMAL HIGH (ref <18)

## 2022-07-19 LAB — CBC WITH DIFFERENTIAL/PLATELET
Abs Immature Granulocytes: 0.05 10*3/uL (ref 0.00–0.07)
Basophils Absolute: 0 10*3/uL (ref 0.0–0.1)
Basophils Relative: 0 %
Eosinophils Absolute: 0 10*3/uL (ref 0.0–0.5)
Eosinophils Relative: 0 %
HCT: 46.4 % — ABNORMAL HIGH (ref 36.0–46.0)
Hemoglobin: 15.9 g/dL — ABNORMAL HIGH (ref 12.0–15.0)
Immature Granulocytes: 1 %
Lymphocytes Relative: 9 %
Lymphs Abs: 0.8 10*3/uL (ref 0.7–4.0)
MCH: 31.9 pg (ref 26.0–34.0)
MCHC: 34.3 g/dL (ref 30.0–36.0)
MCV: 93 fL (ref 80.0–100.0)
Monocytes Absolute: 0.1 10*3/uL (ref 0.1–1.0)
Monocytes Relative: 1 %
Neutro Abs: 8.3 10*3/uL — ABNORMAL HIGH (ref 1.7–7.7)
Neutrophils Relative %: 89 %
Platelets: 267 10*3/uL (ref 150–400)
RBC: 4.99 MIL/uL (ref 3.87–5.11)
RDW: 13.2 % (ref 11.5–15.5)
WBC: 9.3 10*3/uL (ref 4.0–10.5)
nRBC: 0 % (ref 0.0–0.2)

## 2022-07-19 MED ORDER — METHYLPREDNISOLONE SODIUM SUCC 125 MG IJ SOLR
125.0000 mg | Freq: Once | INTRAMUSCULAR | Status: AC
  Administered 2022-07-19: 125 mg via INTRAVENOUS
  Filled 2022-07-19: qty 2

## 2022-07-19 MED ORDER — ALBUTEROL (5 MG/ML) CONTINUOUS INHALATION SOLN
10.0000 mg | INHALATION_SOLUTION | RESPIRATORY_TRACT | Status: AC
  Filled 2022-07-19: qty 5

## 2022-07-19 MED ORDER — ALBUTEROL SULFATE (2.5 MG/3ML) 0.083% IN NEBU
INHALATION_SOLUTION | RESPIRATORY_TRACT | Status: AC
  Administered 2022-07-19: 10 mg
  Filled 2022-07-19: qty 12

## 2022-07-19 NOTE — ED Provider Notes (Signed)
[place] [facility]ERGENC[facility] DEPARTMENT AT [facility] Provider Note   CSN: [id number] Arrival date & time: 07/19/22  1459     History {Add pertinent medical, surgical, social history, OB history to HPI:1} Chief Complaint  Patient presents with   Shortness of Breath    LYNNEA VANDERVOORT is a 77 y.o. female.   Shortness of Breath  This patient is a very pleasant 77 year old female, she is currently taking Zithromax and prednisone as well as DuoNebs occasionally for her shortness of breath which she gets with her COPD.  She also takes Trelegy in the morning, she follows with pulmonary Dr. Revonda Standard who has put her on oxygen at night, she was seen most recently in March 2024 for nocturnal hypoxemia.  She presents to the hospital today with a complaint of increasing shortness of breath which has been going on for couple of days but dramatically worse in the last 24 hours.  She also notes that when she tries to ambulate her heart rate will drop off sometimes down to around 30.  She is detecting this through a pulse oximeter on her finger which reads oxygen and heart rate.  Incidentally she states the oxygen does not drop below 90 but the heart rate will drop to 30 and she becomes extremely symptomatic with shortness of breath.  She denies fevers chills nausea vomiting or swelling of the legs.  She denies prior cardiac disease and in fact after review of the medical record it is evident that she had a stress test and echocardiogram 1 year ago neither of which showed any ischemic problems.    Home Medications Prior to Admission medications   Medication Sig Start Date End Date Taking? Authorizing Provider  aspirin 81 MG EC tablet Take 81 mg by mouth daily. Swallow whole.    [provider]  azithromycin (ZITHROMAX) 250 MG tablet Take 1 tablet (250 mg total) by mouth every Monday, Wednesday, and Friday. 06/10/22   Luciano Cutter, MD  denosumab (PROLIA) 60 MG/ML SOSY injection Inject 60  mg into the skin every 6 (six) months.     [provider]  Fluticasone-Umeclidin-Vilant (TRELEGY ELLIPTA) 200-62.5-25 MCG/ACT AEPB Inhale 1 puff into the lungs daily. 06/10/22   Luciano Cutter, MD  HYDROcodone-acetaminophen (NORCO/VICODIN) 5-325 MG tablet Take 1-2 tablets by mouth every 6 (six) hours as needed. Patient not taking: Reported on 06/10/2022 05/15/22   Benjiman Core, MD  ipratropium-albuterol (DUONEB) 0.5-2.5 (3) MG/3ML SOLN Inhale 3 mLs into the lungs See admin instructions. Use 1 vial via nebulizer daily. May use additional vial if needed at bedtime for shortness of breath. 10/15/21   Luciano Cutter, MD  levothyroxine (SYNTHROID) 50 MCG tablet Take 50 mcg by mouth every other day.    [provider]  losartan (COZAAR) 50 MG tablet Take 50 mg by mouth daily.    [provider]  metroNIDAZOLE (FLAGYL) 500 MG tablet Take 1 tablet (500 mg total) by mouth 2 (two) times daily. 05/15/22   Benjiman Core, MD  OXYGEN Inhale 2 L into the lungs continuous.    [provider]  predniSONE (DELTASONE) 10 MG tablet Take 4 tablets (40 mg total) by mouth daily with breakfast for 2 days, THEN 3 tablets (30 mg total) daily with breakfast for 2 days, THEN 2 tablets (20 mg total) daily with breakfast for 2 days, THEN 1 tablet (10 mg total) daily with breakfast for 2 days. 07/18/22 07/25/22  Luciano Cutter, MD  rosuvastatin (CRESTOR)  5 MG tablet Take 5 mg by mouth daily. 02/01/19   [provider]  SYNTHROID 75 MCG tablet Take 75 mcg by mouth every other day.    [provider]  VENTOLIN HFA 108 (90 Base) MCG/ACT inhaler INHALE 2 PUFFS BY MOUTH EVERY 4 HOURS AS NEEDED FOR WHEEZE OR FOR SHORTNESS OF BREATH 03/20/22   Luciano Cutter, MD      Allergies    Bee venom, Codeine, Macrobid [nitrofurantoin macrocrystal], Morphine and related, Oxycodone, Penicillins, and Tyloxapol    Review of Systems   Review of Systems  Respiratory:  Positive for  shortness of breath.   All other systems reviewed and are negative.   Physical Exam Updated Vital Signs BP (!) 165/89 (BP Location: Left Arm)   Pulse 79   Temp 97.7 F (36.5 C) (Oral)   Resp 18   Ht 1.6 m ( )   Wt 62.6 kg   SpO2 94%   BMI 24.45 kg/m  Physical Exam Vitals and nursing note reviewed.  Constitutional:      General: She is in acute distress.     Appearance: She is well-developed.  HENT:     Head: Normocephalic and atraumatic.     Mouth/Throat:     Pharynx: No oropharyngeal exudate.  Eyes:     General: No scleral icterus.       Right eye: No discharge.        Left eye: No discharge.     Conjunctiva/sclera: Conjunctivae normal.     Pupils: Pupils are equal, round, and reactive to light.  Neck:     Thyroid: No thyromegaly.     Vascular: No JVD.  Cardiovascular:     Rate and Rhythm: Normal rate and regular rhythm.     Heart sounds: Normal heart sounds. No murmur heard.    No friction rub. No gallop.  Pulmonary:     Effort: Respiratory distress present.     Breath sounds: Wheezing present. No rales.  Abdominal:     General: Bowel sounds are normal. There is no distension.     Palpations: Abdomen is soft. There is no mass.     Tenderness: There is no abdominal tenderness.  Musculoskeletal:        General: No tenderness. Normal range of motion.     Cervical back: Normal range of motion and neck supple.     Right lower leg: No edema.     Left lower leg: No edema.  Lymphadenopathy:     Cervical: No cervical adenopathy.  Skin:    General: Skin is warm and dry.     Findings: No erythema or rash.  Neurological:     Mental Status: She is alert.     Coordination: Coordination normal.  Psychiatric:        Behavior: Behavior normal.     ED Results / Procedures / Treatments   Labs (all labs ordered are listed, but only abnormal results are displayed) Labs Reviewed  COMPREHENSIVE METABOLIC PANEL - Abnormal; Notable for the following components:       Result Value   Glucose, Bld 138 (*)    All other components within normal limits  CBC WITH DIFFERENTIAL/PLATELET - Abnormal; Notable for the following components:   Hemoglobin 15.9 (*)    HCT 46.4 (*)    Neutro Abs 8.3 (*)    All other components within normal limits  TROPONIN I (HIGH SENSITIVITY) - Abnormal; Notable for the following components:   Troponin I (High  Sensitivity) 35 (*)    All other components within normal limits  TROPONIN I (HIGH SENSITIVITY) - Abnormal; Notable for the following components:   Troponin I (High Sensitivity) 42 (*)    All other components within normal limits    EKG EKG Interpretation  Date/Time:  Friday July 19 2022 15:24:34 EDT Ventricular Rate:  65 PR Interval:  136 QRS Duration: 72 QT Interval:  398 QTC Calculation: 413 R Axis:   77 Text Interpretation: Normal sinus rhythm Nonspecific ST abnormality Abnormal ECG When compared with ECG of 23-Dec-2017 14:06, No significant change was found Confirmed by Eber Hong ([id number]) on 07/19/2022 3:38:53 PM  Radiology DG Chest 2 View  Result Date: 07/19/2022 CLINICAL DATA:  Shortness of breath EXAM: CHEST - 2 VIEW COMPARISON:  X-ray 04/25/2021 and older FINDINGS: Hyperinflation. Normal cardiopericardial silhouette with calcified aorta. No consolidation, pneumothorax or effusion. No edema. There is a nodular density overlying the left lung apex overlying the course of the anterior aspect of the left first rib. Uncertain etiology. This was not clearly seen previously. Recommend follow up CT scan or apical lordotic film to further assess. Surgical clips in the right axillary region. Curvature and degenerative changes of the spine. IMPRESSION: Hyperinflation. Faint nodular focus along the left upper lung uncertain etiology. Recommend follow up apical lordotic x-ray or follow up CT scan when appropriate Electronically Signed   By: Karen Kays M.D.   On: 07/19/2022 16:07    Procedures .Critical Care  Performed  by: Eber Hong, MD Authorized by: Eber Hong, MD   Critical care provider statement:    Critical care time (minutes):  45   Critical care time was exclusive of:  Separately billable procedures and treating other patients   Critical care was necessary to treat or prevent imminent or life-threatening deterioration of the following conditions:  Cardiac failure and respiratory failure   Critical care was time spent personally by me on the following activities:  Development of treatment plan with patient or surrogate, discussions with consultants, evaluation of patient's response to treatment, examination of patient, obtaining history from patient or surrogate, review of old charts, re-evaluation of patient's condition, pulse oximetry, ordering and review of radiographic studies, ordering and review of laboratory studies and ordering and performing treatments and interventions   I assumed direction of critical care for this patient from another provider in my specialty: no     Care discussed with: admitting provider   Comments:         {Document cardiac monitor, telemetry assessment procedure when appropriate:1}  Medications Ordered in ED Medications  albuterol (PROVENTIL,VENTOLIN) solution continuous neb (has no administration in time range)  methylPREDNISolone sodium succinate (SOLU-MEDROL) 125 mg/2 mL injection 125 mg (has no administration in time range)    ED Course/ Medical Decision Making/ A&P   {   Click here for ABCD2, HEART and other calculatorsREFRESH Note before signing :1}                          Medical Decision Making Amount and/or Complexity of Data Reviewed Radiology: ordered.  Risk Prescription drug management.   This patient presents to the ED for concern of shortness of breath and wheezing as well as bradycardia, this involves an extensive number of treatment options, and is a complaint that carries with it a high risk of complications and morbidity.  The  differential diagnosis includes COPD, emphysema, acute hypoxic or hypercapnic respiratory failure, would also consider acute coronary  syndrome especially given the bradycardia expels with her shortness of breath on exertion   Co morbidities that complicate the patient evaluation  COPD, centrilobular emphysema   Additional history obtained:  Additional history obtained from electronic medical record External records from outside source obtained and reviewed including prior heart stress test and echocardiogram   Lab Tests:  I Ordered, and personally interpreted labs.  The pertinent results include: CBC and metabolic panel unremarkable, troponin has risen from 35-42   Imaging Studies ordered:  I ordered imaging studies including chest x-ray I independently visualized and interpreted imaging which showed no acute infiltrate however there does appear to be faint nodular focus on the left upper lung, there is hyperinflation, this does need to be followed up with CT scan as an outpatient I agree with the radiologist interpretation   Cardiac Monitoring: / EKG:  The patient was maintained on a cardiac monitor.  I personally viewed and interpreted the cardiac monitored which showed an underlying rhythm of: Normal sinus rhythm   Consultations Obtained:  I requested consultation with the cardiologist,  and discussed lab and imaging findings as well as pertinent plan - they recommend: ***   Problem List / ED Course / Critical interventions / Medication management  I walked with the patient in the ED during which time she became severely symptomatic and can only take 2 or 3 steps without stopping to take breaths and gasping fashion.  She is wheezing. I ordered medication including continuous nebulizer and steroids for show this of breath Reevaluation of the patient after these medicines showed that the patient slight improvement I have reviewed the patients home medicines and have made  adjustments as needed   Social Determinants of Health:  Severe COPD   Test / Admission - Considered:  Likely needs admission due to uptrending troponin, report of bradycardia with ambulating and her symptomatic exertional shortness of breath   {Document critical care time when appropriate:1} {Document review of labs and clinical decision tools ie heart score, Chads2Vasc2 etc:1}  {Document your independent review of radiology images, and any outside records:1} {Document your discussion with family members, caretakers, and with consultants:1} {Document social determinants of health affecting pt's care:1} {Document your decision making why or why not admission, treatments were needed:1} Final Clinical Impression(s) / ED Diagnoses Final diagnoses:  None    Rx / DC Orders ED Discharge Orders     None

## 2022-07-19 NOTE — H&P (Signed)
History and Physical    Patient: Elaine [name]:[id number] DOB: Jan 26, 1946 DOA: 07/19/2022 DOS: the patient was seen and examined on 07/20/2022 PCP: Zachery Dauer, MD  Patient coming from: Home  Chief Complaint:  Chief Complaint  Patient presents with   Shortness of Breath   HPI: Elaine [name] is a 77 y.o. female with medical history significant of COPD on supplemental oxygen via [place] at 2 LPM at night as needed, hypothyroidism, hyperlipidemia, hypertension who presents to the emergency department due to shortness of breath that has been ongoing for about a week.  She complained of worsening shortness of breath within the last 24 hours and also complained of decreased heart rate to 30s to 40s when she ambulates, however, she continues to maintain O2 sat in the 90s.  Patient denies any nausea, vomiting, fever, chills, leg swelling.  ED Course:  In the emergency department, BP was elevated at 170/86, but other vital signs were within normal range.  Workup in the ED showed normal CBC except for H/H of 15.9/46.4.  BMP was normal except for blood glucose of 138, troponin x 2 - 35 > 42. Chest x-ray showed hyperinflation. Faint nodular focus along the left upper lung uncertain etiology. Recommend follow up apical lordotic x-ray or follow up CT scan when Appropriate She was treated with Solu-Medrol 125 mg x 1.  Hospitalist was asked to admit patient for further evaluation and management.   Review of Systems: Review of systems as noted in the HPI. All other systems reviewed and are negative.   Past Medical History:  Diagnosis Date   Arrhythmia    Breast cancer 1995   left    Cataract    Complication of anesthesia    Emphysema of lung    Family history of breast cancer    Hiatal hernia 1992   Hypertension    Hyperthyroidism    Osteoporosis    PONV (postoperative nausea and vomiting)    Past Surgical History:  Procedure Laterality Date   BRONCHIAL WASHINGS  05/01/2021    Procedure: BRONCHIAL WASHINGS;  Surgeon: Luciano Cutter, MD;  Location: [facility];  Service: Cardiopulmonary;;   CATARACT EXTRACTION     MASTECTOMY Left 1995   MASTECTOMY W/ SENTINEL NODE BIOPSY Right 12/30/2017   MASTECTOMY W/ SENTINEL NODE BIOPSY Right 12/30/2017   Procedure: MASTECTOMY WITH SENTINEL LYMPH NODE BIOPSY;  Surgeon: Emelia Loron, MD;  Location: [facility];  Service: General;  Laterality: Right;   TOTAL ABDOMINAL HYSTERECTOMY  06/22/1993   VIDEO BRONCHOSCOPY N/A 05/01/2021   Procedure: VIDEO BRONCHOSCOPY WITHOUT FLUORO;  Surgeon: Luciano Cutter, MD;  Location: [facility];  Service: Cardiopulmonary;  Laterality: N/A;    Social History:  reports that she has been smoking cigarettes. She has a 50.00 pack-year smoking history. She has never used smokeless tobacco. She reports current alcohol use. She reports that she does not use drugs.   Allergies  Allergen Reactions   Bee Venom Other (See Comments)    Unknown   Codeine Nausea Only   Macrobid [Nitrofurantoin Macrocrystal] Other (See Comments)    Unknown   Morphine And Related Other (See Comments)    Unknown   Oxycodone Nausea Only   Penicillins Other (See Comments)    Unknown Has patient had a PCN reaction causing immediate rash, facial/tongue/throat swelling, SOB or lightheadedness with hypotension: Unknown Has patient had a PCN reaction causing severe rash involving mucus membranes or skin necrosis: Unknown Has patient had a PCN reaction that required  hospitalization: Unknown Has patient had a PCN reaction occurring within the last 10 years: No If all of the above answers are "NO", then may proceed with Cephalosporin use.    Tyloxapol Nausea Only and Other (See Comments)    Extreme Drugged feeling, Doesn't affect pain    Family History  Problem Relation Age of Onset   Aneurysm Mother    Heart attack Father    Diabetes Sister    Diabetes Sister    Breast cancer Cousin 73   Breast cancer Cousin         20's/30's   Breast cancer Cousin        3 of [id number] (mom's cousins) had breast cancer 70+    Full code Prior to Admission medications   Medication Sig Start Date End Date Taking? Authorizing Provider  aspirin 81 MG EC tablet Take 81 mg by mouth daily. Swallow whole.    [provider]  azithromycin (ZITHROMAX) 250 MG tablet Take 1 tablet (250 mg total) by mouth every Monday, Wednesday, and Friday. 06/10/22   Luciano Cutter, MD  denosumab (PROLIA) 60 MG/ML SOSY injection Inject 60 mg into the skin every 6 (six) months.     [provider]  Fluticasone-Umeclidin-Vilant (TRELEGY ELLIPTA) 200-62.5-25 MCG/ACT AEPB Inhale 1 puff into the lungs daily. 06/10/22   Luciano Cutter, MD  HYDROcodone-acetaminophen (NORCO/VICODIN) 5-325 MG tablet Take 1-2 tablets by mouth every 6 (six) hours as needed. Patient not taking: Reported on 06/10/2022 05/15/22   Benjiman Core, MD  ipratropium-albuterol (DUONEB) 0.5-2.5 (3) MG/3ML SOLN Inhale 3 mLs into the lungs See admin instructions. Use 1 vial via nebulizer daily. May use additional vial if needed at bedtime for shortness of breath. 10/15/21   Luciano Cutter, MD  levothyroxine (SYNTHROID) 50 MCG tablet Take 50 mcg by mouth every other day.    [provider]  losartan (COZAAR) 50 MG tablet Take 50 mg by mouth daily.    [provider]  metroNIDAZOLE (FLAGYL) 500 MG tablet Take 1 tablet (500 mg total) by mouth 2 (two) times daily. 05/15/22   Benjiman Core, MD  OXYGEN Inhale 2 L into the lungs continuous.    [provider]  predniSONE (DELTASONE) 10 MG tablet Take 4 tablets (40 mg total) by mouth daily with breakfast for 2 days, THEN 3 tablets (30 mg total) daily with breakfast for 2 days, THEN 2 tablets (20 mg total) daily with breakfast for 2 days, THEN 1 tablet (10 mg total) daily with breakfast for 2 days. 07/18/22 07/25/22  Luciano Cutter, MD  rosuvastatin (CRESTOR) 5 MG tablet Take 5 mg by  mouth daily. 02/01/19   [provider]  SYNTHROID 75 MCG tablet Take 75 mcg by mouth every other day.    [provider]  VENTOLIN HFA 108 (90 Base) MCG/ACT inhaler INHALE 2 PUFFS BY MOUTH EVERY 4 HOURS AS NEEDED FOR WHEEZE OR FOR SHORTNESS OF BREATH 03/20/22   Luciano Cutter, MD    Physical Exam: BP (!) 174/68 (BP Location: Left Arm)   Pulse 99   Temp 97.7 F (36.5 C) (Oral)   Resp 16   Ht 5\' 3"  (1.6 m)   Wt 62.6 kg   SpO2 93%   BMI 24.45 kg/m   General: 77 y.o. year-old female ill appearing, but in no acute distress.  Alert and oriented x3. HEENT: NCAT, EOMI Neck: Supple, trachea medial Cardiovascular: Regular rate and rhythm with no rubs or gallops.  No thyromegaly  or JVD noted.  No lower extremity edema. 2/4 pulses in all 4 extremities. Respiratory: Diffuse wheezing on auscultation with no ales.  Abdomen: Soft, nontender nondistended with normal bowel sounds x4 quadrants. Muskuloskeletal: No cyanosis, clubbing or edema noted bilaterally Neuro: CN II-XII intact, strength 5/5 x 4, sensation, reflexes intact Skin: No ulcerative lesions noted or rashes Psychiatry: Judgement and insight appear normal. Mood is appropriate for condition and setting          Labs on Admission:  Basic Metabolic Panel: Recent Labs  Lab 07/19/22 1640  NA 139  K 4.7  CL 104  CO2 26  GLUCOSE 138*  BUN 18  CREATININE 0.74  CALCIUM 9.5   Liver Function Tests: Recent Labs  Lab 07/19/22 1640  AST 21  ALT 17  ALKPHOS 63  BILITOT 0.7  PROT 7.2  ALBUMIN 4.4   No results for input(s): "LIPASE", "AMYLASE" in the last 168 hours. No results for input(s): "AMMONIA" in the last 168 hours. CBC: Recent Labs  Lab 07/19/22 1640  WBC 9.3  NEUTROABS 8.3*  HGB 15.9*  HCT 46.4*  MCV 93.0  PLT 267   Cardiac Enzymes: No results for input(s): "CKTOTAL", "CKMB", "CKMBINDEX", "TROPONINI" in the last 168 hours.  BNP (last 3 results) No results for input(s): "BNP" in the last  8760 hours.  ProBNP (last 3 results) No results for input(s): "PROBNP" in the last 8760 hours.  CBG: No results for input(s): "GLUCAP" in the last 168 hours.  Radiological Exams on Admission: DG Chest 2 View  Result Date: 07/19/2022 CLINICAL DATA:  Shortness of breath EXAM: CHEST - 2 VIEW COMPARISON:  X-ray 04/25/2021 and older FINDINGS: Hyperinflation. Normal cardiopericardial silhouette with calcified aorta. No consolidation, pneumothorax or effusion. No edema. There is a nodular density overlying the left lung apex overlying the course of the anterior aspect of the left first rib. Uncertain etiology. This was not clearly seen previously. Recommend follow up CT scan or apical lordotic film to further assess. Surgical clips in the right axillary region. Curvature and degenerative changes of the spine. IMPRESSION: Hyperinflation. Faint nodular focus along the left upper lung uncertain etiology. Recommend follow up apical lordotic x-ray or follow up CT scan when appropriate Electronically Signed   By: Karen Kays M.D.   On: 07/19/2022 16:07    EKG: I independently viewed the EKG done and my findings are as followed: Normal sinus rhythm at a rate of 65 bpm with nonspecific ST abnormality  Assessment/Plan Present on Admission:  COPD with acute exacerbation  Faint nodular focus along the left upper lung uncertain etiology. Recommend follow up apical lordotic x-ray or follow up CT scan when Appropriate  Acute on chronic respiratory failure with hypoxia This is possibly secondary to COPD with acute exacerbation Continue supplemental oxygen to maintain O2 sat > 92%  COPD with acute exacerbation Continue duo nebs, Mucinex, Solu-Medrol, azithromycin. Continue Protonix to prevent steroid-induced ulcer Continue incentive spirometry and flutter valve Continue supplemental oxygen to maintain O2 sat > 92% with plan to wean patient off oxygen as tolerated  Lung nodule Chest x-ray showed faint  nodular focus along the left upper lung.  Apical lordotic x-ray or follow up CT scan when appropriate was recommended  Elevated troponin possibly secondary to type II demand ischemia Troponin 35 . 42, she denies chest pain, continue to trend troponin  Questionable bradycardia on ambulation Patient complained of HR in 30s to 40s when ambulating HR has been normal since arrival to the ED Cardiologist  at [facility] was consulted and recommended that patient can stay here since she has no chest pain and to monitor HR. cardiology [place] reconsulted if patient continues to be bradycardic with symptoms per EDP. Continue telemetry  Essential hypertension Continue losartan Continue IV hydralazine 10 mg every 6 hours as needed for SBP  >170  Mixed hyperlipidemia Continue Crestor  Acquired hypothyroidism Continue Synthroid   DVT prophylaxis: Lovenox   Advance Care Planning: Full Code  Consults: None  Family Communication: None at bedside  Severity of Illness: The appropriate patient status for this patient is OBSERVATION. Observation status is judged to be reasonable and necessary in order to provide the required intensity of service to ensure the patient's safety. The patient's presenting symptoms, physical exam findings, and initial radiographic and laboratory data in the context of their medical condition is felt to place them at decreased risk for further clinical deterioration. Furthermore, it is anticipated that the patient will be medically stable for discharge from the hospital within 2 midnights of admission.   Author: Frankey Shown, DO 07/20/2022 2:33 AM  For on call review [email or web address].

## 2022-07-19 NOTE — ED Provider Triage Note (Signed)
Emergency Medicine Provider Triage Evaluation Note  Elaine [name] , a 77 y.o. female  was evaluated in triage.  Pt complains of shortness of breath.  Patient reports shortness of breath since last week.  Initially thought symptoms were related to seasonal allergies.  States she has felt short of breath with exertion and noticed her heart rate dropped down into the 30s/40s with ambulation.  She has noted some pain in bilateral jaw area as well as pressure in her sinuses when she feels her heart rate is dropping low.   Contacted outpatient provider who told her to use 2 L as needed of oxygen nasal cannula at night as well as intermittently throughout the day as needed.  Patient states that she does not use oxygen at baseline.  Patient on day 3/4 being treated for COPD exacerbation with azithromycin, prednisone.  Denies fever, chills, chest pain, abdominal pain, nausea, vomiting.  Review of Systems  Positive: See above Negative:   Physical Exam  BP (!) 188/86 (BP Location: Left Arm)   Pulse 79   Temp 97.9 F (36.6 C) (Oral)   Resp 18   Ht  (1.6 m)   Wt 62.6 kg   SpO2 94%   BMI 24.45 kg/m  Gen:   Awake, no distress   Resp:  Normal effort  MSK:   Moves extremities without difficulty  Other:    Medical Decision Making  Medically screening exam initiated at 4:24 PM.  Appropriate orders placed.  Jamyiah JEANITA CARNEIRO was informed that the remainder of the evaluation will be completed by another provider, this initial triage assessment does not replace that evaluation, and the importance of remaining in the ED until their evaluation is complete.     Peter Garter, [place] 07/19/22 1626

## 2022-07-19 NOTE — ED Notes (Signed)
Dr Hyacinth Meeker at bedside. Elaine [name]

## 2022-07-19 NOTE — ED Notes (Signed)
Dr Adefeso at bedside. Jacoba Cherney Kimaya Whitlatch,RN 

## 2022-07-19 NOTE — Progress Notes (Signed)
Patient presented to the floor with increased work of breathing and patient stated "My heart rate got down to 35 in the emergency room." Upon assessment HR per telemetry monitor was 95, and per dinamap using finger sensor HR was 47. Telemetry called stating patient was in ventricular Bigeminy. 12 lead EKG obtained at bedside along with vitals. Dr. Thomes Dinning made aware, no new orders at this time.

## 2022-07-19 NOTE — ED Triage Notes (Addendum)
Pt with c/o SOB since last week, on O2 at night.   Pt with hx of COPD.  Pt has been watching her oxygen levels at home.   + SOB with exertion as well, states her HR will down to the 40's with exertion. Also c/o whole jaw pain that initially started at bridge of pt's nose but has moved to her jaw.

## 2022-07-20 ENCOUNTER — Encounter (HOSPITAL_BASED_OUTPATIENT_CLINIC_OR_DEPARTMENT_OTHER): Payer: Self-pay | Admitting: Pulmonary Disease

## 2022-07-20 ENCOUNTER — Observation Stay (HOSPITAL_COMMUNITY): Payer: Medicare Other

## 2022-07-20 DIAGNOSIS — J9621 Acute and chronic respiratory failure with hypoxia: Secondary | ICD-10-CM

## 2022-07-20 DIAGNOSIS — R7989 Other specified abnormal findings of blood chemistry: Secondary | ICD-10-CM | POA: Insufficient documentation

## 2022-07-20 DIAGNOSIS — Z833 Family history of diabetes mellitus: Secondary | ICD-10-CM | POA: Diagnosis not present

## 2022-07-20 DIAGNOSIS — J439 Emphysema, unspecified: Secondary | ICD-10-CM | POA: Diagnosis present

## 2022-07-20 DIAGNOSIS — J9601 Acute respiratory failure with hypoxia: Secondary | ICD-10-CM | POA: Diagnosis present

## 2022-07-20 DIAGNOSIS — F17210 Nicotine dependence, cigarettes, uncomplicated: Secondary | ICD-10-CM | POA: Diagnosis present

## 2022-07-20 DIAGNOSIS — Z7982 Long term (current) use of aspirin: Secondary | ICD-10-CM | POA: Diagnosis not present

## 2022-07-20 DIAGNOSIS — F419 Anxiety disorder, unspecified: Secondary | ICD-10-CM | POA: Diagnosis present

## 2022-07-20 DIAGNOSIS — Z853 Personal history of malignant neoplasm of breast: Secondary | ICD-10-CM | POA: Diagnosis not present

## 2022-07-20 DIAGNOSIS — Z803 Family history of malignant neoplasm of breast: Secondary | ICD-10-CM | POA: Diagnosis not present

## 2022-07-20 DIAGNOSIS — E782 Mixed hyperlipidemia: Secondary | ICD-10-CM | POA: Diagnosis present

## 2022-07-20 DIAGNOSIS — Z91030 Bee allergy status: Secondary | ICD-10-CM | POA: Diagnosis not present

## 2022-07-20 DIAGNOSIS — J441 Chronic obstructive pulmonary disease with (acute) exacerbation: Secondary | ICD-10-CM | POA: Diagnosis present

## 2022-07-20 DIAGNOSIS — Z79890 Hormone replacement therapy (postmenopausal): Secondary | ICD-10-CM | POA: Diagnosis not present

## 2022-07-20 DIAGNOSIS — E039 Hypothyroidism, unspecified: Secondary | ICD-10-CM | POA: Insufficient documentation

## 2022-07-20 DIAGNOSIS — Z79899 Other long term (current) drug therapy: Secondary | ICD-10-CM | POA: Diagnosis not present

## 2022-07-20 DIAGNOSIS — Z8249 Family history of ischemic heart disease and other diseases of the circulatory system: Secondary | ICD-10-CM | POA: Diagnosis not present

## 2022-07-20 DIAGNOSIS — Z888 Allergy status to other drugs, medicaments and biological substances status: Secondary | ICD-10-CM | POA: Diagnosis not present

## 2022-07-20 DIAGNOSIS — I10 Essential (primary) hypertension: Secondary | ICD-10-CM | POA: Diagnosis present

## 2022-07-20 DIAGNOSIS — R911 Solitary pulmonary nodule: Secondary | ICD-10-CM | POA: Insufficient documentation

## 2022-07-20 DIAGNOSIS — Z9012 Acquired absence of left breast and nipple: Secondary | ICD-10-CM | POA: Diagnosis not present

## 2022-07-20 DIAGNOSIS — Z880 Allergy status to penicillin: Secondary | ICD-10-CM | POA: Diagnosis not present

## 2022-07-20 DIAGNOSIS — Z885 Allergy status to narcotic agent status: Secondary | ICD-10-CM | POA: Diagnosis not present

## 2022-07-20 DIAGNOSIS — I2489 Other forms of acute ischemic heart disease: Secondary | ICD-10-CM | POA: Diagnosis present

## 2022-07-20 DIAGNOSIS — R008 Other abnormalities of heart beat: Secondary | ICD-10-CM | POA: Diagnosis present

## 2022-07-20 LAB — TROPONIN I (HIGH SENSITIVITY)
Troponin I (High Sensitivity): 57 ng/L — ABNORMAL HIGH (ref <18)
Troponin I (High Sensitivity): 53 ng/L — ABNORMAL HIGH (ref <18)
Troponin I (High Sensitivity): 38 ng/L — ABNORMAL HIGH (ref <18)
Troponin I (High Sensitivity): 34 ng/L — ABNORMAL HIGH (ref <18)

## 2022-07-20 LAB — RESPIRATORY PANEL BY PCR

## 2022-07-20 LAB — CBC
HCT: 45.1 % (ref 36.0–46.0)
Hemoglobin: 15.5 g/dL — ABNORMAL HIGH (ref 12.0–15.0)
MCH: 31.8 pg (ref 26.0–34.0)
MCHC: 34.4 g/dL (ref 30.0–36.0)
MCV: 92.6 fL (ref 80.0–100.0)
Platelets: 258 10*3/uL (ref 150–400)
RBC: 4.87 MIL/uL (ref 3.87–5.11)
RDW: 13.1 % (ref 11.5–15.5)
WBC: 11.1 10*3/uL — ABNORMAL HIGH (ref 4.0–10.5)
nRBC: 0 % (ref 0.0–0.2)

## 2022-07-20 LAB — COMPREHENSIVE METABOLIC PANEL
ALT: 15 U/L (ref 0–44)
AST: 22 U/L (ref 15–41)
Albumin: 4 g/dL (ref 3.5–5.0)
Alkaline Phosphatase: 58 U/L (ref 38–126)
Anion gap: 10 (ref 5–15)
BUN: 21 mg/dL (ref 8–23)
CO2: 22 mmol/L (ref 22–32)
Calcium: 8.9 mg/dL (ref 8.9–10.3)
Chloride: 108 mmol/L (ref 98–111)
Creatinine, Ser: 0.78 mg/dL (ref 0.44–1.00)
GFR, Estimated: >60 mL/min (ref >60)
Glucose, Bld: 142 mg/dL — ABNORMAL HIGH (ref 70–99)
Potassium: 3.6 mmol/L (ref 3.5–5.1)
Sodium: 140 mmol/L (ref 135–145)
Total Bilirubin: 0.7 mg/dL (ref 0.3–1.2)
Total Protein: 6.7 g/dL (ref 6.5–8.1)

## 2022-07-20 LAB — PHOSPHORUS: Phosphorus: 3.5 mg/dL (ref 2.5–4.6)

## 2022-07-20 LAB — MAGNESIUM: Magnesium: 1.9 mg/dL (ref 1.7–2.4)

## 2022-07-20 MED ORDER — ONDANSETRON HCL 4 MG PO TABS: 4.0000 mg | ORAL_TABLET | Freq: Four times a day (QID) | ORAL | Status: DC | PRN

## 2022-07-20 MED ORDER — MAGNESIUM SULFATE 2 GM/50ML IV SOLN
2.0000 g | Freq: Once | INTRAVENOUS | Status: AC
  Administered 2022-07-20: 2 g via INTRAVENOUS
  Filled 2022-07-20: qty 50

## 2022-07-20 MED ORDER — HYDROXYZINE HCL 10 MG PO TABS
10.0000 mg | ORAL_TABLET | Freq: Three times a day (TID) | ORAL | Status: DC | PRN
  Administered 2022-07-20: 10 mg via ORAL
  Filled 2022-07-20: qty 1

## 2022-07-20 MED ORDER — ACETAMINOPHEN 325 MG PO TABS
650.0000 mg | ORAL_TABLET | Freq: Four times a day (QID) | ORAL | Status: DC | PRN
  Administered 2022-07-20: 650 mg via ORAL
  Filled 2022-07-20: qty 2

## 2022-07-20 MED ORDER — LOSARTAN POTASSIUM 50 MG PO TABS
50.0000 mg | ORAL_TABLET | Freq: Every day | ORAL | Status: DC
  Administered 2022-07-20 – 2022-07-21 (×2): 50 mg via ORAL
  Filled 2022-07-20 (×2): qty 1

## 2022-07-20 MED ORDER — POTASSIUM CHLORIDE CRYS ER 20 MEQ PO TBCR
20.0000 meq | EXTENDED_RELEASE_TABLET | Freq: Once | ORAL | Status: AC
  Administered 2022-07-20: 20 meq via ORAL
  Filled 2022-07-20: qty 1

## 2022-07-20 MED ORDER — HYDRALAZINE HCL 20 MG/ML IJ SOLN
10.0000 mg | Freq: Four times a day (QID) | INTRAMUSCULAR | Status: DC | PRN
  Administered 2022-07-20: 10 mg via INTRAVENOUS
  Filled 2022-07-20: qty 1

## 2022-07-20 MED ORDER — IPRATROPIUM-ALBUTEROL 0.5-2.5 (3) MG/3ML IN SOLN: 3.0000 mL | RESPIRATORY_TRACT | Status: DC | PRN

## 2022-07-20 MED ORDER — LEVOTHYROXINE SODIUM 50 MCG PO TABS
50.0000 ug | ORAL_TABLET | ORAL | Status: DC
  Administered 2022-07-21: 50 ug via ORAL
  Filled 2022-07-20: qty 2

## 2022-07-20 MED ORDER — ROSUVASTATIN CALCIUM 10 MG PO TABS
5.0000 mg | ORAL_TABLET | Freq: Every day | ORAL | Status: DC
  Administered 2022-07-20 – 2022-07-21 (×2): 5 mg via ORAL
  Filled 2022-07-20 (×2): qty 1

## 2022-07-20 MED ORDER — AZITHROMYCIN 250 MG PO TABS
500.0000 mg | ORAL_TABLET | Freq: Every day | ORAL | Status: AC
  Administered 2022-07-20: 500 mg via ORAL
  Filled 2022-07-20: qty 2

## 2022-07-20 MED ORDER — ARFORMOTEROL TARTRATE 15 MCG/2ML IN NEBU
15.0000 ug | INHALATION_SOLUTION | Freq: Two times a day (BID) | RESPIRATORY_TRACT | Status: DC
  Administered 2022-07-20 – 2022-07-21 (×3): 15 ug via RESPIRATORY_TRACT
  Filled 2022-07-20 (×3): qty 2

## 2022-07-20 MED ORDER — DM-GUAIFENESIN ER 30-600 MG PO TB12
1.0000 | ORAL_TABLET | Freq: Two times a day (BID) | ORAL | Status: DC
  Administered 2022-07-20 – 2022-07-21 (×4): 1 via ORAL
  Filled 2022-07-20 (×4): qty 1

## 2022-07-20 MED ORDER — METHYLPREDNISOLONE SODIUM SUCC 40 MG IJ SOLR
40.0000 mg | Freq: Two times a day (BID) | INTRAMUSCULAR | Status: DC
  Administered 2022-07-20 – 2022-07-21 (×3): 40 mg via INTRAVENOUS
  Filled 2022-07-20 (×3): qty 1

## 2022-07-20 MED ORDER — AZITHROMYCIN 250 MG PO TABS
250.0000 mg | ORAL_TABLET | Freq: Every day | ORAL | Status: DC
  Administered 2022-07-21: 250 mg via ORAL
  Filled 2022-07-20: qty 1

## 2022-07-20 MED ORDER — LEVOTHYROXINE SODIUM 75 MCG PO TABS
75.0000 ug | ORAL_TABLET | ORAL | Status: DC
  Administered 2022-07-20: 75 ug via ORAL
  Filled 2022-07-20: qty 1

## 2022-07-20 MED ORDER — BUDESONIDE 0.5 MG/2ML IN SUSP
0.5000 mg | Freq: Two times a day (BID) | RESPIRATORY_TRACT | Status: DC
  Administered 2022-07-20 – 2022-07-21 (×3): 0.5 mg via RESPIRATORY_TRACT
  Filled 2022-07-20 (×3): qty 2

## 2022-07-20 MED ORDER — ACETAMINOPHEN 650 MG RE SUPP: 650.0000 mg | Freq: Four times a day (QID) | RECTAL | Status: DC | PRN

## 2022-07-20 MED ORDER — IPRATROPIUM-ALBUTEROL 0.5-2.5 (3) MG/3ML IN SOLN
3.0000 mL | Freq: Four times a day (QID) | RESPIRATORY_TRACT | Status: DC
  Administered 2022-07-20 (×4): 3 mL via RESPIRATORY_TRACT
  Filled 2022-07-20 (×5): qty 3

## 2022-07-20 MED ORDER — ONDANSETRON HCL 4 MG/2ML IJ SOLN: 4.0000 mg | Freq: Four times a day (QID) | INTRAMUSCULAR | Status: DC | PRN

## 2022-07-20 MED ORDER — ENOXAPARIN SODIUM 40 MG/0.4ML IJ SOSY
40.0000 mg | PREFILLED_SYRINGE | INTRAMUSCULAR | Status: DC
  Administered 2022-07-20 – 2022-07-21 (×2): 40 mg via SUBCUTANEOUS
  Filled 2022-07-20 (×2): qty 0.4

## 2022-07-20 MED ORDER — PANTOPRAZOLE SODIUM 40 MG PO TBEC
40.0000 mg | DELAYED_RELEASE_TABLET | Freq: Every day | ORAL | Status: DC
  Filled 2022-07-20 (×2): qty 1

## 2022-07-20 NOTE — Hospital Course (Signed)
77 year old female with a history of COPD, chronic respiratory failure on 2 L at nighttime, hypertension, hyperlipidemia, hypothyroidism presenting with 2-week history of sinus and chest congestion, cough, shortness of breath.  She stated that her shortness of breath had gradually worsened with her congestion.  She contacted her pulmonologist on 07/17/2022.  The patient was given a prescription for prednisone.  The patient normally takes azithromycin 250 mg on Monday, Wednesdays, and Fridays year-round.  She began taking the azithromycin daily since 07/17/2022.  She had some subjective night sweats.  She denies any frank fevers or chills.  She denies any headache, chest pain, hemoptysis, nausea, vomiting, diarrhea, abdominal pain.  She quit smoking November 2019, but has since restarted.  She has a 50-pack-year history.  She continues to smoke 1/2 pack/day.  Notably, the patient has some type of heart monitor and pulse oximeter at home.  She noted that her oxygen saturation was less than 90 and her heart rate in the 30s at home. In the ED, the patient was afebrile and hemodynamically stable with oxygen saturation 92-93% on 2 L.  WBC 9.3, hemoglobin 15.9, platelets 267,000.  Sodium 140, potassium 3.6, bicarbonate 22, serum creatinine 0.78.  LFTs were unremarkable.  Troponin 35>> 42>> 57>> 53.  She was started on IV Solu-Medrol and bronchodilators. She improved clinically, and she was weaned off of oxygen.  She continued to use as needed oxygen with exertion when she began having a little shortness of breath.  However, ambulatory pulse oximetry on the day of discharge on room air and did not show desaturation <92%.

## 2022-07-20 NOTE — Progress Notes (Signed)
PROGRESS NOTE  Elaine [name] WUJ:[id number] DOB: 10/29/1945 DOA: 07/19/2022 PCP: Zachery Dauer, MD  Brief History:  77 year old female with a history of COPD, chronic respiratory failure on 2 L at nighttime, hypertension, hyperlipidemia, hypothyroidism presenting with 2-week history of sinus and chest congestion, cough, shortness of breath.  She stated that her shortness of breath had gradually worsened with her congestion.  She contacted her pulmonologist on 07/17/2022.  The patient was given a prescription for prednisone.  The patient normally takes azithromycin 250 mg on Monday, Wednesdays, and Fridays year-round.  She began taking the azithromycin daily since 07/17/2022.  She had some subjective night sweats.  She denies any frank fevers or chills.  She denies any headache, chest pain, hemoptysis, nausea, vomiting, diarrhea, abdominal pain.  She quit smoking November 2019, but has since restarted.  She has a 50-pack-year history.  She continues to smoke 1/2 pack/day.  Notably, the patient has some type of heart monitor and pulse oximeter at home.  She noted that her oxygen saturation was less than 90 and her heart rate in the 30s at home. In the ED, the patient was afebrile and hemodynamically stable with oxygen saturation 92-93% on 2 L.  WBC 9.3, hemoglobin 15.9, platelets 267,000.  Sodium 140, potassium 3.6, bicarbonate 22, serum creatinine 0.78.  LFTs were unremarkable.  Troponin 35>> 42>> 57>> 53.  She was started on IV Solu-Medrol and bronchodilators.    Assessment/Plan: Acute on chronic respiratory failure with hypoxia -Secondary to COPD exacerbation -Wean oxygen back to baseline -Chronically on 2 L at nighttime -personally reviewed CXR--no infiltrates or edema  COPD Exacerbation -Start Pulmicort -Start Brovana -Continue IV Solu-Medrol -Continue DuoNebs -viral respiratory panel  Elevated troponin -Secondary to demand ischemia -No chest pain presently -Personally  reviewed EKG--sinus rhythm, nonspecific T wave changes, no high-grade AV block  Reported history of bradycardia -Patient reported bradycardia at home -Suspect this is likely due to artifact -Personally reviewed telemetry during hospitalization presently--patient has lots of artifact; no bradycardia, no high-grade AV block -Remain on telemetry  Essential hypertension -Continue losartan  Mixed hyperlipidemia -Continue statin  Hypothyroidism -Continue Synthroid  Nodular opacity -Noted on chest x-ray -CT chest    Family Communication: no  Family at bedside  Consultants:  none  Code Status:  FULL   DVT Prophylaxis:   [place] Lovenox   Procedures: As Listed in Progress Note Above  Antibiotics: Azithro 4/19>>     Subjective: Patient states that her breathing is not a whole lot better but not worse.  She denies any nausea, vomiting, diarrhea.  She has nonproductive cough.  She has shortness of breath with minimal exertion.  She denies any chest pain or abdominal pain.  Objective: Vitals:   07/20/22 0225 07/20/22 0312 07/20/22 0405 07/20/22 0619  BP:  (!) 142/69  (!) 149/82  Pulse:  (!) 58  68  Resp:  18  17  Temp:  97.6 F (36.4 C)  97.7 F (36.5 C)  TempSrc:  Oral  Oral  SpO2: 96% 93% 95% 97%  Weight:      Height:       No intake or output data in the 24 hours ending 07/20/22 0826 Weight change:  Exam:  General:  Pt is alert, follows commands appropriately, not in acute distress HEENT: No icterus, No thrush, No neck mass, [place]/AT Cardiovascular: RRR, S1/S2, no rubs, no gallops Respiratory: Diminished breath sounds bilateral.  Bibasilar rales. Abdomen: Soft/+BS, non tender,  non distended, no guarding Extremities: No edema, No lymphangitis, No petechiae, No rashes, no synovitis   Data Reviewed: I have personally reviewed following labs and imaging studies Basic Metabolic Panel: Recent Labs  Lab 07/19/22 1640 07/20/22 0331  NA 139 140  K 4.7 3.6  CL 104  108  CO2 26 22  GLUCOSE 138* 142*  BUN 18 21  CREATININE 0.74 0.78  CALCIUM 9.5 8.9  MG  --  1.9  PHOS  --  3.5   Liver Function Tests: Recent Labs  Lab 07/19/22 1640 07/20/22 0331  AST 21 22  ALT 17 15  ALKPHOS 63 58  BILITOT 0.7 0.7  PROT 7.2 6.7  ALBUMIN 4.4 4.0   No results for input(s): "LIPASE", "AMYLASE" in the last 168 hours. No results for input(s): "AMMONIA" in the last 168 hours. Coagulation Profile: No results for input(s): "INR", "PROTIME" in the last 168 hours. CBC: Recent Labs  Lab 07/19/22 1640 07/20/22 0331  WBC 9.3 11.1*  NEUTROABS 8.3*  --   HGB 15.9* 15.5*  HCT 46.4* 45.1  MCV 93.0 92.6  PLT 267 258   Cardiac Enzymes: No results for input(s): "CKTOTAL", "CKMB", "CKMBINDEX", "TROPONINI" in the last 168 hours. BNP: Invalid input(s): "POCBNP" CBG: No results for input(s): "GLUCAP" in the last 168 hours. HbA1C: No results for input(s): "HGBA1C" in the last 72 hours. Urine analysis:    Component Value Date/Time   COLORURINE YELLOW 05/15/2022 1210   APPEARANCEUR Clear 06/03/2022 1458   LABSPEC 1.026 05/15/2022 1210   PHURINE 5.0 05/15/2022 1210   GLUCOSEU Negative 06/03/2022 1458   HGBUR NEGATIVE 05/15/2022 1210   BILIRUBINUR Negative 06/03/2022 1458   KETONESUR 5 (A) 05/15/2022 1210   PROTEINUR Negative 06/03/2022 1458   PROTEINUR NEGATIVE 05/15/2022 1210   NITRITE Negative 06/03/2022 1458   NITRITE NEGATIVE 05/15/2022 1210   LEUKOCYTESUR Negative 06/03/2022 1458   LEUKOCYTESUR TRACE (A) 05/15/2022 1210   Sepsis Labs: (procalcitonin:4,lacticidven:4) )No results found for this or any previous visit (from the past 240 hour(s)).   Scheduled Meds:  arformoterol  15 mcg Nebulization BID   azithromycin  500 mg Oral Daily   Followed by   Melene Muller ON 07/21/2022] azithromycin  250 mg Oral Daily   budesonide (PULMICORT) nebulizer solution  0.5 mg Nebulization BID   dextromethorphan-guaiFENesin  1 tablet Oral BID   enoxaparin  (LOVENOX) injection  40 mg Subcutaneous Q24H   ipratropium-albuterol  3 mL Nebulization Q6H   [START ON 07/21/2022] levothyroxine  50 mcg Oral Q48H   levothyroxine  75 mcg Oral Q48H   losartan  50 mg Oral Daily   methylPREDNISolone (SOLU-MEDROL) injection  40 mg Intravenous Q12H   pantoprazole  40 mg Oral Daily   rosuvastatin  5 mg Oral Daily   Continuous Infusions:  Procedures/Studies: DG Chest 2 View  Result Date: 07/19/2022 CLINICAL DATA:  Shortness of breath EXAM: CHEST - 2 VIEW COMPARISON:  X-ray 04/25/2021 and older FINDINGS: Hyperinflation. Normal cardiopericardial silhouette with calcified aorta. No consolidation, pneumothorax or effusion. No edema. There is a nodular density overlying the left lung apex overlying the course of the anterior aspect of the left first rib. Uncertain etiology. This was not clearly seen previously. Recommend follow up CT scan or apical lordotic film to further assess. Surgical clips in the right axillary region. Curvature and degenerative changes of the spine. IMPRESSION: Hyperinflation. Faint nodular focus along the left upper lung uncertain etiology. Recommend follow up apical lordotic x-ray or follow up CT scan when appropriate  Electronically Signed   By: Karen Kays M.D.   On: 07/19/2022 16:07    Catarina Hartshorn, DO  [facility]  If 7PM-7AM, please contact night-coverage [email or web address] Password [email or web address] 07/20/2022, 8:26 AM   LOS: 0 days

## 2022-07-20 NOTE — Progress Notes (Addendum)
Responded to nursing call:  cp, sob after walking around room and to BR Tele called for bigeminy I came to bedside to evaluate patient  Subjective: Now resting comfortably in bed.  CP improved, lasted about 5-10 min.  Sob improved.  Denies n/v dizziness  Vitals:   07/20/22 0846 07/20/22 0850 07/20/22 1409 07/20/22 1412  BP:   (!) 157/73   Pulse:   88   Resp:   20   Temp:   98.4 F (36.9 C)   TempSrc:   Oral   SpO2: 92% 98% 93% 94%  Weight:      Height:       CV--RRR Lung--bibasilar rales.  No wheeze.  Diminished BS Abd--soft+BS/NT   Assessment/Plan:  Chest pain -I personally obtained VS--HR 84--RR18--147/60--93% on RA -obtain EKG -troponins x 2 -personally reviewed telemetry--no bradycardia.  Predominant baseline artifact--no bigeminy noted.  Predominant sinus rhythm -placed pt on [place] oxygen for comfort    Catarina Hartshorn, DO [facility]  ADDENDUM:  --troponins 38>>34 --personally reviewed EKG--sinus, no STT changes --CT chest>>Advanced emphysematous lung changes. There are some areas of bronchial wall thickening and some bronchial debris in the left lower lobe.   The finding by x-ray appears to correspond to some cortical thickening along the anterior aspect of the left first rib. No separate underlying lung lesion.

## 2022-07-20 NOTE — TOC Initial Note (Signed)
Transition of Care [facility]) - Initial/Assessment Note    Patient Details  Name: Elaine [name] MRN: [id number] Date of Birth: 1945-04-03  Transition of Care [facility]) CM/SW Contact:    Catalina Gravel, LCSW Phone Number: 07/20/2022, 5:48 PM  Clinical Narrative:                 .. Transition of Care [facility]) Screening Note   Patient Details  Name: Elaine [name] Date of Birth: 01-12-1946   Transition of Care [facility]) CM/SW Contact:    Catalina Gravel, LCSW Phone Number: 07/20/2022, 5:48 PM    Transition of Care Department [facility]) has reviewed patient and no TOC needs have been identified at this time. We will continue to monitor patient advancement through interdisciplinary progression rounds. If new patient transition needs arise, please place a TOC consult.      Barriers to Discharge: Continued Medical Work up   Patient Goals and CMS Choice            Expected Discharge Plan and Services                                              Prior Living Arrangements/Services                       Activities of Daily Living Home Assistive Devices/Equipment: None ADL Screening (condition at time of admission) Patient's cognitive ability adequate to safely complete daily activities?: Yes Is the patient deaf or have difficulty hearing?: No Does the patient have difficulty seeing, even when wearing glasses/contacts?: No Does the patient have difficulty concentrating, remembering, or making decisions?: No Patient able to express need for assistance with ADLs?: Yes Does the patient have difficulty dressing or bathing?: No Independently performs ADLs?: Yes (appropriate for developmental age) Does the patient have difficulty walking or climbing stairs?: No Weakness of Legs: Both Weakness of Arms/Hands: Both  Permission Sought/Granted                  Emotional Assessment              Admission diagnosis:  COPD exacerbation [J44.1] COPD with acute  exacerbation [J44.1] Acute respiratory failure with hypoxia [J96.01] Patient Active Problem List   Diagnosis Date Noted   Acute on chronic respiratory failure with hypoxia 07/20/2022   Lung nodule 07/20/2022   Elevated troponin 07/20/2022   Essential hypertension 07/20/2022   Mixed hyperlipidemia 07/20/2022   Acquired hypothyroidism 07/20/2022   Acute respiratory failure with hypoxia 07/20/2022   COPD with acute exacerbation 07/19/2022   Hemoptysis 04/25/2021   COPD mixed type 04/25/2021   Breast cancer, right 12/30/2017   Malignant neoplasm of lower-inner quadrant of right breast of female, estrogen receptor positive 11/06/2017   Lobular breast cancer, right 11/06/2017   Emphysema lung 11/06/2017   Genetic testing 11/06/2017   Personal history of breast cancer 11/06/2017   Osteoporosis 11/06/2017   Family history of breast cancer    PCP:  Zachery Dauer, MD Pharmacy:   [facility] [phone] [place], [place] - [address] [place] [address] Phone: [phone] Fax: [phone]     Social Determinants of Health (SDOH) Social History: SDOH Screenings   Food Insecurity: No Food Insecurity (07/19/2022)  Housing: Low Risk  (07/19/2022)  Transportation Needs: No Transportation Needs (07/19/2022)  Utilities: Not  At Risk (07/19/2022)  Tobacco Use: High Risk (07/19/2022)   SDOH Interventions:     Readmission Risk Interventions     No data to display

## 2022-07-20 NOTE — Progress Notes (Addendum)
Tech in room with patient assisting patient to bathroom. Patient states she feels pain in her breast bone described as soreness. Telemetry reading Ventricular Bigeminy which converted back SR and chest pain now resolved. Pt complains with feeling SOB. MD informed. See flowsheet for vitals. Orders placed for potassium and mag IV.

## 2022-07-20 NOTE — Progress Notes (Signed)
Patient up to the bathroom with assist. Patient presents SOB after returning to bed. Telemetry monitor showed ventricular bigeminy while patient was in the restroom and returned to sinus rhythm once back in bed. Patient placed on 2L of 02 due to increased work of breathing. Patient calm and laying back in bed now. Vital Signs stable, patient alert and oriented.

## 2022-07-21 DIAGNOSIS — J9621 Acute and chronic respiratory failure with hypoxia: Secondary | ICD-10-CM | POA: Diagnosis not present

## 2022-07-21 DIAGNOSIS — J9601 Acute respiratory failure with hypoxia: Secondary | ICD-10-CM | POA: Diagnosis not present

## 2022-07-21 DIAGNOSIS — J441 Chronic obstructive pulmonary disease with (acute) exacerbation: Secondary | ICD-10-CM | POA: Diagnosis not present

## 2022-07-21 MED ORDER — HYDROXYZINE HCL 10 MG PO TABS: 10.0000 mg | ORAL_TABLET | Freq: Three times a day (TID) | ORAL | 0 refills | Status: DC | PRN

## 2022-07-21 MED ORDER — GUAIFENESIN ER 600 MG PO TB12: 600.0000 mg | ORAL_TABLET | Freq: Two times a day (BID) | ORAL | Status: AC

## 2022-07-21 MED ORDER — PREDNISONE 10 MG PO TABS: 60.0000 mg | ORAL_TABLET | Freq: Every day | ORAL | 0 refills | Status: DC

## 2022-07-21 MED ORDER — IPRATROPIUM BROMIDE 0.06 % NA SOLN
2.0000 | Freq: Three times a day (TID) | NASAL | Status: DC
  Filled 2022-07-21: qty 15

## 2022-07-21 MED ORDER — IPRATROPIUM BROMIDE 0.06 % NA SOLN: 2.0000 | Freq: Three times a day (TID) | NASAL | 1 refills | Status: DC

## 2022-07-21 MED ORDER — PREDNISONE 20 MG PO TABS: 60.0000 mg | ORAL_TABLET | Freq: Every day | ORAL | Status: DC

## 2022-07-21 MED ORDER — IPRATROPIUM-ALBUTEROL 0.5-2.5 (3) MG/3ML IN SOLN
3.0000 mL | Freq: Three times a day (TID) | RESPIRATORY_TRACT | Status: DC
  Administered 2022-07-21: 3 mL via RESPIRATORY_TRACT
  Filled 2022-07-21 (×2): qty 3

## 2022-07-21 NOTE — TOC Transition Note (Signed)
Transition of Care [facility]) - CM/SW Discharge Note   Patient Details  Name: Elaine [name] MRN: [id number] Date of Birth: [birth year]  Transition [facility] Care [facility]) CM/SW Contact:  Catalina Gravel, LCSW Phone Number: 07/21/2022, 11:52 AM   Clinical Narrative:     Pt clear for DC. CSW followed up with RN and MD on 02 needs. Pt has 02 in place at home and there are no additional TOC referrals needed. DC today.    Barriers to Discharge: No Barriers Identified   Patient Goals and CMS Choice      Discharge Placement                         Discharge Plan and Services Additional resources added to the After Visit Summary for                                       Social Determinants of Health (SDOH) Interventions SDOH Screenings   Food Insecurity: No Food Insecurity (07/19/2022)  Housing: Low Risk  (07/19/2022)  Transportation Needs: No Transportation Needs (07/19/2022)  Utilities: Not At Risk (07/19/2022)  Tobacco Use: High Risk (07/19/2022)     Readmission Risk Interventions     No data to display

## 2022-07-21 NOTE — Discharge Summary (Addendum)
Physician Discharge Summary   Patient: Elaine [name] MRN: [id number] DOB: 1945/10/05  Admit date:     07/19/2022  Discharge date: 07/21/22  Discharge Physician: Onalee Hua Koda Routon   PCP: Zachery Dauer, MD   Recommendations at discharge:   Please follow up with primary care provider within 1-2 weeks  Please repeat BMP and CBC in one week     Hospital Course: 77 year old female with a history of COPD, chronic respiratory failure on 2 L at nighttime, hypertension, hyperlipidemia, hypothyroidism presenting with 2-week history of sinus and chest congestion, cough, shortness of breath.  She stated that her shortness of breath had gradually worsened with her congestion.  She contacted her pulmonologist on 07/17/2022.  The patient was given a prescription for prednisone.  The patient normally takes azithromycin 250 mg on Monday, Wednesdays, and Fridays year-round.  She began taking the azithromycin daily since 07/17/2022.  She had some subjective night sweats.  She denies any frank fevers or chills.  She denies any headache, chest pain, hemoptysis, nausea, vomiting, diarrhea, abdominal pain.  She quit smoking November 2019, but has since restarted.  She has a 50-pack-year history.  She continues to smoke 1/2 pack/day.  Notably, the patient has some type of heart monitor and pulse oximeter at home.  She noted that her oxygen saturation was less than 90 and her heart rate in the 30s at home. In the ED, the patient was afebrile and hemodynamically stable with oxygen saturation 92-93% on 2 L.  WBC 9.3, hemoglobin 15.9, platelets 267,000.  Sodium 140, potassium 3.6, bicarbonate 22, serum creatinine 0.78.  LFTs were unremarkable.  Troponin 35>> 42>> 57>> 53.  She was started on IV Solu-Medrol and bronchodilators. She improved clinically, and she was weaned off of oxygen.  She continued to use as needed oxygen with exertion when she began having a little shortness of breath.  However, ambulatory pulse oximetry on the day  of discharge on room air and did not show desaturation <92%.   Assessment and Plan: Acute on chronic respiratory failure with hypoxia -Secondary to COPD exacerbation -Wean oxygen back to baseline -Chronically on 2 L at nighttime -personally reviewed CXR--no infiltrates or edema -Advanced emphysematous lung changes. There are some areas of bronchial wall thickening and some bronchial debris in the left lower lobe. -Patient was weaned to room air -ambulatory pulse oximetry on the day of discharge on room air and did not show desaturation <92%.   COPD Exacerbation -Started Pulmicort -Started Brovana -Continue IV Solu-Medrol>>dc home with prednisone taper -Continue DuoNebs -viral respiratory panel--neg  -Patient instructed to take azithromycin daily for 3 additional days after discharge   Elevated troponin -Secondary to demand ischemia -No chest pain presently -Personally reviewed EKG--sinus rhythm, nonspecific T wave changes, no high-grade AV block   Reported history of bradycardia -Patient reported bradycardia at home -Suspect this is likely due to artifact -Personally reviewed telemetry during hospitalization presently--patient has lots of artifact; no bradycardia, no high-grade AV block -Remain on telemetry--throughout the hospitalization, the patient did not exhibit any high degree AV block.  Her lowest heart rate was noted to be 59 at rest, and there was no chronotropic incompetence with ambulation   Essential hypertension -Continue losartan   Mixed hyperlipidemia -Continue statin   Hypothyroidism -Continue Synthroid   Nodular opacity -Noted on chest x-ray -CT chest--The finding by x-ray appears to correspond to some cortical thickening along the anterior aspect of the left first rib. No separate underlying lung lesion.  Anxiety -The patient was  given a trial of Atarax which she stated was efficacious -Prescription was provided for Atarax at the time of discharge         Consultants: none Procedures performed: none  Disposition: Home Diet recommendation:  Cardiac diet DISCHARGE MEDICATION: Allergies as of 07/21/2022       Reactions   Bee Venom Other (See Comments)   Unknown   Codeine Nausea Only   Macrobid [nitrofurantoin Macrocrystal] Other (See Comments)   Unknown   Morphine And Related Other (See Comments)   Unknown   Oxycodone Nausea Only   Penicillins Other (See Comments)   Unknown Has patient had a PCN reaction causing immediate rash, facial/tongue/throat swelling, SOB or lightheadedness with hypotension: Unknown Has patient had a PCN reaction causing severe rash involving mucus membranes or skin necrosis: Unknown Has patient had a PCN reaction that required hospitalization: Unknown Has patient had a PCN reaction occurring within the last 10 years: No If all of the above answers are "NO", then may proceed with Cephalosporin use.   Tyloxapol Nausea Only, Other (See Comments)   Extreme Drugged feeling, Doesn't affect pain        Medication List     STOP taking these medications    HYDROcodone-acetaminophen 5-325 MG tablet Commonly known as: NORCO/VICODIN   metroNIDAZOLE 500 MG tablet Commonly known as: FLAGYL       TAKE these medications    aspirin EC 81 MG tablet Take 81 mg by mouth daily. Swallow whole.   azithromycin 250 MG tablet Commonly known as: ZITHROMAX Take 1 tablet (250 mg total) by mouth every Monday, Wednesday, and Friday.   guaiFENesin 600 MG 12 hr tablet Commonly known as: MUCINEX Take 1 tablet (600 mg total) by mouth 2 (two) times daily.   hydrOXYzine 10 MG tablet Commonly known as: ATARAX Take 1 tablet (10 mg total) by mouth 3 (three) times daily as needed for anxiety.   ipratropium 0.06 % nasal spray Commonly known as: ATROVENT Place 2 sprays into both nostrils 3 (three) times daily.   ipratropium-albuterol 0.5-2.5 (3) MG/3ML Soln Commonly known as: DUONEB Inhale 3 mLs into the lungs  See admin instructions. Use 1 vial via nebulizer daily. May use additional vial if needed at bedtime for shortness of breath. What changed: when to take this   levothyroxine 50 MCG tablet Commonly known as: SYNTHROID Take 50 mcg by mouth every other day.   Synthroid 75 MCG tablet Generic drug: levothyroxine Take 75 mcg by mouth every other day.   losartan 50 MG tablet Commonly known as: COZAAR Take 50 mg by mouth daily.   OXYGEN Inhale 2 L into the lungs continuous.   predniSONE 10 MG tablet Commonly known as: DELTASONE Take 6 tablets (60 mg total) by mouth daily with breakfast. And decrease by one tablet daily Start taking on: July 22, 2022 What changed: See the new instructions.   rosuvastatin 5 MG tablet Commonly known as: CRESTOR Take 5 mg by mouth daily.   Trelegy Ellipta 200-62.5-25 MCG/ACT Aepb Generic drug: Fluticasone-Umeclidin-Vilant Inhale 1 puff into the lungs daily.   Ventolin HFA 108 (90 Base) MCG/ACT inhaler Generic drug: albuterol INHALE 2 PUFFS BY MOUTH EVERY 4 HOURS AS NEEDED FOR WHEEZE OR FOR SHORTNESS OF BREATH        Discharge Exam: Filed Weights   07/19/22 1528  Weight: 62.6 kg   HEENT:  [place]/AT, No thrush, no icterus CV:  RRR, no rub, no S3, no S4 Lung: Diminished breath sounds.  Bibasilar rales.  No wheezing. Abd:  soft/+BS, NT Ext:  No edema, no lymphangitis, no synovitis, no rash   Condition at discharge: stable  The results of significant diagnostics from this hospitalization (including imaging, microbiology, ancillary and laboratory) are listed below for reference.   Imaging Studies: CT CHEST WO CONTRAST  Result Date: 07/20/2022 CLINICAL DATA:  Dyspnea. EXAM: CT CHEST WITHOUT CONTRAST TECHNIQUE: Multidetector CT imaging of the chest was performed following the standard protocol without IV contrast. RADIATION DOSE REDUCTION: This exam was performed according to the departmental dose-optimization program which includes automated  exposure control, adjustment of the mA and/or kV according to patient size and/or use of iterative reconstruction technique. COMPARISON:  X-ray 07/19/2022 and older.  Prior CT scan 04/17/2021 FINDINGS: Cardiovascular: Normal caliber thoracic aorta with some mild atherosclerotic calcified plaque on this non IV contrast exam. The heart is nonenlarged. Small pericardial effusion. Coronary artery calcifications are seen. Mediastinum/Nodes: Small hiatal hernia. Small thyroid gland. No abnormal lymph node enlargement seen in the axillary regions, hilum or mediastinum. Lungs/Pleura: Advanced centrilobular emphysematous lung changes identified. No consolidation, pneumothorax or effusion. Basilar atelectasis. The areas of some bronchial wall thickening along left lower lobe with some luminal debris along small subsegmental bronchi. No discrete lung mass. There is slight cortical thickening along the inferior aspect of the anterior aspect of the left first rib which may correspond to the finding by x-ray. No additional lung lesion. On the prior CT scan with a small nodule along the left lower lobe which is no longer seen and may have been infectious or inflammatory. Upper Abdomen: Right adrenal gland is preserved. Left is slightly nodular and unchanged from previous. No specific follow-up. Musculoskeletal: Curvature and degenerative changes along the spine. IMPRESSION: Advanced emphysematous lung changes. There are some areas of bronchial wall thickening and some bronchial debris in the left lower lobe. The finding by x-ray appears to correspond to some cortical thickening along the anterior aspect of the left first rib. No separate underlying lung lesion. Small hiatal hernia. Aortic Atherosclerosis (ICD10-I70.0) and Emphysema (ICD10-J43.9). Electronically Signed   By: Karen Kays M.D.   On: 07/20/2022 17:10   DG Chest 2 View  Result Date: 07/19/2022 CLINICAL DATA:  Shortness of breath EXAM: CHEST - 2 VIEW COMPARISON:   X-ray 04/25/2021 and older FINDINGS: Hyperinflation. Normal cardiopericardial silhouette with calcified aorta. No consolidation, pneumothorax or effusion. No edema. There is a nodular density overlying the left lung apex overlying the course of the anterior aspect of the left first rib. Uncertain etiology. This was not clearly seen previously. Recommend follow up CT scan or apical lordotic film to further assess. Surgical clips in the right axillary region. Curvature and degenerative changes of the spine. IMPRESSION: Hyperinflation. Faint nodular focus along the left upper lung uncertain etiology. Recommend follow up apical lordotic x-ray or follow up CT scan when appropriate Electronically Signed   By: Karen Kays M.D.   On: 07/19/2022 16:07    Microbiology: Results for orders placed or performed during the hospital encounter of 07/19/22  Respiratory (~20 pathogens) panel by PCR     Status: None   Collection Time: 07/20/22  9:17 AM   Specimen: Nasopharyngeal Swab; Respiratory  Result Value Ref Range Status   Adenovirus NOT DETECTED NOT DETECTED Final   Coronavirus 229E NOT DETECTED NOT DETECTED Final    Comment: (NOTE) The Coronavirus on the Respiratory Panel, DOES NOT test for the novel  Coronavirus (2019 nCoV)    Coronavirus HKU1 NOT DETECTED NOT DETECTED Final  Coronavirus NL63 NOT DETECTED NOT DETECTED Final   Coronavirus OC43 NOT DETECTED NOT DETECTED Final   Metapneumovirus NOT DETECTED NOT DETECTED Final   Rhinovirus / Enterovirus NOT DETECTED NOT DETECTED Final   Influenza A NOT DETECTED NOT DETECTED Final   Influenza B NOT DETECTED NOT DETECTED Final   Parainfluenza Virus 1 NOT DETECTED NOT DETECTED Final   Parainfluenza Virus 2 NOT DETECTED NOT DETECTED Final   Parainfluenza Virus 3 NOT DETECTED NOT DETECTED Final   Parainfluenza Virus 4 NOT DETECTED NOT DETECTED Final   Respiratory Syncytial Virus NOT DETECTED NOT DETECTED Final   Bordetella pertussis NOT DETECTED NOT DETECTED  Final   Bordetella Parapertussis NOT DETECTED NOT DETECTED Final   Chlamydophila pneumoniae NOT DETECTED NOT DETECTED Final   Mycoplasma pneumoniae NOT DETECTED NOT DETECTED Final    Comment: Performed at [facility], [address], [place], [place] [address]    Labs: CBC: Recent Labs  Lab 07/19/22 1640 07/20/22 0331  WBC 9.3 11.1*  NEUTROABS 8.3*  --   HGB 15.9* 15.5*  HCT 46.4* 45.1  MCV 93.0 92.6  PLT 267 258   Basic Metabolic Panel: Recent Labs  Lab 07/19/22 1640 07/20/22 0331  NA 139 140  K 4.7 3.6  CL 104 108  CO2 26 22  GLUCOSE 138* 142*  BUN 18 21  CREATININE 0.74 0.78  CALCIUM 9.5 8.9  MG  --  1.9  PHOS  --  3.5   Liver Function Tests: Recent Labs  Lab 07/19/22 1640 07/20/22 0331  AST 21 22  ALT 17 15  ALKPHOS 63 58  BILITOT 0.7 0.7  PROT 7.2 6.7  ALBUMIN 4.4 4.0   CBG: No results for input(s): "GLUCAP" in the last 168 hours.  Discharge time spent: greater than 30 minutes.  Signed: Catarina Hartshorn, MD [facility] 07/21/2022

## 2022-07-23 NOTE — Telephone Encounter (Signed)
FYI. Pt discharged from hospital for COPD exacerbation.

## 2022-09-02 ENCOUNTER — Ambulatory Visit: Payer: Medicare Other | Admitting: Urology

## 2022-09-13 ENCOUNTER — Ambulatory Visit: Payer: Medicare Other | Attending: Internal Medicine | Admitting: Internal Medicine

## 2022-09-13 ENCOUNTER — Encounter: Payer: Self-pay | Admitting: Internal Medicine

## 2022-09-13 VITALS — BP 134/70 | HR 70 | Ht 64.0 in | Wt 145.0 lb

## 2022-09-13 DIAGNOSIS — I471 Supraventricular tachycardia, unspecified: Secondary | ICD-10-CM | POA: Diagnosis present

## 2022-09-13 NOTE — Patient Instructions (Signed)
Medication Instructions:  Your physician recommends that you continue on your current medications as directed. Please refer to the Current Medication list given to you today.   Labwork: None today  Testing/Procedures: None today  Follow-Up: As needed  Any Other Special Instructions Will Be Listed Below (If Applicable).  If you need a refill on your cardiac medications before your next appointment, please call your pharmacy.  

## 2022-09-13 NOTE — Progress Notes (Signed)
Cardiology Office Note  Date: 09/13/2022   ID: Elaine [name], Elaine [name] [birth year], MRN [id number]  PCP:  Zachery Dauer, MD  Cardiologist:  None Electrophysiologist:  None   Reason for Office Visit: Follow-up of paroxysmal A-fib   History of Present Illness: Elaine [name] is a 77 y.o. female known to have COPD on home oxygen at night, HTN is here for follow-up visit.  Patient was initially referred to cardiology clinic in 2023 for evaluation of palpitations.  Her Apple Watch noted atrial fibrillation but this lasted for few seconds to 1 and half minute. I personally reviewed her Apple Watch recordings from last year.  She did not have any recurrence of atrial fibrillation this year. Event monitor from 2023 also showed few episodes of atrial tachycardia versus atrial fibrillation, longest lasting 5 seconds.  She was initially started on diltiazem but due to bradycardia concerns, diltiazem was held.  She was also started on systemic anticoagulation, Eliquis 5 mg twice daily but after she had ER visit in [place] last year for nosebleeds, she self discontinued Eliquis.  She is here for follow-up visit.  No palpitations, dizziness, lightheadedness, severe fatigue, syncope.  No angina or DOE.  She complains of her heart rate dropping down to 50s and sometimes 40s when she coughs.  But no symptoms of dizziness or lightheadedness or syncope.  Past Medical History:  Diagnosis Date   Arrhythmia    Breast cancer (HCC) 1995   left    Cataract    Complication of anesthesia    Emphysema of lung (HCC)    Family history of breast cancer    Hiatal hernia 1992   Hypertension    Hyperthyroidism    Osteoporosis    PONV (postoperative nausea and vomiting)     Past Surgical History:  Procedure Laterality Date   BRONCHIAL WASHINGS  05/01/2021   Procedure: BRONCHIAL WASHINGS;  Surgeon: Luciano Cutter, MD;  Location: [facility];  Service: Cardiopulmonary;;   CATARACT EXTRACTION     MASTECTOMY  Left 1995   MASTECTOMY W/ SENTINEL NODE BIOPSY Right 12/30/2017   MASTECTOMY W/ SENTINEL NODE BIOPSY Right 12/30/2017   Procedure: MASTECTOMY WITH SENTINEL LYMPH NODE BIOPSY;  Surgeon: Emelia Loron, MD;  Location: [facility];  Service: General;  Laterality: Right;   TOTAL ABDOMINAL HYSTERECTOMY  06/22/1993   VIDEO BRONCHOSCOPY N/A 05/01/2021   Procedure: VIDEO BRONCHOSCOPY WITHOUT FLUORO;  Surgeon: Luciano Cutter, MD;  Location: [facility];  Service: Cardiopulmonary;  Laterality: N/A;    Current Outpatient Medications  Medication Sig Dispense Refill   aspirin 81 MG EC tablet Take 81 mg by mouth daily. Swallow whole.     ciprofloxacin (CIPRO) 500 MG tablet Take 500 mg by mouth 2 (two) times daily.     denosumab (PROLIA) 60 MG/ML SOSY injection Inject 60 mg into the skin every 6 (six) months.     Fluticasone-Umeclidin-Vilant (TRELEGY ELLIPTA) 200-62.5-25 MCG/ACT AEPB Inhale 1 puff into the lungs daily. 180 each 3   guaiFENesin (MUCINEX) 600 MG 12 hr tablet Take 1 tablet (600 mg total) by mouth 2 (two) times daily.     ipratropium (ATROVENT) 0.06 % nasal spray Place 2 sprays into both nostrils 3 (three) times daily. 15 mL 1   ipratropium-albuterol (DUONEB) 0.5-2.5 (3) MG/3ML SOLN Inhale 3 mLs into the lungs See admin instructions. Use 1 vial via nebulizer daily. May use additional vial if needed at bedtime for shortness of breath. (Patient taking differently: Inhale 3 mLs into the lungs  2 (two) times daily. Use 1 vial via nebulizer daily. May use additional vial if needed at bedtime for shortness of breath.) 360 mL 3   levothyroxine (SYNTHROID) 50 MCG tablet Take 50 mcg by mouth every other day.     losartan (COZAAR) 50 MG tablet Take 50 mg by mouth daily.     ondansetron (ZOFRAN) 4 MG tablet Take 4 mg by mouth.     OXYGEN Inhale 2 L into the lungs continuous.     rosuvastatin (CRESTOR) 5 MG tablet Take 5 mg by mouth daily.     SYNTHROID 75 MCG tablet Take 75 mcg by mouth every other day.      VENTOLIN HFA 108 (90 Base) MCG/ACT inhaler INHALE 2 PUFFS BY MOUTH EVERY 4 HOURS AS NEEDED FOR WHEEZE OR FOR SHORTNESS OF BREATH 54 each 1   azithromycin (ZITHROMAX) 250 MG tablet Take 1 tablet (250 mg total) by mouth every Monday, Wednesday, and Friday. (Patient not taking: Reported on 07/20/2022) 39 tablet 3   hydrOXYzine (ATARAX) 10 MG tablet Take 1 tablet (10 mg total) by mouth 3 (three) times daily as needed for anxiety. 30 tablet 0   predniSONE (DELTASONE) 10 MG tablet Take 6 tablets (60 mg total) by mouth daily with breakfast. And decrease by one tablet daily 21 tablet 0   No current facility-administered medications for this visit.   Allergies:  Bee venom, Codeine, Macrobid [nitrofurantoin macrocrystal], Morphine and codeine, Oxycodone, Penicillins, and Tyloxapol   Social History: The patient  reports that she has been smoking cigarettes. She has a 12.50 pack-year smoking history. She has never used smokeless tobacco. She reports that she does not currently use alcohol. She reports that she does not use drugs.   Family History: The patient's family history includes Aneurysm in her mother; Breast cancer in her cousin and cousin; Breast cancer (age of onset: 74) in her cousin; Diabetes in her sister and sister; Heart attack in her father.   ROS:  Please see the history of present illness. Otherwise, complete review of systems is positive for none.  All other systems are reviewed and negative.   Physical Exam: VS:  BP 134/70   Pulse 70   Ht 5\' 4"  (1.626 m)   Wt 145 lb (65.8 kg)   SpO2 96%   BMI 24.89 kg/m , BMI Body mass index is 24.89 kg/m.  Wt Readings from Last 3 Encounters:  09/13/22 145 lb (65.8 kg)  07/19/22 138 lb (62.6 kg)  06/10/22 142 lb 9.6 oz (64.7 kg)    General: Patient appears comfortable at rest. HEENT: Conjunctiva and lids normal, oropharynx clear with moist mucosa. Neck: Supple, no elevated JVP or carotid bruits, no thyromegaly. Lungs: Clear to auscultation,  nonlabored breathing at rest. Cardiac: Regular rate and rhythm, no S3 or significant systolic murmur, no pericardial rub. Abdomen: Soft, nontender, no hepatomegaly, bowel sounds present, no guarding or rebound. Extremities: No pitting edema, distal pulses 2+. Skin: Warm and dry. Musculoskeletal: No kyphosis. Neuropsychiatric: Alert and oriented x3, affect grossly appropriate.  Recent Labwork: 07/20/2022: ALT 15; AST 22; BUN 21; Creatinine, Ser 0.78; Hemoglobin 15.5; Magnesium 1.9; Platelets 258; Potassium 3.6; Sodium 140  No results found for: "CHOL", "TRIG", "HDL", "CHOLHDL", "VLDL", "LDLCALC", "LDLDIRECT"   Assessment and Plan: Patient is a 77 year old F known to have COPD on home oxygen at night, HTN is here for follow-up visit.  # History of paroxysmal atrial fibrillation/pSVT -I personally reviewed the EKG strips on her Apple Watch from last year  that showed brief episodes of atrial fibrillation lasting from few seconds to 1 and half minute. Event monitor from 2023 showed brief episodes of atrial tachycardia versus paroxysmal A-fib, 2 episodes in 14 days and longest duration was 5 seconds.  She did not have any recurrence of atrial fibrillation as notified on her Apple Watch this year. She was initially started on diltiazem but later discontinued due to bradycardia concerns. But she never had symptoms of dizziness, lightheadedness and syncope. She had chronotropic competence in the 4/24 hospitalization.  Due to no evidence of atrial fibrillation lasting for more than 6 minutes, will remove atrial fibrillation from her diagnosis. She does not have to be on rate controlling agents or systemic anticoagulation. She self discontinued Eliquis when she had nosebleeds last year anyway. -Echocardiogram and NM stress test from 2023 showed normal LVEF, no valvular heart disease and no evidence of ischemia.  # HTN, controlled -Continue losartan 50 mg once daily, follow up with PCP   I have spent a  total of 30 minutes with patient reviewing chart, EKGs, labs and examining patient as well as establishing an assessment and plan that was discussed with the patient.  > 50% of time was spent in direct patient care.    Medication Adjustments/Labs and Tests Ordered: Current medicines are reviewed at length with the patient today.  Concerns regarding medicines are outlined above.   Tests Ordered: No orders of the defined types were placed in this encounter.   Medication Changes: No orders of the defined types were placed in this encounter.   Disposition:  Follow up prn  Signed, Kasin Tonkinson Verne Spurr, MD, 09/13/2022 12:06 PM    [facility] [address] [place], [place] [address]

## 2022-09-24 ENCOUNTER — Telehealth (HOSPITAL_BASED_OUTPATIENT_CLINIC_OR_DEPARTMENT_OTHER): Payer: Self-pay | Admitting: Pulmonary Disease

## 2022-09-24 DIAGNOSIS — J432 Centrilobular emphysema: Secondary | ICD-10-CM

## 2022-09-24 DIAGNOSIS — G4734 Idiopathic sleep related nonobstructive alveolar hypoventilation: Secondary | ICD-10-CM

## 2022-09-24 NOTE — Telephone Encounter (Signed)
Patient states she needs an order sent to [facility] in [place] for POC when needed for trips/ vacation. Please advise.

## 2022-09-27 ENCOUNTER — Telehealth: Payer: Self-pay | Admitting: Pulmonary Disease

## 2022-09-27 NOTE — Telephone Encounter (Signed)
Pt calling in to get a order sent in for a POC bc she is going on vacation

## 2022-09-27 NOTE — Telephone Encounter (Signed)
Called and spoke with patient. She is requesting a POC to take her with on trips. Confirmed that she is using 2L of O2. I advised her that I would go ahead and send in the order, she verbalized understanding.   Nothing further needed at time of call.

## 2022-10-08 IMAGING — DX DG HIP (WITH OR WITHOUT PELVIS) 2-3V*L*
3 series · 3 of 3 positions shown · non-contrast
Comparison: None

CLINICAL DATA: Fell on 11/27/2020 onto LEFT hip, worsened posterior
LEFT hip pain

EXAM:
DG HIP (WITH OR WITHOUT PELVIS) 2-3V LEFT

[pelvis ap]
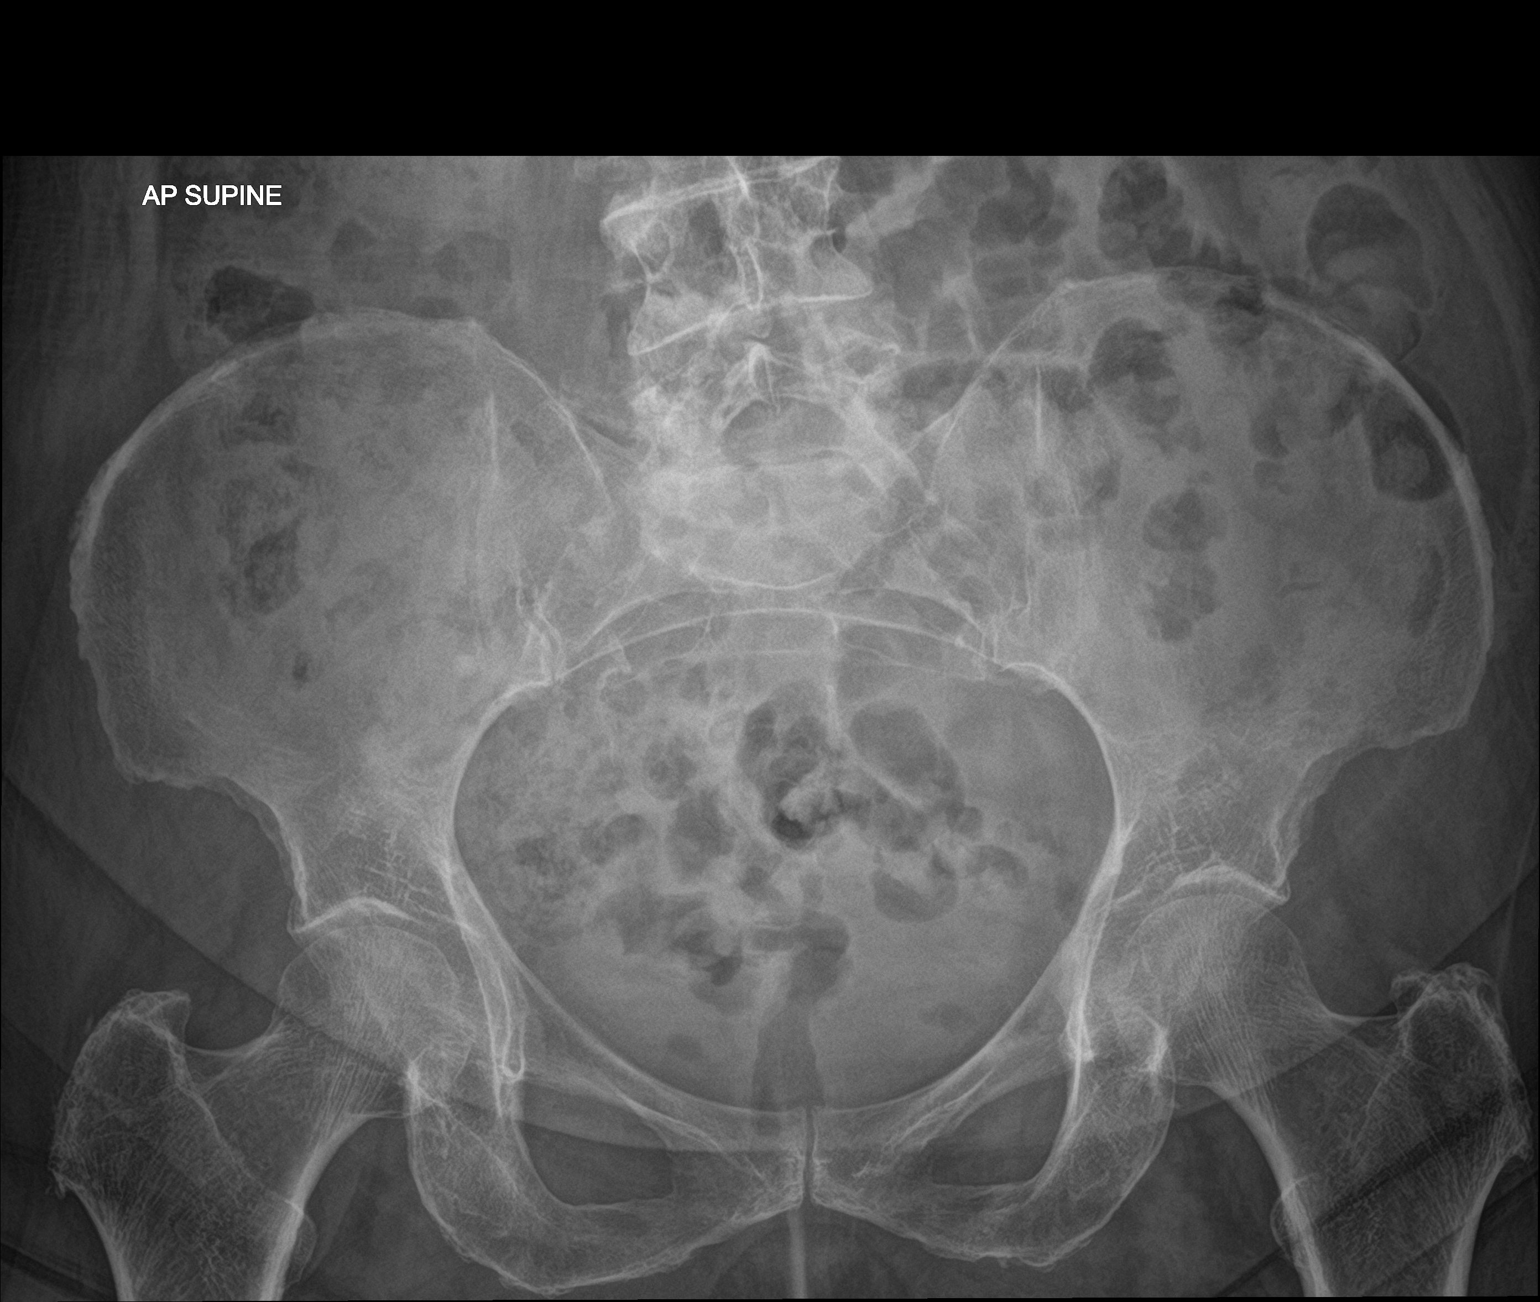

[hip ap]
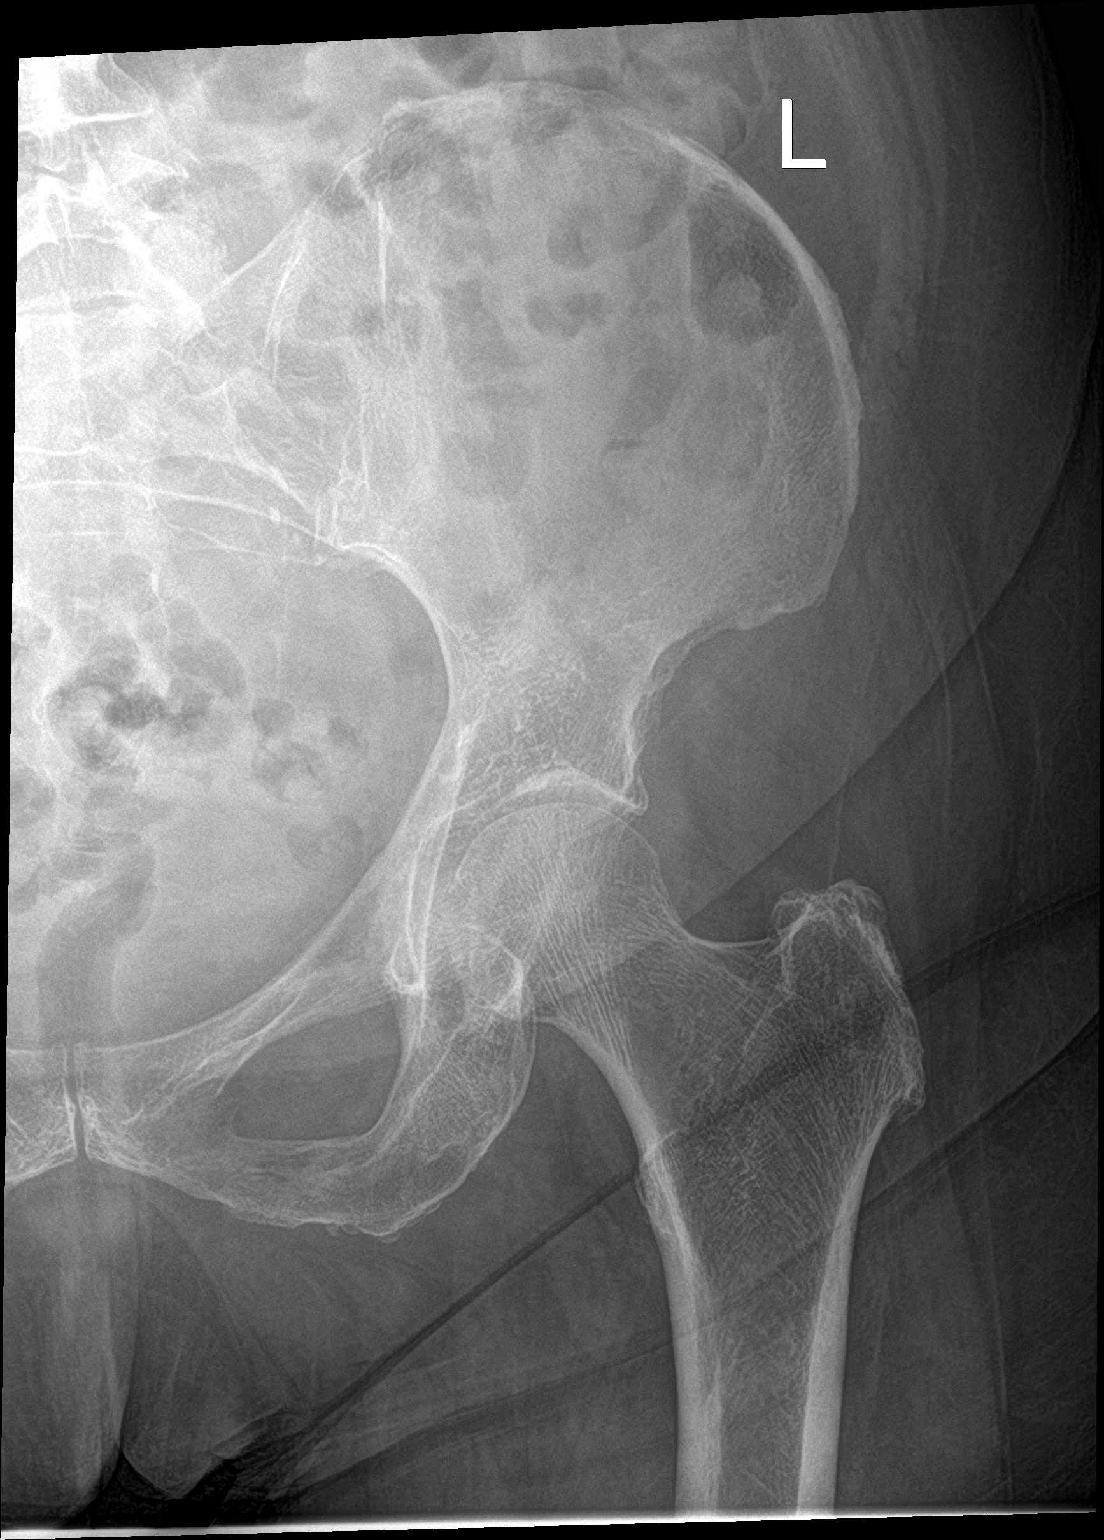

[hip lat]
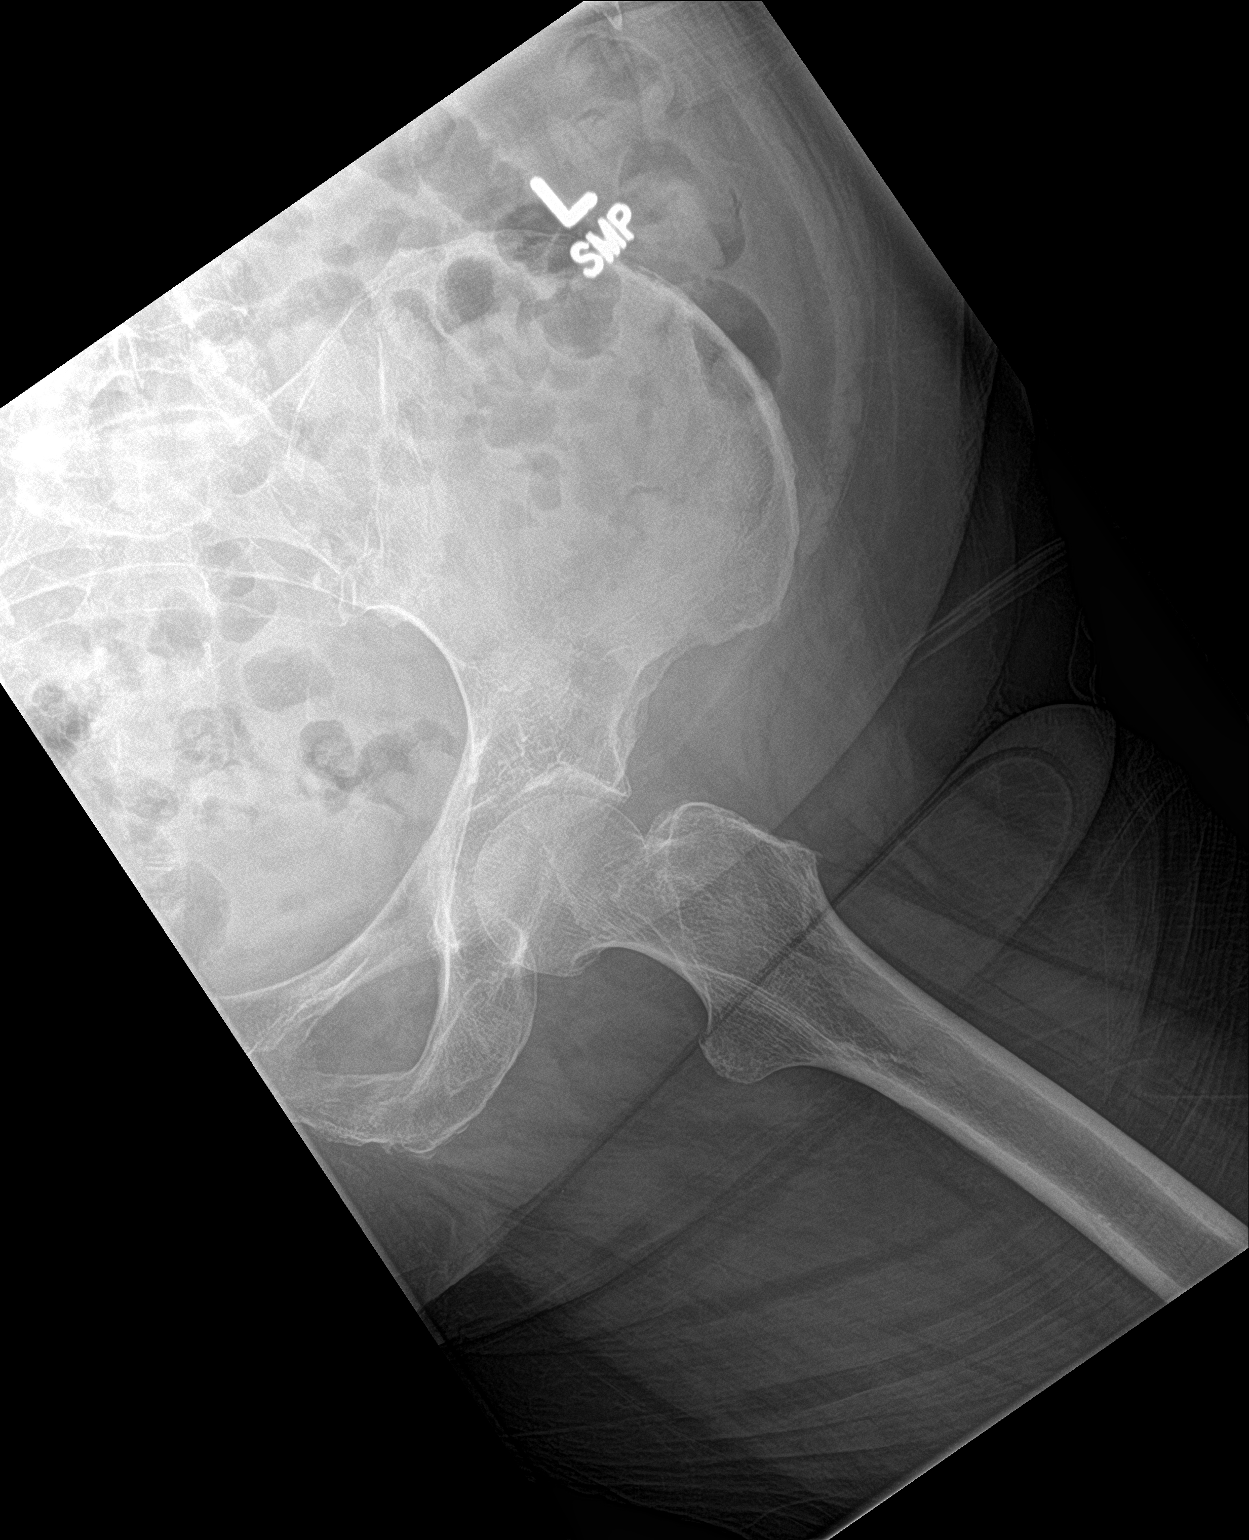

[3 of 3 positions shown; findings below may reference images not displayed]

FINDINGS: Osseous demineralization.

Hip and SI joint spaces preserved.

No acute fracture, dislocation, or bone destruction.
IMPRESSION: No acute osseous abnormalities.

## 2022-10-19 ENCOUNTER — Encounter (HOSPITAL_BASED_OUTPATIENT_CLINIC_OR_DEPARTMENT_OTHER): Payer: Self-pay | Admitting: Pulmonary Disease

## 2022-11-15 ENCOUNTER — Ambulatory Visit (INDEPENDENT_AMBULATORY_CARE_PROVIDER_SITE_OTHER): Payer: Medicare Other | Admitting: Pulmonary Disease

## 2022-11-15 ENCOUNTER — Encounter (HOSPITAL_BASED_OUTPATIENT_CLINIC_OR_DEPARTMENT_OTHER): Payer: Self-pay | Admitting: Pulmonary Disease

## 2022-11-15 VITALS — BP 124/64 | HR 64 | Ht 64.0 in | Wt 142.0 lb

## 2022-11-15 DIAGNOSIS — J432 Centrilobular emphysema: Secondary | ICD-10-CM

## 2022-11-15 DIAGNOSIS — G4734 Idiopathic sleep related nonobstructive alveolar hypoventilation: Secondary | ICD-10-CM | POA: Diagnosis not present

## 2022-11-15 NOTE — Progress Notes (Signed)
Subjective:   PATIENT ID: Elaine [name] GENDER: female DOB: 05-Aug-1945, MRN: [id number]   HPI  Chief Complaint  Patient presents with   Emphysema   [name] is a 77 year-old female with hx right breast cancer s/p mastectomy who presents for COPD follow-up  2019 - Established care with [facility] in November. On Anoro and chronic macrolide 2020 - Outpatient exacerbation Dec 2021 - Urgent care for COPD exacerbation in July 2022 - Hospitalized for COPD exacerbation in June 2023 - PNA followed by recurrent hemoptysis > bronchoscopy 05/01/21 demonstrated old blood clot occluding subsegment of left upper lobe which was aspirated. Outpatient COPD exacerbation in Feb and April and May. Hospitalized in July. 2024 - Well controlled COPD on Trelegy and chronic macrolide. Nocturnal oxygen recertified in June  11/15/22 Since our last she reports her COPD overall controlled on Trelegy. Very rarely uses albuterol up to once a month. Denies shortness of breath, cough or wheezing except for occasional reflux. She still performs her own yardwork. She will rest more often especially if it's hot but usually able to pick up and finish her steroids.   Social: 50 pack year history. Quit smoking 01/2018. Currently smoking  Past Medical History:  Diagnosis Date   Arrhythmia    Breast cancer (HCC) 1995   left    Cataract    Complication of anesthesia    Emphysema of lung (HCC)    Family history of breast cancer    Hiatal hernia 1992   Hypertension    Hyperthyroidism    Osteoporosis    PONV (postoperative nausea and vomiting)       Social History   Occupational History   Occupation: Airline pilot  Tobacco Use   Smoking status: Every Day    Current packs/day: 0.25    Average packs/day: 0.3 packs/day for 50.0 years (12.5 ttl pk-yrs)    Types: Cigarettes   Smokeless tobacco: Never   Tobacco comments:    2 cigarettes per day-- ARJ 10/15/21  Vaping Use   Vaping status: Never Used   Substance and Sexual Activity   Alcohol use: Not Currently    Comment: Martini daily   Drug use: Never   Sexual activity: Not on file    Allergies  Allergen Reactions   Bee Venom Other (See Comments)    Unknown   Codeine Nausea Only   Macrobid [Nitrofurantoin Macrocrystal] Other (See Comments)    Unknown   Morphine And Codeine Other (See Comments)    Unknown   Oxycodone Nausea Only   Penicillins Other (See Comments)    Unknown Has patient had a PCN reaction causing immediate rash, facial/tongue/throat swelling, SOB or lightheadedness with hypotension: Unknown Has patient had a PCN reaction causing severe rash involving mucus membranes or skin necrosis: Unknown Has patient had a PCN reaction that required hospitalization: Unknown Has patient had a PCN reaction occurring within the last 10 years: No If all of the above answers are "NO", then may proceed with Cephalosporin use.    Tyloxapol Nausea Only and Other (See Comments)    Extreme Drugged feeling, Doesn't affect pain     Outpatient Medications Prior to Visit  Medication Sig Dispense Refill   aspirin 81 MG EC tablet Take 81 mg by mouth daily. Swallow whole.     azithromycin (ZITHROMAX) 250 MG tablet Take 1 tablet (250 mg total) by mouth every Monday, Wednesday, and Friday. 39 tablet 3   ciprofloxacin (CIPRO) 500 MG tablet Take 500 mg by mouth  2 (two) times daily.     denosumab (PROLIA) 60 MG/ML SOSY injection Inject 60 mg into the skin every 6 (six) months.     Fluticasone-Umeclidin-Vilant (TRELEGY ELLIPTA) 200-62.5-25 MCG/ACT AEPB Inhale 1 puff into the lungs daily. 180 each 3   guaiFENesin (MUCINEX) 600 MG 12 hr tablet Take 1 tablet (600 mg total) by mouth 2 (two) times daily.     ipratropium (ATROVENT) 0.06 % nasal spray Place 2 sprays into both nostrils 3 (three) times daily. 15 mL 1   ipratropium-albuterol (DUONEB) 0.5-2.5 (3) MG/3ML SOLN Inhale 3 mLs into the lungs See admin instructions. Use 1 vial via nebulizer  daily. May use additional vial if needed at bedtime for shortness of breath. (Patient taking differently: Inhale 3 mLs into the lungs 2 (two) times daily. Use 1 vial via nebulizer daily. May use additional vial if needed at bedtime for shortness of breath.) 360 mL 3   levothyroxine (SYNTHROID) 50 MCG tablet Take 50 mcg by mouth every other day. BRAND ONLY     losartan (COZAAR) 50 MG tablet Take 50 mg by mouth daily.     OXYGEN Inhale 2 L into the lungs continuous.     rosuvastatin (CRESTOR) 5 MG tablet Take 5 mg by mouth daily.     SYNTHROID 75 MCG tablet Take 75 mcg by mouth every other day. BRAND ONLY     VENTOLIN HFA 108 (90 Base) MCG/ACT inhaler INHALE 2 PUFFS BY MOUTH EVERY 4 HOURS AS NEEDED FOR WHEEZE OR FOR SHORTNESS OF BREATH 54 each 1   hydrOXYzine (ATARAX) 10 MG tablet Take 1 tablet (10 mg total) by mouth 3 (three) times daily as needed for anxiety. 30 tablet 0   ondansetron (ZOFRAN) 4 MG tablet Take 4 mg by mouth.     No facility-administered medications prior to visit.    Review of Systems  Constitutional:  Negative for chills, diaphoresis, fever, malaise/fatigue and weight loss.  HENT:  Negative for congestion.   Respiratory:  Negative for cough, hemoptysis, sputum production, shortness of breath and wheezing.   Cardiovascular:  Negative for chest pain, palpitations and leg swelling.   Objective:    Vitals:   11/15/22 1342  BP: 124/64  Pulse: 64  SpO2: 98%  Weight: 142 lb (64.4 kg)  Height: 5\' 4"  (1.626 m)   SpO2: 98 %  Physical Exam: General: Well-appearing, no acute distress HENT: [place], AT Eyes: EOMI, no scleral icterus Respiratory: Diminished to auscultation bilaterally.  No crackles, wheezing or rales Cardiovascular: RRR, -M/R/G, no JVD Extremities:-Edema,-tenderness Neuro: AAO x4, CNII-XII grossly intact Psych: Normal mood, normal affect  Data Reviewed  Chest imaging: CT Chest 07/24/17 - Severe emphysema. RML groundglass nodule 2mm CT Chest 03/09/18 -  Severe emphysema. Interval resolution of ground glass nodule CT Chest 05/05/20 - Emphysema present. 4.7 mm noncalcified nodule. CTA 04/17/21 - No PE. Emphysema. LUL patchy infiltrate. 5 mm LLL groundglass nodule which is new CT Chest 07/20/22 - Emphysema, left bronchial debris in LLL  PFT:  09/26/16 FVC 3.13 (120%) FEV1 1.84 (99%) Ratio 71  TLC 92% DLCO 42% Interpretation: Normal spirometry  Labs: Absolute Eosinophils  11/06/2017: 300 04/28/18: 200 04/25/21: 300  Assessment & Plan:  77 year old female active smoker with COPD with emphysema, nocturnal hypoxemia, hx right breast cancer, osteoporosis who presents for follow-up. Well controlled COPD on Trelegy, chronic macrolide and PRN rescue/nebulizer. Has not been needing oxygen. Discussed clinical course and management of COPD including bronchodilator regimen, preventive care including vaccinations and action  plan for exacerbation (prefers low-dose prednisone close).  GOLD Class C  Nocturnal hypoxemia --CONTINUE Trelegy 200 ONE puff ONCE a day. REFILLED 1 year --CONTINUE chronic macrolide. Azithromycin M-W-F. REFILLED 1 year --Continue 2L O2 nightly. DME [facility] in [place], [place]   Due 08/2023  Nebulizers - Use twice a day --Duoneb PREFERRED however this can cause palpitations  Tobacco abuse Patient is an active smoker  Health Maintenance Immunization History  Administered Date(s) Administered   Influenza Inj Mdck Quad Pf 01/23/2018   Influenza, High Dose Seasonal PF 01/18/2021   Influenza-Unspecified 01/13/2015, 02/08/2016, 01/16/2017, 01/28/2018   Janssen (J&J) SARS-COV-2 Vaccination 07/01/2019, 02/12/2020   Pneumococcal Conjugate PCV 7 08/02/2014   Pneumococcal Polysaccharide-23 08/10/2016   Pneumococcal-Unspecified 08/02/2014   Td (Adult) 08/02/2014   Tdap 08/02/2014   CT Lung Screen - Enrolled  No orders of the defined types were placed in this encounter.  No orders of the defined types were placed in  this encounter.   Return in about 6 months (around 05/18/2023).  I have spent a total time of 30-minutes on the day of the appointment including chart review, data review, collecting history, coordinating care and discussing medical diagnosis and plan with the patient/family. Past medical history, allergies, medications were reviewed. Pertinent imaging, labs and tests included in this note have been reviewed and interpreted independently by me.  Nichoals Heyde Mechele Collin, MD  Pulmonary Critical Care 11/15/2022 1:59 PM

## 2022-11-24 ENCOUNTER — Other Ambulatory Visit: Payer: Self-pay | Admitting: Pulmonary Disease

## 2022-11-27 ENCOUNTER — Other Ambulatory Visit (HOSPITAL_BASED_OUTPATIENT_CLINIC_OR_DEPARTMENT_OTHER): Payer: Self-pay

## 2022-11-27 ENCOUNTER — Encounter (HOSPITAL_BASED_OUTPATIENT_CLINIC_OR_DEPARTMENT_OTHER): Payer: Self-pay | Admitting: Pulmonary Disease

## 2022-11-29 ENCOUNTER — Encounter (HOSPITAL_BASED_OUTPATIENT_CLINIC_OR_DEPARTMENT_OTHER): Payer: Self-pay | Admitting: Pulmonary Disease

## 2022-12-01 MED ORDER — PREDNISONE 10 MG PO TABS: ORAL_TABLET | ORAL | 0 refills | Status: AC

## 2022-12-06 MED ORDER — PREDNISONE 10 MG PO TABS: ORAL_TABLET | ORAL | 0 refills | Status: AC

## 2022-12-06 NOTE — Addendum Note (Signed)
Addended by: Luciano Cutter on: 12/06/2022 03:20 PM   Modules accepted: Orders

## 2022-12-19 ENCOUNTER — Other Ambulatory Visit: Payer: Self-pay | Admitting: Pulmonary Disease

## 2022-12-20 ENCOUNTER — Other Ambulatory Visit: Payer: Self-pay | Admitting: Pulmonary Disease

## 2022-12-25 ENCOUNTER — Encounter (HOSPITAL_BASED_OUTPATIENT_CLINIC_OR_DEPARTMENT_OTHER): Payer: Self-pay | Admitting: Pulmonary Disease

## 2022-12-25 DIAGNOSIS — J432 Centrilobular emphysema: Secondary | ICD-10-CM

## 2022-12-25 DIAGNOSIS — J42 Unspecified chronic bronchitis: Secondary | ICD-10-CM

## 2022-12-26 ENCOUNTER — Other Ambulatory Visit (HOSPITAL_BASED_OUTPATIENT_CLINIC_OR_DEPARTMENT_OTHER): Payer: Self-pay

## 2022-12-26 MED ORDER — IPRATROPIUM-ALBUTEROL 0.5-2.5 (3) MG/3ML IN SOLN: 3.0000 mL | Freq: Four times a day (QID) | RESPIRATORY_TRACT | 3 refills | Status: DC | PRN

## 2023-01-05 ENCOUNTER — Encounter (HOSPITAL_BASED_OUTPATIENT_CLINIC_OR_DEPARTMENT_OTHER): Payer: Self-pay | Admitting: Pulmonary Disease

## 2023-01-06 MED ORDER — PREDNISONE 10 MG PO TABS: ORAL_TABLET | ORAL | 0 refills | Status: AC

## 2023-01-16 ENCOUNTER — Encounter (HOSPITAL_BASED_OUTPATIENT_CLINIC_OR_DEPARTMENT_OTHER): Payer: Self-pay | Admitting: Pulmonary Disease

## 2023-01-17 IMAGING — CT CT ANGIO CHEST
2 of 6 series · 18 of 36 positions shown · IV contrast (Omnipaque or Isovue)
Comparison: 05/05/2020, 04/17/2021

CLINICAL DATA: Hemoptysis since yesterday, history of breast cancer

EXAM:
CT ANGIOGRAPHY CHEST WITH CONTRAST
TECHNIQUE: Multidetector CT imaging of the chest was performed using the
standard protocol during bolus administration of intravenous
contrast. Multiplanar CT image reconstructions and MIPs were
obtained to evaluate the vascular anatomy.

[Series 7: pe axial thins · axial · 0.65mm/px · z∈[+1274,+1570]mm · 17 of 410 slices shown]
[im 20/410  lung]
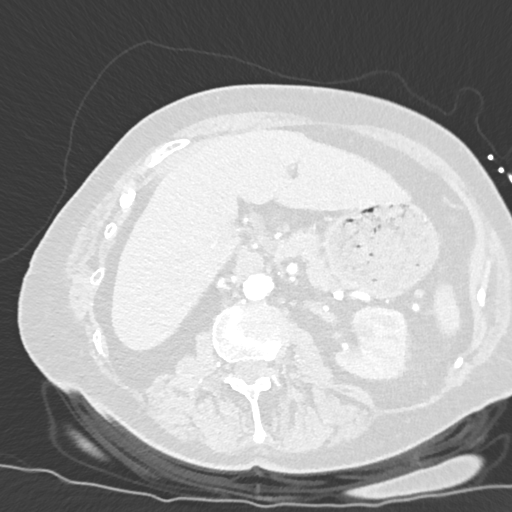
[im 39/410  mediastinal]
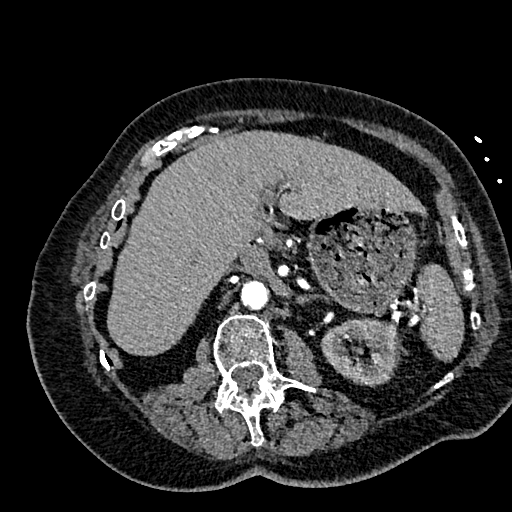
[im 59/410  lung]
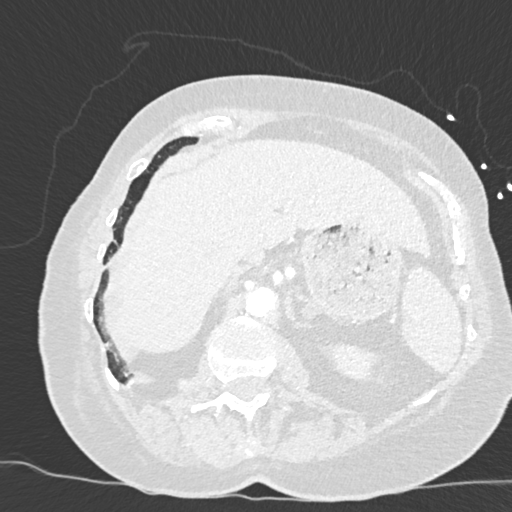
[im 98/410  mediastinal]
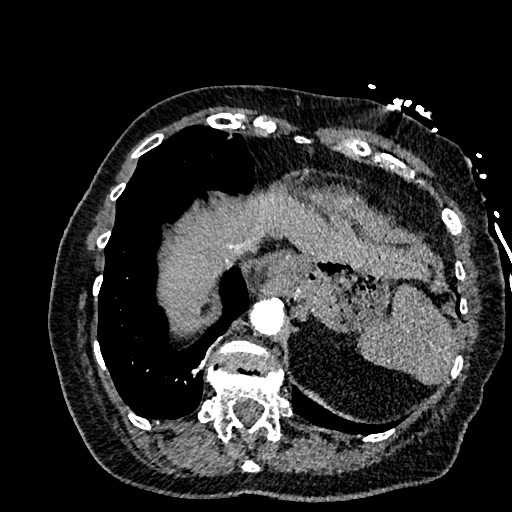
[im 117/410  lung]
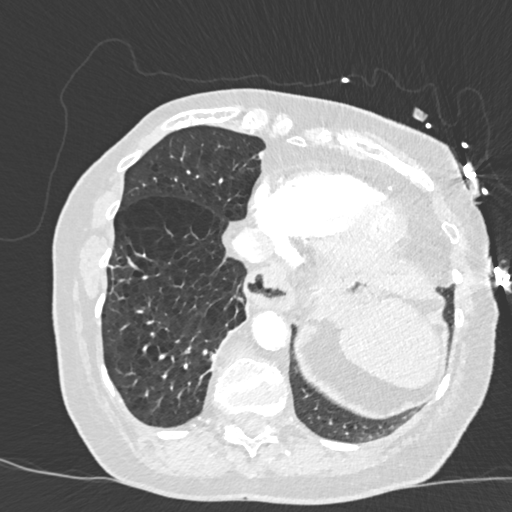
[im 137/410  mediastinal]
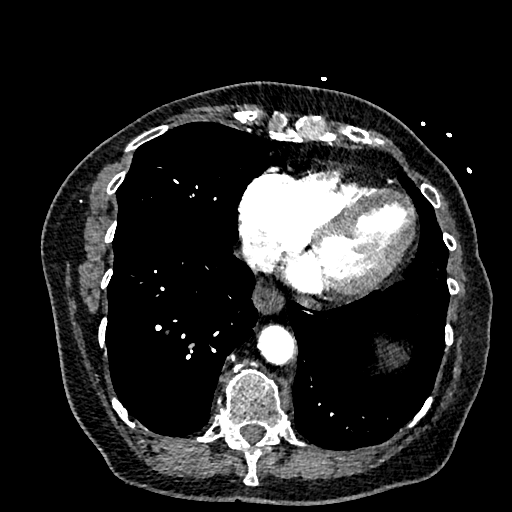
[im 156/410  lung]
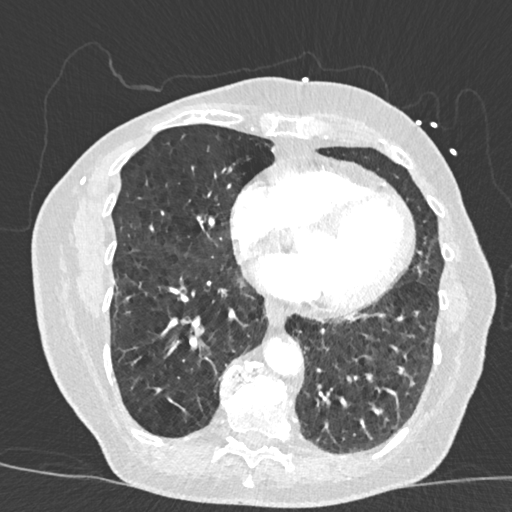
[im 176/410  mediastinal]
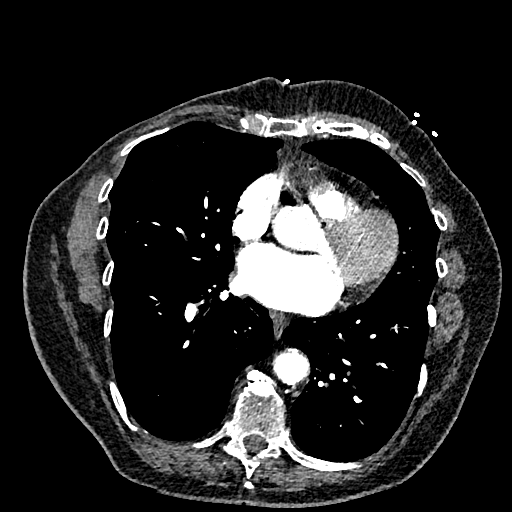
[im 215/410  lung]
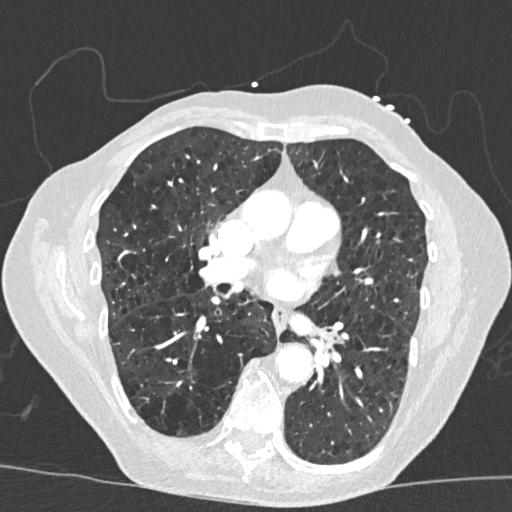
[im 234/410  mediastinal]
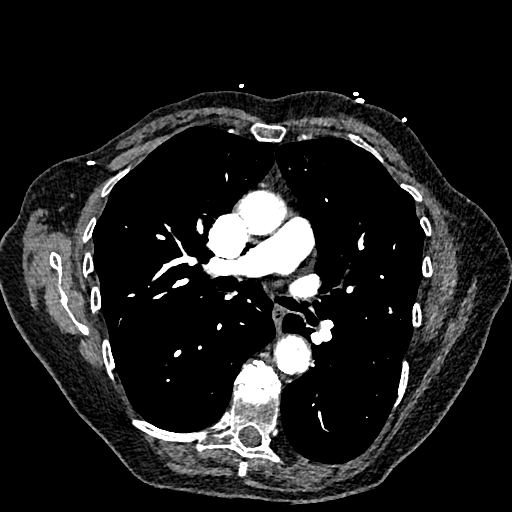
[im 254/410  lung]
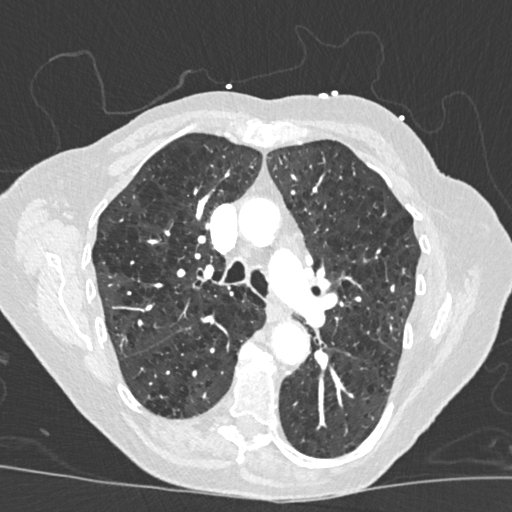
[im 273/410  mediastinal]
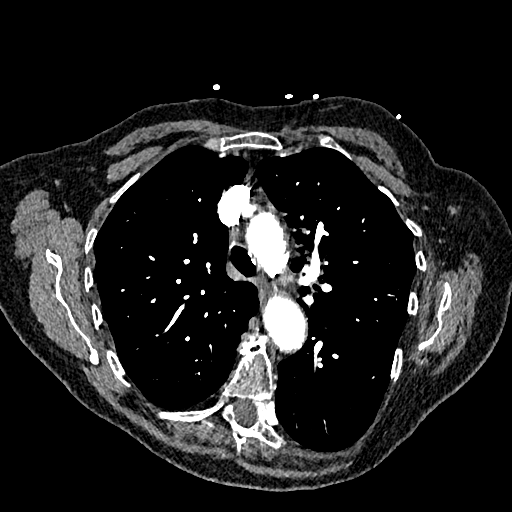
[im 293/410  lung]
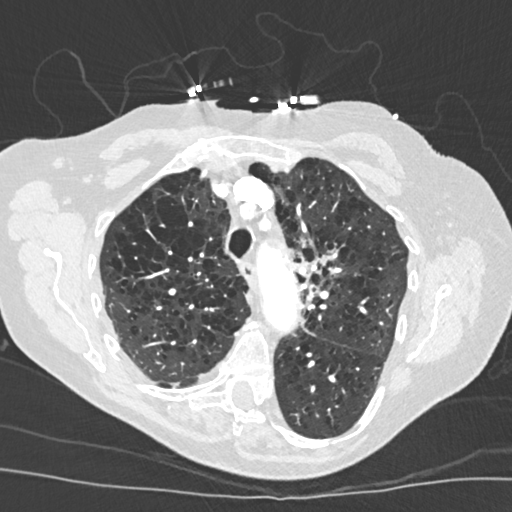
[im 312/410  mediastinal]
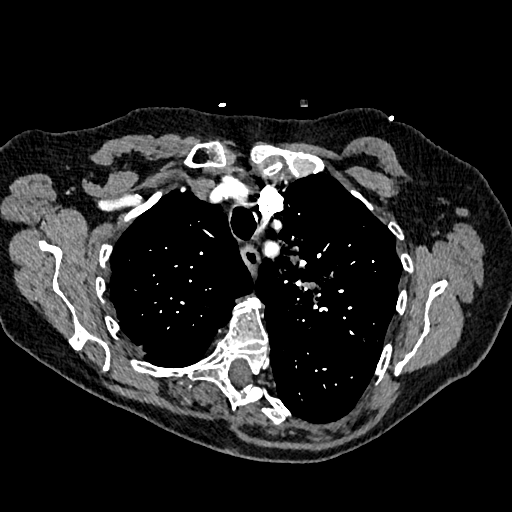
[im 351/410  lung]
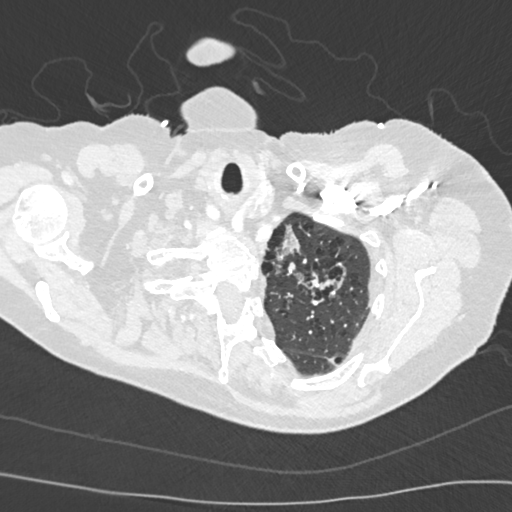
[im 371/410  mediastinal]
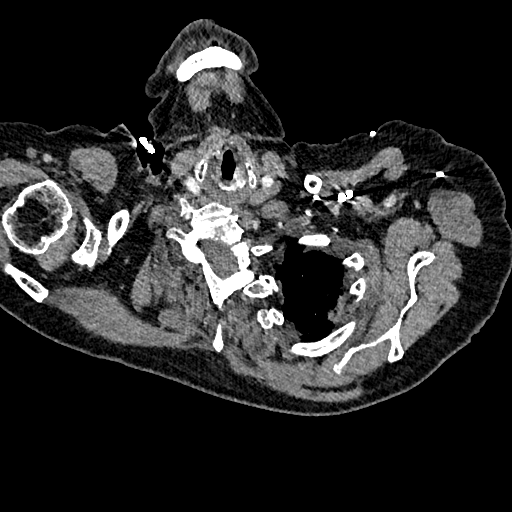
[im 390/410  lung]
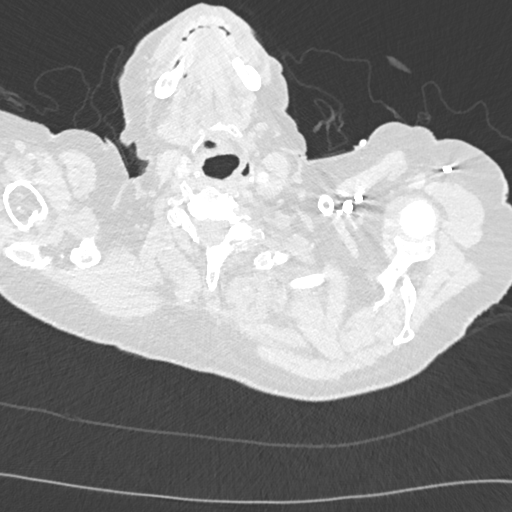

[Series 9: cor soft · coronal · 0.63mm/px · 1 of 151 slices shown]
[im 76/151  mediastinal]
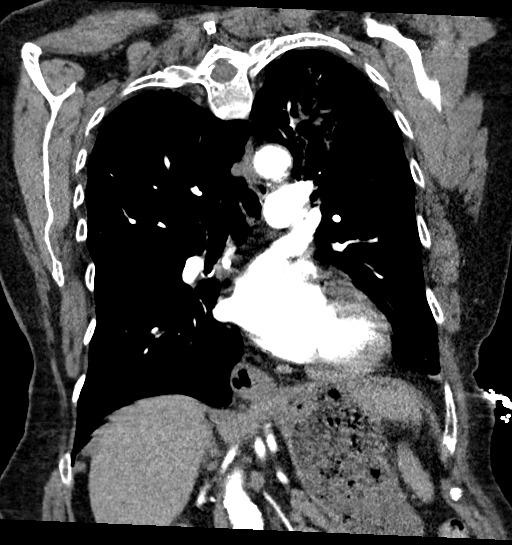

[18 of 36 positions shown; findings below may reference images not displayed]

RADIATION DOSE REDUCTION: This exam was performed according to the
departmental dose-optimization program which includes automated
exposure control, adjustment of the mA and/or kV according to
patient size and/or use of iterative reconstruction technique.

CONTRAST:  75mL OMNIPAQUE IOHEXOL 350 MG/ML SOLN
FINDINGS: Cardiovascular: This is a technically adequate evaluation of the
pulmonary vasculature. No filling defects or pulmonary emboli.

The heart is unremarkable without pericardial effusion. No evidence
of thoracic aortic aneurysm or dissection. Moderate atherosclerosis
of the aortic arch.

Mediastinum/Nodes: No enlarged mediastinal, hilar, or axillary lymph
nodes. Thyroid gland, trachea, and esophagus demonstrate no
significant findings. Small hiatal hernia.

Lungs/Pleura: Diffuse upper lobe predominant emphysema. Patchy left
upper lobe airspace disease consistent with bronchopneumonia.

5 mm ground-glass nodule within the left lower lobe reference image
105/8 is new since prior exam. This could be infectious given the
left upper lobe findings. Chronic scarring within the lingula, right
middle lobe, and bilateral lower lobes.

There is mild bilateral bronchial wall thickening, most pronounced
in the left upper and bilateral lower lobes.

Upper Abdomen: No acute abnormality.

Musculoskeletal: Chronic appearing compression deformity inferior
endplate T12 vertebral body and healed right twelfth rib fracture,
new since 05/05/2020. No other acute displaced fractures. Stable
multilevel thoracic spondylosis.

Review of the MIP images confirms the above findings.
IMPRESSION: 1. Left upper lobe airspace disease consistent with
bronchopneumonia.
2. No evidence of pulmonary embolus.
3. 5 mm ground-glass nodule left lower lobe, new since prior exam,
likely related to infection given the left upper lobe findings.
Follow-up CT in 6 months may be useful to assess interval
resolution.
4. Bilateral bronchial wall thickening consistent with reactive
airway disease or bronchitis.
5. Aortic Atherosclerosis (VZJQE-475.5) and Emphysema (VZJQE-9SJ.B).
6. Small hiatal hernia.

## 2023-01-17 IMAGING — DX DG CHEST 2V
2 series · 2 of 2 positions shown · non-contrast
Comparison: CT chest 05/05/2020

CLINICAL DATA: Coughing up blood.  Shortness of breath.

EXAM:
CHEST - 2 VIEW

[chest pa]
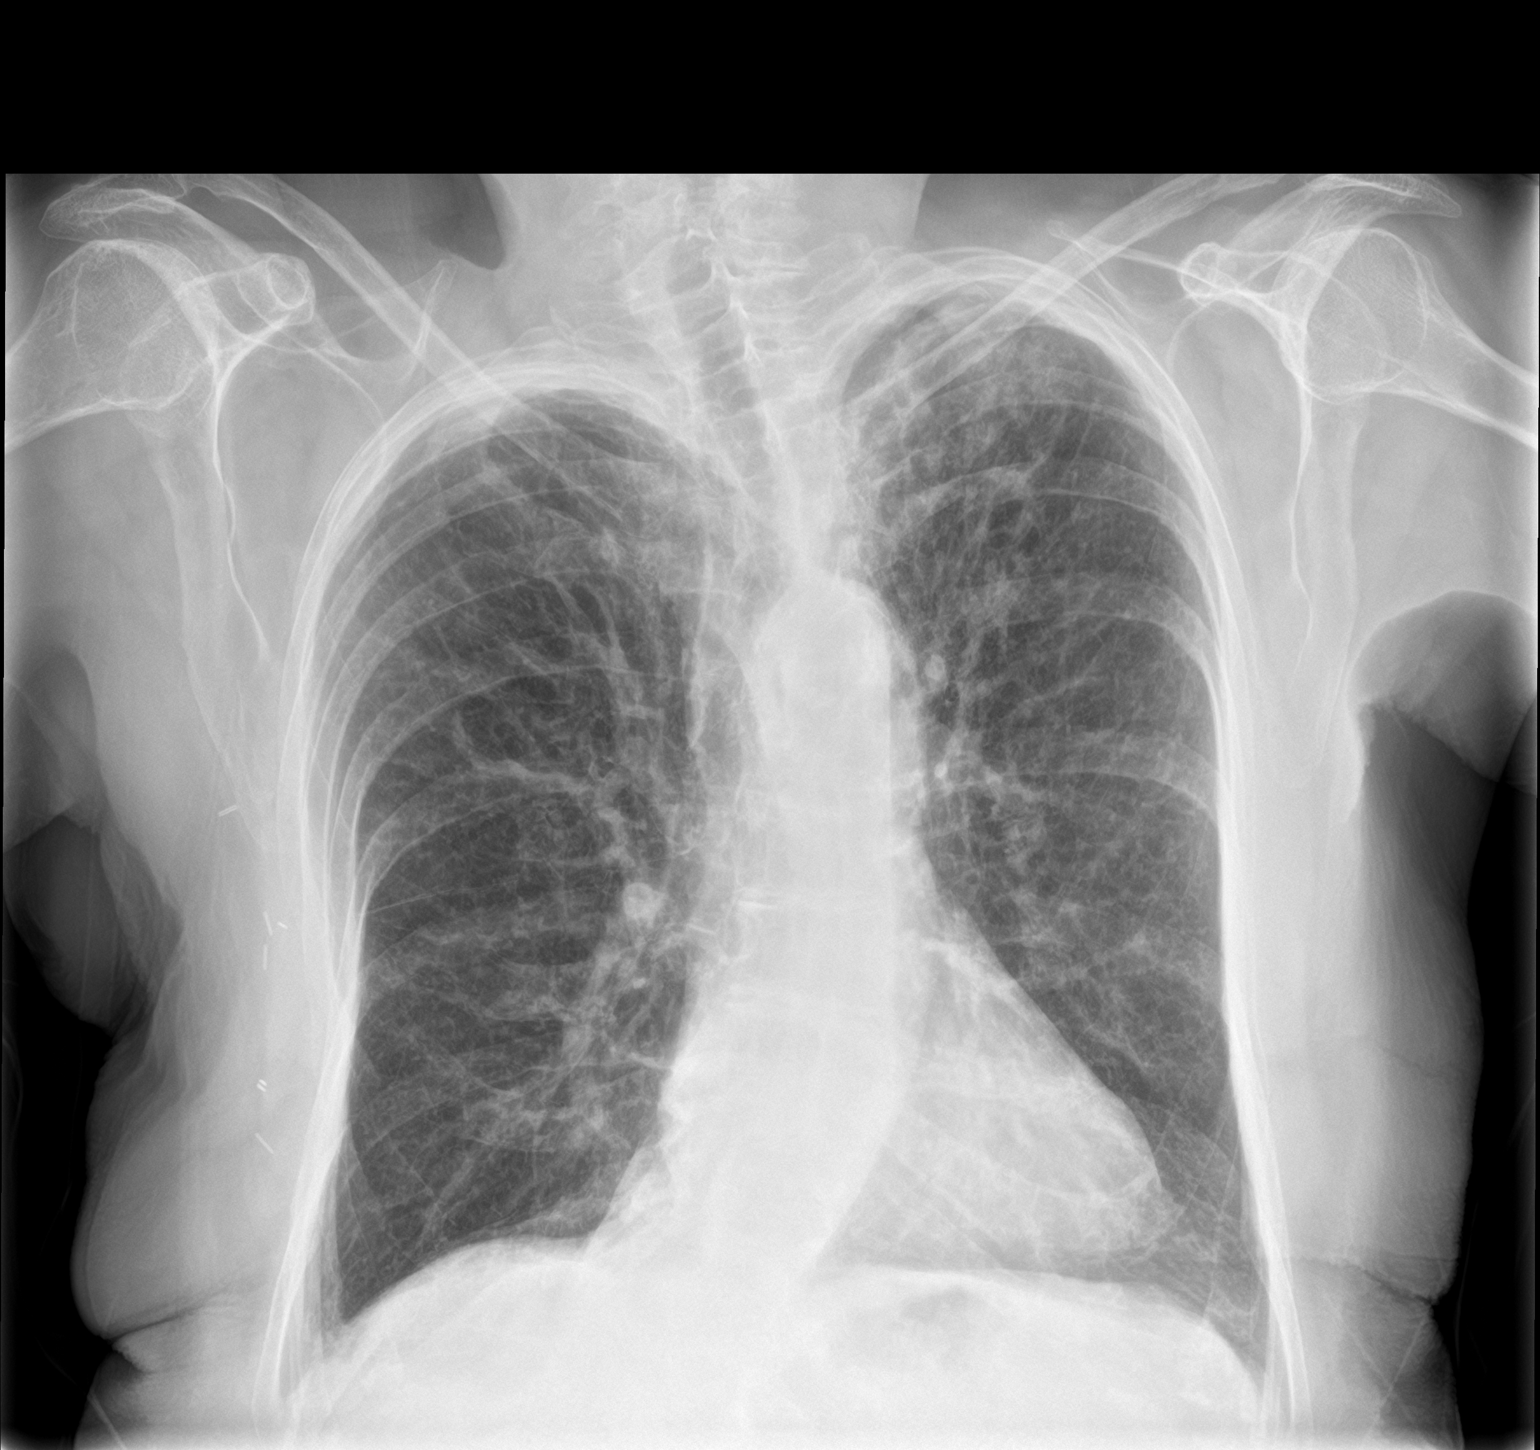

[chest lat]
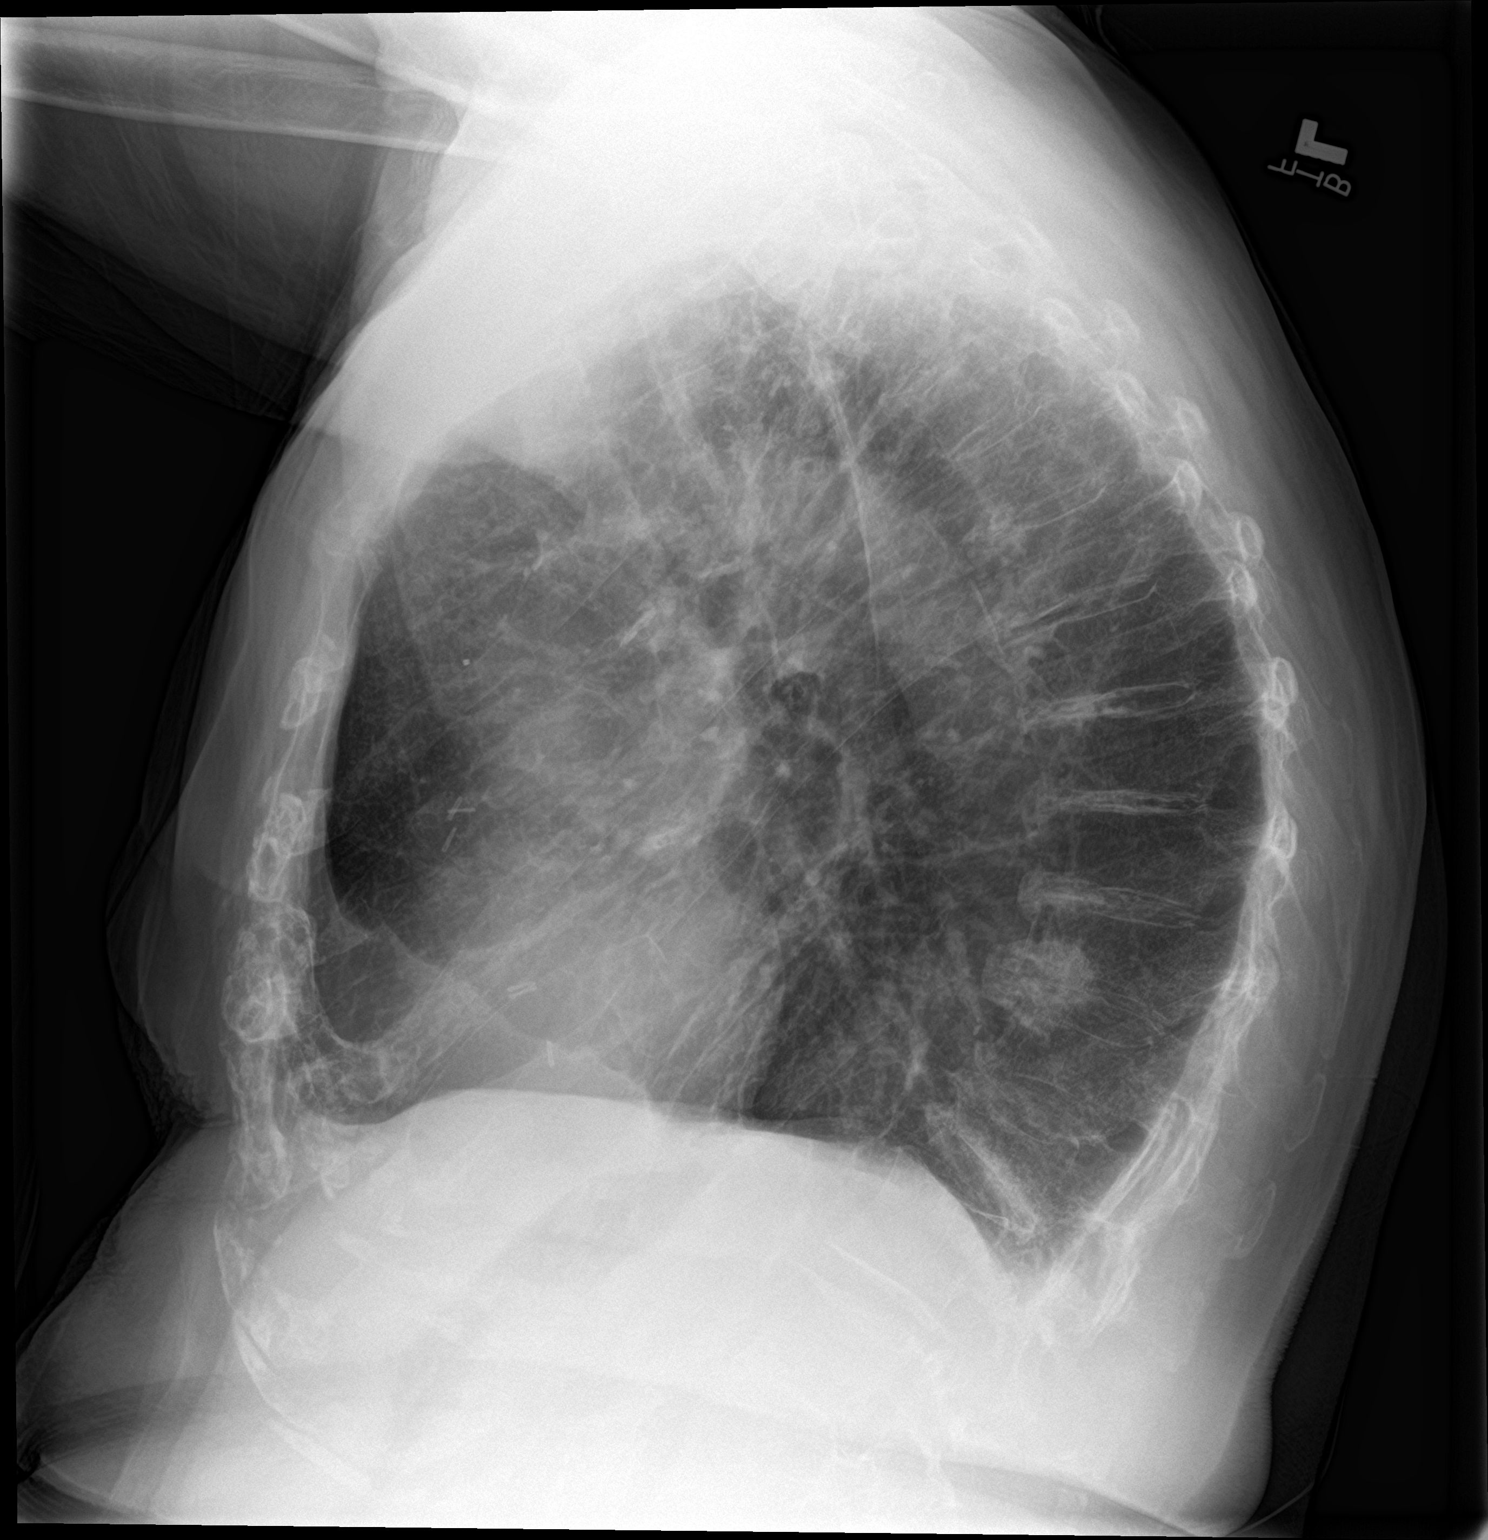

[2 of 2 positions shown; findings below may reference images not displayed]

FINDINGS: Cardiac silhouette and mediastinal contours are unchanged and within
normal limits. Mild calcification within aortic arch. There is again
flattening of the diaphragms. There is centrilobular emphysematous
change with cystic changes again seen within the upper lungs.
Moderate biapical pleural scarring is similar to prior. Mild density
overlying the right costophrenic angle may represent scarring, as no
definite pleural effusion is seen on lateral view. No pneumothorax.
Mild levocurvature of the thoracic spine. Moderate multilevel
degenerative disc changes. Right axillary surgical clips are again
noted.
IMPRESSION: No active cardiopulmonary disease.  COPD.

## 2023-01-17 MED ORDER — PREDNISONE 10 MG PO TABS: ORAL_TABLET | ORAL | 0 refills | Status: AC

## 2023-01-17 MED ORDER — DOXYCYCLINE HYCLATE 100 MG PO TABS: 100.0000 mg | ORAL_TABLET | Freq: Two times a day (BID) | ORAL | 0 refills | Status: DC

## 2023-01-23 ENCOUNTER — Encounter (HOSPITAL_BASED_OUTPATIENT_CLINIC_OR_DEPARTMENT_OTHER): Payer: Self-pay | Admitting: Pulmonary Disease

## 2023-04-02 ENCOUNTER — Encounter (HOSPITAL_BASED_OUTPATIENT_CLINIC_OR_DEPARTMENT_OTHER): Payer: Self-pay | Admitting: Pulmonary Disease

## 2023-04-02 MED ORDER — DOXYCYCLINE HYCLATE 100 MG PO TABS: 100.0000 mg | ORAL_TABLET | Freq: Two times a day (BID) | ORAL | 0 refills | Status: DC

## 2023-04-08 ENCOUNTER — Other Ambulatory Visit (HOSPITAL_BASED_OUTPATIENT_CLINIC_OR_DEPARTMENT_OTHER): Payer: Self-pay

## 2023-04-08 MED ORDER — DOXYCYCLINE HYCLATE 100 MG PO TABS: 100.0000 mg | ORAL_TABLET | Freq: Two times a day (BID) | ORAL | 0 refills | Status: DC

## 2023-04-08 MED ORDER — PREDNISONE 10 MG PO TABS: ORAL_TABLET | ORAL | 0 refills | Status: DC

## 2023-04-11 ENCOUNTER — Telehealth (HOSPITAL_BASED_OUTPATIENT_CLINIC_OR_DEPARTMENT_OTHER): Payer: Medicare Other | Admitting: Pulmonary Disease

## 2023-04-11 ENCOUNTER — Encounter (HOSPITAL_BASED_OUTPATIENT_CLINIC_OR_DEPARTMENT_OTHER): Payer: Self-pay | Admitting: Pulmonary Disease

## 2023-04-11 DIAGNOSIS — J441 Chronic obstructive pulmonary disease with (acute) exacerbation: Secondary | ICD-10-CM | POA: Diagnosis not present

## 2023-04-11 DIAGNOSIS — J432 Centrilobular emphysema: Secondary | ICD-10-CM

## 2023-04-11 DIAGNOSIS — G4734 Idiopathic sleep related nonobstructive alveolar hypoventilation: Secondary | ICD-10-CM

## 2023-04-11 NOTE — Progress Notes (Signed)
 Virtual Visit via Video Note  I connected with Elaine [name] on 04/11/23 at  2:30 PM EST by a video enabled telemedicine application and verified that I am speaking with the correct person using two identifiers.  Location: Patient: Home Provider: Home   I discussed the limitations of evaluation and management by telemedicine and the availability of in person appointments. The patient expressed understanding and agreed to proceed.   I discussed the assessment and treatment plan with the patient. The patient was provided an opportunity to ask questions and all were answered. The patient agreed with the plan and demonstrated an understanding of the instructions.   The patient was advised to call back or seek an in-person evaluation if the symptoms worsen or if the condition fails to improve as anticipated.  I provided 30 minutes of non-face-to-face time during this encounter.   Latima Hamza Slater Staff, MD   Subjective:   PATIENT ID: Elaine [name] GENDER: female DOB: [birth year], MRN: [id number]   HPI  Chief Complaint  Patient presents with   Follow-up    COPD exacerbation follow-up   [name] is a 78 year-old female with hx right breast cancer s/p mastectomy who presents for COPD follow-up  2019 - Established care with [facility] in November. On Anoro and chronic macrolide 2020 - Outpatient exacerbation Dec 2021 - Urgent care for COPD exacerbation in July 2022 - Hospitalized for COPD exacerbation in June 2023 - PNA followed by recurrent hemoptysis > bronchoscopy 05/01/21 demonstrated old blood clot occluding subsegment of left upper lobe which was aspirated. Outpatient COPD exacerbation in Feb and April and May. Hospitalized in July. 2024 - Well controlled COPD on Trelegy and chronic macrolide. Nocturnal oxygen  recertified in June. Exacerbation in August and October 2025 - Exacerbation in Jan.  04/11/23 Since our last visit she had COPD exacerbation on New Years and has  completed doxycycline  and prednisone  but doing better. Remains on Trelegy. She is on Duonebs. Restarted scheduled azithromycin  today. Active smoker. Compliant with oxygen .  Social: 50 pack year history. Quit smoking 01/2018. Currently smoking  Past Medical History:  Diagnosis Date   Arrhythmia    Breast cancer (HCC) 1995   left    Cataract    Complication of anesthesia    Emphysema of lung (HCC)    Family history of breast cancer    Hiatal hernia 1992   Hypertension    Hyperthyroidism    Osteoporosis    PONV (postoperative nausea and vomiting)       Social History   Occupational History   Occupation: Airline Pilot  Tobacco Use   Smoking status: Every Day    Current packs/day: 0.25    Average packs/day: 0.3 packs/day for 50.0 years (12.5 ttl pk-yrs)    Types: Cigarettes   Smokeless tobacco: Never   Tobacco comments:    2 cigarettes per day-- ARJ 10/15/21  Vaping Use   Vaping status: Never Used  Substance and Sexual Activity   Alcohol use: Not Currently    Comment: Martini daily   Drug use: Never   Sexual activity: Not on file    Allergies  Allergen Reactions   Bee Venom Other (See Comments)    Unknown   Codeine Nausea Only   Macrobid [Nitrofurantoin Macrocrystal] Other (See Comments)    Unknown   Morphine  And Codeine Other (See Comments)    Unknown   Oxycodone  Nausea Only   Penicillins Other (See Comments)    Unknown Has patient had a PCN reaction  causing immediate rash, facial/tongue/throat swelling, SOB or lightheadedness with hypotension: Unknown Has patient had a PCN reaction causing severe rash involving mucus membranes or skin necrosis: Unknown Has patient had a PCN reaction that required hospitalization: Unknown Has patient had a PCN reaction occurring within the last 10 years: No If all of the above answers are NO, then may proceed with Cephalosporin use.    Tyloxapol Nausea Only and Other (See Comments)    Extreme Drugged feeling, Doesn't affect pain      Outpatient Medications Prior to Visit  Medication Sig Dispense Refill   aspirin 81 MG EC tablet Take 81 mg by mouth daily. Swallow whole.     azithromycin  (ZITHROMAX ) 250 MG tablet Take 1 tablet (250 mg total) by mouth every Monday, Wednesday, and Friday. 39 tablet 3   ciprofloxacin  (CIPRO ) 500 MG tablet Take 500 mg by mouth 2 (two) times daily.     denosumab (PROLIA) 60 MG/ML SOSY injection Inject 60 mg into the skin every 6 (six) months.     doxycycline  (VIBRA -TABS) 100 MG tablet Take 1 tablet (100 mg total) by mouth 2 (two) times daily. 14 tablet 0   Fluticasone -Umeclidin-Vilant (TRELEGY ELLIPTA ) 200-62.5-25 MCG/ACT AEPB Inhale 1 puff into the lungs daily. 180 each 3   guaiFENesin  (MUCINEX ) 600 MG 12 hr tablet Take 1 tablet (600 mg total) by mouth 2 (two) times daily.     ipratropium (ATROVENT ) 0.06 % nasal spray Place 2 sprays into both nostrils 3 (three) times daily. 15 mL 1   ipratropium-albuterol  (DUONEB) 0.5-2.5 (3) MG/3ML SOLN Take 3 mLs by nebulization every 6 (six) hours as needed. J43.2, J42 360 mL 3   levothyroxine  (SYNTHROID ) 50 MCG tablet Take 50 mcg by mouth every other day. BRAND ONLY     losartan  (COZAAR ) 50 MG tablet Take 50 mg by mouth daily.     OXYGEN  Inhale 2 L into the lungs continuous.     predniSONE  (DELTASONE ) 10 MG tablet 10 mg tablets (4 pills  x 2 day, 3 pills x 2 day, 2 pills x 2 day, 1 pill x 2 day, STOP) 20 tablet 0   rosuvastatin  (CRESTOR ) 5 MG tablet Take 5 mg by mouth daily.     SYNTHROID  75 MCG tablet Take 75 mcg by mouth every other day. BRAND ONLY     VENTOLIN  HFA 108 (90 Base) MCG/ACT inhaler INHALE 2 PUFFS BY MOUTH EVERY 4 HOURS AS NEEDED FOR WHEEZE OR FOR SHORTNESS OF BREATH 54 each 1   No facility-administered medications prior to visit.    Review of Systems  Constitutional:  Negative for chills, diaphoresis, fever, malaise/fatigue and weight loss.  HENT:  Negative for congestion.   Respiratory:  Positive for cough, sputum production and  shortness of breath. Negative for hemoptysis and wheezing.   Cardiovascular:  Negative for chest pain, palpitations and leg swelling.   Objective:    There were no vitals filed for this visit.   Physical Exam: General: Well-appearing, no acute distress HENT: [place], AT Eyes: EOMI, no scleral icterus Respiratory: No respiratory distress Neuro: AAO x4, CNII-XII grossly intact Psych: Normal mood, normal affect   Data Reviewed  Chest imaging: CT Chest 07/24/17 - Severe emphysema. RML groundglass nodule 2mm CT Chest 03/09/18 - Severe emphysema. Interval resolution of ground glass nodule CT Chest 05/05/20 - Emphysema present. 4.7 mm noncalcified nodule. CTA 04/17/21 - No PE. Emphysema. LUL patchy infiltrate. 5 mm LLL groundglass nodule which is new CT Chest 07/20/22 - Emphysema, left bronchial debris  in LLL  PFT:  09/26/16 FVC 3.13 (120%) FEV1 1.84 (99%) Ratio 71  TLC 92% DLCO 42% Interpretation: Normal spirometry  Labs: Absolute Eosinophils  11/06/2017: 300 04/28/18: 200 04/25/21: 300  Assessment & Plan:  78 year old female active smoker with COPD with emphysema, nocturnal hypoxemia, hx right breast cancer, osteoporosis who presents for follow-up. Recent exacerbation this month with improvement. Has resumed chronic macrolide. Discussed clinical course and management of COPD including bronchodilator regimen, preventive care and action plan for exacerbation. Consider additional of Ohtuvayre  at next visit.  Prefers low-dose prednisone  close for exacerbations if possible  COPD GOLD Class C - improved after exacerbation but persistent symptoms Nocturnal hypoxemia --CONTINUE Trelegy 200 ONE puff ONCE a day. --CONTINUE Duonebs 1-2 times a day --CONTINUE chronic macrolide. Azithromycin  M-W-F.  --Continue 2L O2 nightly. DME [facility] in [place], [place]   Due 08/2023 --Consider Ohtuvayre  at the next visit  Nebulizers - Use twice a day --Duoneb PREFERRED however this can cause  palpitations  Tobacco abuse Patient is an active smoker  Health Maintenance Immunization History  Administered Date(s) Administered   Influenza Inj Mdck Quad Pf 01/23/2018   Influenza, High Dose Seasonal PF 01/18/2021   Influenza-Unspecified 01/13/2015, 02/08/2016, 01/16/2017, 01/28/2018   Janssen (J&J) SARS-COV-2 Vaccination 07/01/2019, 02/12/2020   Pneumococcal Conjugate PCV 7 08/02/2014   Pneumococcal Polysaccharide-23 08/10/2016   Pneumococcal-Unspecified 08/02/2014   Td (Adult) 08/02/2014   Tdap 08/02/2014   CT Lung Screen - Enrolled  No orders of the defined types were placed in this encounter.  No orders of the defined types were placed in this encounter.   Return for follow-up already scheduled.  I have spent a total time of 32-minutes on the day of the appointment including chart review, data review, collecting history, coordinating care and discussing medical diagnosis and plan with the patient/family. Past medical history, allergies, medications were reviewed. Pertinent imaging, labs and tests included in this note have been reviewed and interpreted independently by me.  Damarie Schoolfield Slater Staff, MD [facility] 04/11/2023 4:27 PM

## 2023-04-11 NOTE — Patient Instructions (Signed)
 GOLD Class C  Nocturnal hypoxemia --CONTINUE Trelegy 200 ONE puff ONCE a day. --CONTINUE Duonebs 1-2 times a day --CONTINUE chronic macrolide. Azithromycin  M-W-F.  --Continue 2L O2 nightly. DME [facility] in [place], [place]   Due 08/2023 --Consider Ohtuvayre  at the next visit

## 2023-04-22 ENCOUNTER — Encounter (HOSPITAL_BASED_OUTPATIENT_CLINIC_OR_DEPARTMENT_OTHER): Payer: Self-pay | Admitting: Pulmonary Disease

## 2023-04-22 MED ORDER — PREDNISONE 10 MG PO TABS: ORAL_TABLET | ORAL | 0 refills | Status: DC

## 2023-04-22 MED ORDER — DOXYCYCLINE HYCLATE 100 MG PO TABS: 100.0000 mg | ORAL_TABLET | Freq: Two times a day (BID) | ORAL | 0 refills | Status: DC

## 2023-04-24 ENCOUNTER — Encounter (HOSPITAL_BASED_OUTPATIENT_CLINIC_OR_DEPARTMENT_OTHER): Payer: Self-pay | Admitting: Pulmonary Disease

## 2023-04-24 NOTE — Telephone Encounter (Signed)
Spoke with patient. She states that she is still struggling having SOB but the chest isnt as tight since doing the breathing treatment this am. She is able to cough a lot of gunk up and is using nasal spray for the nasal congestion. Her O2 is 97% and her pulse is 80. BP 97/72.She is currently on the prednisone and the antibiotic since Tuesday. She is trying to stay out of ED. Any advice or suggestions? Only using oxygen at night. Her SOB does get worse when she Is up and about.

## 2023-05-15 ENCOUNTER — Encounter (HOSPITAL_BASED_OUTPATIENT_CLINIC_OR_DEPARTMENT_OTHER): Payer: Self-pay | Admitting: Pulmonary Disease

## 2023-05-19 ENCOUNTER — Encounter (HOSPITAL_BASED_OUTPATIENT_CLINIC_OR_DEPARTMENT_OTHER): Payer: Self-pay | Admitting: Pulmonary Disease

## 2023-05-19 ENCOUNTER — Ambulatory Visit (HOSPITAL_BASED_OUTPATIENT_CLINIC_OR_DEPARTMENT_OTHER): Payer: Medicare Other | Admitting: Pulmonary Disease

## 2023-05-19 VITALS — BP 122/74 | HR 76 | Ht 64.0 in | Wt 143.4 lb

## 2023-05-19 DIAGNOSIS — J449 Chronic obstructive pulmonary disease, unspecified: Secondary | ICD-10-CM | POA: Diagnosis not present

## 2023-05-19 DIAGNOSIS — G4734 Idiopathic sleep related nonobstructive alveolar hypoventilation: Secondary | ICD-10-CM | POA: Diagnosis not present

## 2023-05-19 DIAGNOSIS — J432 Centrilobular emphysema: Secondary | ICD-10-CM

## 2023-05-19 MED ORDER — IPRATROPIUM-ALBUTEROL 0.5-2.5 (3) MG/3ML IN SOLN: 3.0000 mL | Freq: Four times a day (QID) | RESPIRATORY_TRACT | 3 refills | Status: DC | PRN

## 2023-05-19 MED ORDER — DOXYCYCLINE HYCLATE 100 MG PO TABS: 100.0000 mg | ORAL_TABLET | Freq: Two times a day (BID) | ORAL | 0 refills | Status: DC

## 2023-05-19 MED ORDER — TRELEGY ELLIPTA 200-62.5-25 MCG/ACT IN AEPB: 1.0000 | INHALATION_SPRAY | Freq: Every day | RESPIRATORY_TRACT | 3 refills | Status: DC

## 2023-05-19 MED ORDER — PREDNISONE 10 MG PO TABS: ORAL_TABLET | ORAL | 0 refills | Status: DC

## 2023-05-19 NOTE — Patient Instructions (Signed)
COPD GOLD Class C - improved after exacerbation but persistent symptoms Nocturnal hypoxemia --CONTINUE Trelegy 200 ONE puff ONCE a day. --CONTINUE Duonebs 1-2 times a day --CONTINUE chronic macrolide. Azithromycin M-W-F.  --Continue 2L O2 nightly. DME [facility] in [place], [place]   >ORDER overnight oximetry --Discussed and provided handout [facility]

## 2023-05-19 NOTE — Progress Notes (Signed)
 Subjective:   PATIENT ID: Elaine [name] GENDER: female DOB: 1945-11-04, MRN: [id number]   HPI  Chief Complaint  Patient presents with   Follow-up    COPD   [name] is a 78 year-old female with hx right breast cancer s/p mastectomy who presents for COPD follow-up  2019 - Established care with [facility] in November. On Anoro and chronic macrolide 2020 - Outpatient exacerbation Dec 2021 - Urgent care for COPD exacerbation in July 2022 - Hospitalized for COPD exacerbation in June 2023 - PNA followed by recurrent hemoptysis > bronchoscopy 05/01/21 demonstrated old blood clot occluding subsegment of left upper lobe which was aspirated. Outpatient COPD exacerbation in Feb and April and May. Hospitalized in July. 2024 - Well controlled COPD on Trelegy and chronic macrolide. Nocturnal oxygen recertified in June. Exacerbation in August and October 2025 - Exacerbation in Jan requiring prolonged abx and steroids. Had RSV at end of January.  04/11/23 Since our last visit she had COPD exacerbation on New Years and has completed doxycycline and prednisone but doing better. Remains on Trelegy. She is on Duonebs. Restarted scheduled azithromycin today. Active smoker. Compliant with oxygen.  05/19/23 Since our last visit she contacted office regarding RSV for symptoms. She has had heart racing 120s and shortness of breath. All this has resolved. Compliant with Trelegy  Social: 50 pack year history. Quit smoking 01/2018. Currently smoking  Past Medical History:  Diagnosis Date   Arrhythmia    Breast cancer (HCC) 1995   left    Cataract    Complication of anesthesia    Emphysema of lung (HCC)    Family history of breast cancer    Hiatal hernia 1992   Hypertension    Hyperthyroidism    Osteoporosis    PONV (postoperative nausea and vomiting)       Social History   Occupational History   Occupation: Airline pilot  Tobacco Use   Smoking status: Every Day    Current packs/day: 0.25     Average packs/day: 0.3 packs/day for 50.0 years (12.5 ttl pk-yrs)    Types: Cigarettes   Smokeless tobacco: Never   Tobacco comments:    2 cigarettes per day-- ARJ 10/15/21  Vaping Use   Vaping status: Never Used  Substance and Sexual Activity   Alcohol use: Not Currently    Comment: Martini daily   Drug use: Never   Sexual activity: Not on file    Allergies  Allergen Reactions   Bee Venom Other (See Comments)    Unknown   Codeine Nausea Only   Macrobid [Nitrofurantoin Macrocrystal] Other (See Comments)    Unknown   Morphine And Codeine Other (See Comments)    Unknown   Oxycodone Nausea Only   Penicillins Other (See Comments)    Unknown Has patient had a PCN reaction causing immediate rash, facial/tongue/throat swelling, SOB or lightheadedness with hypotension: Unknown Has patient had a PCN reaction causing severe rash involving mucus membranes or skin necrosis: Unknown Has patient had a PCN reaction that required hospitalization: Unknown Has patient had a PCN reaction occurring within the last 10 years: No If all of the above answers are "NO", then may proceed with Cephalosporin use.    Tyloxapol Nausea Only and Other (See Comments)    Extreme Drugged feeling, Doesn't affect pain     Outpatient Medications Prior to Visit  Medication Sig Dispense Refill   aspirin 81 MG EC tablet Take 81 mg by mouth daily. Swallow whole.  azithromycin (ZITHROMAX) 250 MG tablet Take 1 tablet (250 mg total) by mouth every Monday, Wednesday, and Friday. 39 tablet 3   ciprofloxacin (CIPRO) 500 MG tablet Take 500 mg by mouth 2 (two) times daily.     denosumab (PROLIA) 60 MG/ML SOSY injection Inject 60 mg into the skin every 6 (six) months.     doxycycline (VIBRA-TABS) 100 MG tablet Take 1 tablet (100 mg total) by mouth 2 (two) times daily. 14 tablet 0   Fluticasone-Umeclidin-Vilant (TRELEGY ELLIPTA) 200-62.5-25 MCG/ACT AEPB Inhale 1 puff into the lungs daily. 180 each 3   guaiFENesin  (MUCINEX) 600 MG 12 hr tablet Take 1 tablet (600 mg total) by mouth 2 (two) times daily.     ipratropium (ATROVENT) 0.06 % nasal spray Place 2 sprays into both nostrils 3 (three) times daily. 15 mL 1   ipratropium-albuterol (DUONEB) 0.5-2.5 (3) MG/3ML SOLN Take 3 mLs by nebulization every 6 (six) hours as needed. J43.2, J42 360 mL 3   levothyroxine (SYNTHROID) 50 MCG tablet Take 50 mcg by mouth every other day. BRAND ONLY     losartan (COZAAR) 50 MG tablet Take 50 mg by mouth daily.     OXYGEN Inhale 2 L into the lungs continuous.     predniSONE (DELTASONE) 10 MG tablet 10 mg tablets (4 pills  x 2 day, 3 pills x 2 day, 2 pills x 2 day, 1 pill x 2 day, STOP) 20 tablet 0   rosuvastatin (CRESTOR) 5 MG tablet Take 5 mg by mouth daily.     SYNTHROID 75 MCG tablet Take 75 mcg by mouth every other day. BRAND ONLY     VENTOLIN HFA 108 (90 Base) MCG/ACT inhaler INHALE 2 PUFFS BY MOUTH EVERY 4 HOURS AS NEEDED FOR WHEEZE OR FOR SHORTNESS OF BREATH 54 each 1   No facility-administered medications prior to visit.    Review of Systems  Constitutional:  Negative for chills, diaphoresis, fever, malaise/fatigue and weight loss.  HENT:  Negative for congestion.   Respiratory:  Positive for shortness of breath. Negative for cough, hemoptysis, sputum production and wheezing.   Cardiovascular:  Positive for palpitations. Negative for chest pain and leg swelling.   Objective:    Vitals:   05/19/23 1424  BP: 122/74  Pulse: 76  SpO2: 97%  Weight: 143 lb 6.4 oz (65 kg)  Height: 5\' 4"  (1.626 m)   SpO2: 97 %  Physical Exam: General: Well-appearing, no acute distress HENT: [place], AT Eyes: EOMI, no scleral icterus Respiratory: Clear to auscultation bilaterally.  No crackles, wheezing or rales Cardiovascular: RRR, -M/R/G, no JVD Extremities:-Edema,-tenderness Neuro: AAO x4, CNII-XII grossly intact Psych: Normal mood, normal affect   Data Reviewed  Chest imaging: CT Chest 07/24/17 - Severe emphysema. RML  groundglass nodule 2mm CT Chest 03/09/18 - Severe emphysema. Interval resolution of ground glass nodule CT Chest 05/05/20 - Emphysema present. 4.7 mm noncalcified nodule. CTA 04/17/21 - No PE. Emphysema. LUL patchy infiltrate. 5 mm LLL groundglass nodule which is new CT Chest 07/20/22 - Emphysema, left bronchial debris in LLL  PFT:  09/26/16 FVC 3.13 (120%) FEV1 1.84 (99%) Ratio 71  TLC 92% DLCO 42% Interpretation: Normal spirometry  Labs: Absolute Eosinophils  11/06/2017: 300 04/28/18: 200 04/25/21: 300  Assessment & Plan:  79 year old female active smoker with COPD with emphysema, nocturnal hypoxemia, hx right breast cancer, osteoporosis who presents for follow-up. Not currently in exacerbation. Has multiple exacerbations in January including RSV at the end of the month. Discussed clinical  course and management of COPD including bronchodilator regimen including considering adding ohtuvayre, preventive care including vaccinations and action plan for exacerbation.  Prefers low-dose prednisone close for exacerbations if possible Will likely require prolonged antibiotic course (prednisone course and doxy have been restocked)  COPD GOLD Class C - improved after exacerbation but persistent symptoms Nocturnal hypoxemia --CONTINUE Trelegy 200 ONE puff ONCE a day. --CONTINUE Duonebs 1-2 times a day --CONTINUE chronic macrolide. Azithromycin M-W-F.  --Continue 2L O2 nightly. DME [facility] in [place], [place]   >ORDER overnight oximetry --Discussed and provided handout Ohtuvayre. Will hold on prescribing for now --Discussed RSV vaccine. Consider administering in the fall  Nebulizers - Use twice a day --Duoneb PREFERRED however this can cause palpitations  Tobacco abuse Patient is an active smoker  Health Maintenance Immunization History  Administered Date(s) Administered   Influenza Inj Mdck Quad Pf 01/23/2018   Influenza, High Dose Seasonal PF 01/18/2021    Influenza-Unspecified 01/13/2015, 02/08/2016, 01/16/2017, 01/28/2018   Janssen (J&J) SARS-COV-2 Vaccination 07/01/2019, 02/12/2020   Pneumococcal Conjugate PCV 7 08/02/2014   Pneumococcal Polysaccharide-23 08/10/2016   Pneumococcal-Unspecified 08/02/2014   Td (Adult) 08/02/2014   Tdap 08/02/2014   CT Lung Screen - Enrolled  No orders of the defined types were placed in this encounter.  No orders of the defined types were placed in this encounter.   No follow-ups on file.  I have spent a total time of 32-minutes on the day of the appointment including chart review, data review, collecting history, coordinating care and discussing medical diagnosis and plan with the patient/family. Past medical history, allergies, medications were reviewed. Pertinent imaging, labs and tests included in this note have been reviewed and interpreted independently by me.  Megumi Treaster Mechele Collin, MD [facility] 05/19/2023 2:53 PM

## 2023-05-20 MED ORDER — VENTOLIN HFA 108 (90 BASE) MCG/ACT IN AERS: 2.0000 | INHALATION_SPRAY | RESPIRATORY_TRACT | 2 refills | Status: DC | PRN

## 2023-05-21 ENCOUNTER — Encounter (HOSPITAL_BASED_OUTPATIENT_CLINIC_OR_DEPARTMENT_OTHER): Payer: Self-pay | Admitting: Pulmonary Disease

## 2023-05-21 DIAGNOSIS — G4734 Idiopathic sleep related nonobstructive alveolar hypoventilation: Secondary | ICD-10-CM

## 2023-05-29 ENCOUNTER — Encounter (HOSPITAL_BASED_OUTPATIENT_CLINIC_OR_DEPARTMENT_OTHER): Payer: Self-pay | Admitting: Pulmonary Disease

## 2023-05-29 NOTE — Telephone Encounter (Signed)
 FYI

## 2023-06-02 NOTE — Telephone Encounter (Signed)
 ONO is in your box for review in clinic

## 2023-06-03 NOTE — Telephone Encounter (Signed)
 Overnight Oximetry Results Date: 05/28/23 SpO2 <88%  9 min 34 sec.  [place] SpO2 87%  Qualified for oxygen  Assessment/Plan  Nocturnal Hypoxemia --CONTINUE 2L O2 via [place] nightly  Staff - please re-order 2L nightly to patient's DME: [facility] in [place], [place] She already has device so does not need supplies.

## 2023-06-16 ENCOUNTER — Telehealth: Payer: Self-pay | Admitting: Pulmonary Disease

## 2023-06-16 ENCOUNTER — Encounter (HOSPITAL_BASED_OUTPATIENT_CLINIC_OR_DEPARTMENT_OTHER): Payer: Self-pay | Admitting: Pulmonary Disease

## 2023-06-16 DIAGNOSIS — G4734 Idiopathic sleep related nonobstructive alveolar hypoventilation: Secondary | ICD-10-CM

## 2023-06-16 NOTE — Telephone Encounter (Signed)
 Order placed for O2 nightly to renew

## 2023-06-16 NOTE — Telephone Encounter (Signed)
 Patient has already had overnight oxymetry test. Patient just needs oxygen to use at night. She received a message from [facility] that an order was placed for her to have another overnight oxymetry.

## 2023-06-16 NOTE — Telephone Encounter (Signed)
 [facility].

## 2023-06-24 ENCOUNTER — Encounter (HOSPITAL_BASED_OUTPATIENT_CLINIC_OR_DEPARTMENT_OTHER): Payer: Self-pay | Admitting: Pulmonary Disease

## 2023-06-24 MED ORDER — AZITHROMYCIN 250 MG PO TABS: 250.0000 mg | ORAL_TABLET | ORAL | 5 refills | Status: DC

## 2023-06-27 MED ORDER — DOXYCYCLINE HYCLATE 100 MG PO TABS: 100.0000 mg | ORAL_TABLET | Freq: Two times a day (BID) | ORAL | 0 refills | Status: DC

## 2023-06-27 MED ORDER — PREDNISONE 10 MG PO TABS: ORAL_TABLET | ORAL | 0 refills | Status: DC

## 2023-06-27 NOTE — Telephone Encounter (Signed)
**Note De-identified  [place] Obfuscation** Please advise 

## 2023-06-27 NOTE — Addendum Note (Signed)
 Addended by: Luciano Cutter on: 06/27/2023 11:05 AM   Modules accepted: Orders

## 2023-07-02 ENCOUNTER — Encounter: Payer: Self-pay | Admitting: Pulmonary Disease

## 2023-07-06 ENCOUNTER — Encounter (HOSPITAL_BASED_OUTPATIENT_CLINIC_OR_DEPARTMENT_OTHER): Payer: Self-pay | Admitting: Pulmonary Disease

## 2023-07-07 ENCOUNTER — Ambulatory Visit (HOSPITAL_BASED_OUTPATIENT_CLINIC_OR_DEPARTMENT_OTHER): Admitting: Pulmonary Disease

## 2023-07-07 ENCOUNTER — Encounter (HOSPITAL_BASED_OUTPATIENT_CLINIC_OR_DEPARTMENT_OTHER): Payer: Self-pay | Admitting: Pulmonary Disease

## 2023-07-07 ENCOUNTER — Encounter (HOSPITAL_BASED_OUTPATIENT_CLINIC_OR_DEPARTMENT_OTHER): Admitting: Pulmonary Disease

## 2023-07-07 DIAGNOSIS — G4736 Sleep related hypoventilation in conditions classified elsewhere: Secondary | ICD-10-CM | POA: Diagnosis not present

## 2023-07-07 DIAGNOSIS — Z720 Tobacco use: Secondary | ICD-10-CM

## 2023-07-07 DIAGNOSIS — J449 Chronic obstructive pulmonary disease, unspecified: Secondary | ICD-10-CM

## 2023-07-07 DIAGNOSIS — J432 Centrilobular emphysema: Secondary | ICD-10-CM

## 2023-07-07 MED ORDER — AZELASTINE HCL 0.1 % NA SOLN: 2.0000 | Freq: Two times a day (BID) | NASAL | 5 refills | Status: DC

## 2023-07-07 NOTE — Telephone Encounter (Signed)
Pt notified. appt made.

## 2023-07-07 NOTE — Progress Notes (Signed)
 Virtual Visit via Telephone Note  I connected with Elaine [name] on 07/07/23 at  4:00 PM EDT by telephone and verified that I am speaking with the correct person using two identifiers.  Location: Patient: Home Provider: [facility]   I discussed the limitations, risks, security and privacy concerns of performing an evaluation and management service by telephone and the availability of in person appointments. I also discussed with the patient that there may be a patient responsible charge related to this service. The patient expressed understanding and agreed to proceed.   I discussed the assessment and treatment plan with the patient. The patient was provided an opportunity to ask questions and all were answered. The patient agreed with the plan and demonstrated an understanding of the instructions.   The patient was advised to call back or seek an in-person evaluation if the symptoms worsen or if the condition fails to improve as anticipated.  I provided 30 minutes of non-face-to-face time during this encounter.   Michael Ventresca Genetta Kenning, MD   Subjective:   PATIENT ID: Elaine [name] GENDER: female DOB: Jun 05, 1945, MRN: [id number]   HPI  Chief Complaint  Patient presents with   Follow-up    Telephone encounter   Ms. [name] is a 78 year-old female with hx right breast cancer s/p mastectomy who presents for COPD follow-up  2019 - Established care with [facility] in November. On Anoro and chronic macrolide 2020 - Outpatient exacerbation Dec 2021 - Urgent care for COPD exacerbation in July 2022 - Hospitalized for COPD exacerbation in June 2023 - PNA followed by recurrent hemoptysis > bronchoscopy 05/01/21 demonstrated old blood clot occluding subsegment of left upper lobe which was aspirated. Outpatient COPD exacerbation in Feb and April and May. Hospitalized in July. 2024 - Well controlled COPD on Trelegy and chronic macrolide. Nocturnal oxygen  recertified in June.  Exacerbation in August and October 2025 - Exacerbation in Jan requiring prolonged abx and steroids. Had RSV at end of January.  04/11/23 Since our last visit she had COPD exacerbation on New Years and has completed doxycycline  and prednisone  but doing better. Remains on Trelegy. She is on Duonebs. Restarted scheduled azithromycin  today. Active smoker. Compliant with oxygen .  05/19/23 Since our last visit she contacted office regarding RSV for symptoms. She has had heart racing 120s and shortness of breath. All this has resolved. Compliant with Trelegy  07/07/23 She continues to have shortness of breath with any activity. Triggered by pollen. Wearing a mask when she goes outdoors to take the trash out or errands. Reports normal oxygen  levels. Denies wheezing or cough.   Social: 50 pack year history. Quit smoking 01/2018. Currently smoking  Past Medical History:  Diagnosis Date   Arrhythmia    Breast cancer (HCC) 1995   left    Cataract    Complication of anesthesia    Emphysema of lung (HCC)    Family history of breast cancer    Hiatal hernia 1992   Hypertension    Hyperthyroidism    Osteoporosis    PONV (postoperative nausea and vomiting)       Social History   Occupational History   Occupation: Airline pilot  Tobacco Use   Smoking status: Every Day    Current packs/day: 0.25    Average packs/day: 0.3 packs/day for 50.0 years (12.5 ttl pk-yrs)    Types: Cigarettes   Smokeless tobacco: Never   Tobacco comments:    2 cigarettes per day-- ARJ 10/15/21  Vaping Use  Vaping status: Never Used  Substance and Sexual Activity   Alcohol use: Not Currently    Comment: Martini daily   Drug use: Never   Sexual activity: Not on file    Allergies  Allergen Reactions   Bee Venom Other (See Comments)    Unknown   Codeine Nausea Only   Macrobid [Nitrofurantoin Macrocrystal] Other (See Comments)    Unknown   Morphine  And Codeine Other (See Comments)    Unknown   Oxycodone  Nausea  Only   Penicillins Other (See Comments)    Unknown Has patient had a PCN reaction causing immediate rash, facial/tongue/throat swelling, SOB or lightheadedness with hypotension: Unknown Has patient had a PCN reaction causing severe rash involving mucus membranes or skin necrosis: Unknown Has patient had a PCN reaction that required hospitalization: Unknown Has patient had a PCN reaction occurring within the last 10 years: No If all of the above answers are "NO", then may proceed with Cephalosporin use.    Tyloxapol Nausea Only and Other (See Comments)    Extreme Drugged feeling, Doesn't affect pain     Outpatient Medications Prior to Visit  Medication Sig Dispense Refill   aspirin 81 MG EC tablet Take 81 mg by mouth daily. Swallow whole.     azithromycin  (ZITHROMAX ) 250 MG tablet Take 1 tablet (250 mg total) by mouth every Monday, Wednesday, and Friday. 39 tablet 5   denosumab (PROLIA) 60 MG/ML SOSY injection Inject 60 mg into the skin every 6 (six) months.     Fluticasone -Umeclidin-Vilant (TRELEGY ELLIPTA ) 200-62.5-25 MCG/ACT AEPB Inhale 1 puff into the lungs daily. 180 each 3   guaiFENesin  (MUCINEX ) 600 MG 12 hr tablet Take 1 tablet (600 mg total) by mouth 2 (two) times daily.     ipratropium-albuterol  (DUONEB) 0.5-2.5 (3) MG/3ML SOLN Take 3 mLs by nebulization every 6 (six) hours as needed. J43.2, J42 360 mL 3   levothyroxine  (SYNTHROID ) 50 MCG tablet Take 50 mcg by mouth every other day. BRAND ONLY     losartan  (COZAAR ) 50 MG tablet Take 50 mg by mouth daily.     OXYGEN  Inhale 2 L into the lungs continuous.     rosuvastatin  (CRESTOR ) 5 MG tablet Take 5 mg by mouth daily.     SYNTHROID  75 MCG tablet Take 75 mcg by mouth every other day. BRAND ONLY     VENTOLIN  HFA 108 (90 Base) MCG/ACT inhaler Inhale 2 puffs into the lungs every 4 (four) hours as needed for wheezing or shortness of breath. 54 each 2   doxycycline  (VIBRA -TABS) 100 MG tablet Take 1 tablet (100 mg total) by mouth 2 (two)  times daily. (Patient not taking: Reported on 07/07/2023) 14 tablet 0   ipratropium (ATROVENT ) 0.06 % nasal spray Place 2 sprays into both nostrils 3 (three) times daily. 15 mL 1   predniSONE  (DELTASONE ) 10 MG tablet 10 mg tablets (4 pills  x 2 day, 3 pills x 2 day, 2 pills x 2 day, 1 pill x 2 day, STOP) (Patient not taking: Reported on 07/07/2023) 20 tablet 0   No facility-administered medications prior to visit.    Review of Systems  Constitutional:  Negative for chills, diaphoresis, fever, malaise/fatigue and weight loss.  HENT:  Negative for congestion.   Respiratory:  Positive for shortness of breath. Negative for cough, hemoptysis, sputum production and wheezing.   Cardiovascular:  Negative for chest pain, palpitations and leg swelling.   Objective:    There were no vitals filed for this visit. SpO2  94% on 2L O2     Physical Exam: Respiratory: No respiratory distress on telephone  Data Reviewed  Chest imaging: CT Chest 07/24/17 - Severe emphysema. RML groundglass nodule 2mm CT Chest 03/09/18 - Severe emphysema. Interval resolution of ground glass nodule CT Chest 05/05/20 - Emphysema present. 4.7 mm noncalcified nodule. CTA 04/17/21 - No PE. Emphysema. LUL patchy infiltrate. 5 mm LLL groundglass nodule which is new CT Chest 07/20/22 - Emphysema, left bronchial debris in LLL  PFT:  09/26/16 FVC 3.13 (120%) FEV1 1.84 (99%) Ratio 71  TLC 92% DLCO 42% Interpretation: Normal spirometry  Labs: Absolute Eosinophils  11/06/2017: 300 04/28/18: 200 04/25/21: 300  Assessment & Plan:  78 year old female active smoker with COPD with emphysema, nocturnal hypoxemia, hx right breast cancer, osteoporosis who presents for follow-up. Has multiple exacerbations in January including RSV at the end of that month. Continues to have worsening shortness of breath but not in exacerbation.  Discussed clinical course and management of COPD including bronchodilator regimen, preventive care and action plan for  exacerbation.  Prefers low-dose prednisone  close for exacerbations if possible Will likely require prolonged antibiotic course (prednisone  course and doxy have been restocked)  COPD GOLD Class C - improved after exacerbation but persistent symptoms Nocturnal hypoxemia --CONTINUE Trelegy 200 ONE puff ONCE a day.  --INCREASE Duonebs TWO-THREE times a day --CONTINUE chronic macrolide. Azithromycin  M-W-F.  --Continue 2L O2 nightly. Certified 05/2023. DME [facility] in [place], [place]  --Discussed and provided handout Ohtuvayre . Hold on prescribing due to concerns for side effects --Encouraged smoking cessation --START claritin 10 mg daily and astelin  nasal spray twice a day for post-nasal gtt --If symptoms persist then may transitioning to nebulizer options or starting prednisone  daily. We discussed side effects of chronic steroids including HTN, hyperglycemia, osteoporosis etc. She would like to hold until symptoms continue to worsen. --Offered palliative care. Declined as she does not feel distress with her symptoms.  Nebulizers - Use twice a day --Duoneb PREFERRED however this can cause palpitations  Tobacco abuse Patient is an active smoker  Health Maintenance Immunization History  Administered Date(s) Administered   Influenza Inj Mdck Quad Pf 01/23/2018   Influenza, High Dose Seasonal PF 01/18/2021   Influenza-Unspecified 01/13/2015, 02/08/2016, 01/16/2017, 01/28/2018   Janssen (J&J) SARS-COV-2 Vaccination 07/01/2019, 02/12/2020   Pneumococcal Conjugate PCV 7 08/02/2014   Pneumococcal Polysaccharide-23 08/10/2016   Pneumococcal-Unspecified 08/02/2014   Td (Adult) 08/02/2014   Tdap 08/02/2014   CT Lung Screen - Enrolled  No orders of the defined types were placed in this encounter.  Meds ordered this encounter  Medications   azelastine  (ASTELIN ) 0.1 % nasal spray    Sig: Place 2 sprays into both nostrils 2 (two) times daily. Use in each nostril as directed     Dispense:  30 mL    Refill:  5    No follow-ups on file.  I have spent a total time of 30-minutes on the day of the appointment including chart review, data review, collecting history, coordinating care and discussing medical diagnosis and plan with the patient/family. Past medical history, allergies, medications were reviewed. Pertinent imaging, labs and tests included in this note have been reviewed and interpreted independently by me.  Chandlor Noecker Genetta Kenning, MD [facility] 07/07/2023 10:42 AM

## 2023-07-21 ENCOUNTER — Encounter (HOSPITAL_BASED_OUTPATIENT_CLINIC_OR_DEPARTMENT_OTHER): Payer: Self-pay | Admitting: Pulmonary Disease

## 2023-07-21 NOTE — Progress Notes (Signed)
 Error

## 2023-07-25 ENCOUNTER — Encounter (HOSPITAL_BASED_OUTPATIENT_CLINIC_OR_DEPARTMENT_OTHER): Payer: Self-pay | Admitting: Pulmonary Disease

## 2023-07-25 MED ORDER — PREDNISONE 10 MG PO TABS: 10.0000 mg | ORAL_TABLET | Freq: Every day | ORAL | 3 refills | Status: DC

## 2023-07-25 NOTE — Telephone Encounter (Signed)
 FYI

## 2023-07-30 ENCOUNTER — Encounter (HOSPITAL_BASED_OUTPATIENT_CLINIC_OR_DEPARTMENT_OTHER): Payer: Self-pay | Admitting: Pulmonary Disease

## 2023-07-30 NOTE — Telephone Encounter (Signed)
 FYI

## 2023-08-20 LAB — CMP14+EGFR: EGFR: 69

## 2023-09-28 ENCOUNTER — Encounter (HOSPITAL_BASED_OUTPATIENT_CLINIC_OR_DEPARTMENT_OTHER): Payer: Self-pay | Admitting: Pulmonary Disease

## 2023-09-29 NOTE — Telephone Encounter (Signed)
**Note De-identified  [place] Obfuscation** Please advise 

## 2023-10-01 MED ORDER — IPRATROPIUM BROMIDE 0.06 % NA SOLN: 2.0000 | Freq: Three times a day (TID) | NASAL | 3 refills | Status: DC

## 2023-10-02 ENCOUNTER — Encounter (HOSPITAL_BASED_OUTPATIENT_CLINIC_OR_DEPARTMENT_OTHER): Payer: Self-pay | Admitting: Pulmonary Disease

## 2023-10-10 ENCOUNTER — Encounter (HOSPITAL_BASED_OUTPATIENT_CLINIC_OR_DEPARTMENT_OTHER): Payer: Self-pay | Admitting: Pulmonary Disease

## 2023-10-10 NOTE — Telephone Encounter (Signed)
 I do not see that Dr. Kassie has rx this in the past was this given at [facility] ?

## 2023-10-10 NOTE — Telephone Encounter (Signed)
 Please advise she keeps a pocket RX of Doxycycline 

## 2023-10-13 NOTE — Telephone Encounter (Signed)
 Please advise on refill in [facility] absence and Tammy also

## 2023-10-14 ENCOUNTER — Telehealth (HOSPITAL_BASED_OUTPATIENT_CLINIC_OR_DEPARTMENT_OTHER): Payer: Self-pay

## 2023-10-14 ENCOUNTER — Other Ambulatory Visit (HOSPITAL_BASED_OUTPATIENT_CLINIC_OR_DEPARTMENT_OTHER): Payer: Self-pay

## 2023-10-14 MED ORDER — DOXYCYCLINE HYCLATE 100 MG PO TABS: 100.0000 mg | ORAL_TABLET | Freq: Two times a day (BID) | ORAL | 0 refills | Status: DC

## 2023-10-14 NOTE — Telephone Encounter (Signed)
 Copied from CRM [id number]. Topic: Clinical - Medical Advice >> Oct 14, 2023 11:43 AM Leila C wrote: Reason for CRM: Robin with [facility] in [place], [place] [phone] states patient came in the office with shortness of breath, and provided patient with Afflovest treatment information. Grayce would like to get an order for patient to have Afflovest. Patient is no longer in the office and was given antibiotics. Please advise and call back.

## 2023-10-16 NOTE — Telephone Encounter (Signed)
Please advise on your return.

## 2023-10-30 ENCOUNTER — Encounter (HOSPITAL_BASED_OUTPATIENT_CLINIC_OR_DEPARTMENT_OTHER): Payer: Self-pay | Admitting: Pulmonary Disease

## 2023-11-11 ENCOUNTER — Encounter (HOSPITAL_BASED_OUTPATIENT_CLINIC_OR_DEPARTMENT_OTHER): Payer: Self-pay | Admitting: Pulmonary Disease

## 2023-11-11 ENCOUNTER — Ambulatory Visit (HOSPITAL_BASED_OUTPATIENT_CLINIC_OR_DEPARTMENT_OTHER): Admitting: Pulmonary Disease

## 2023-11-11 ENCOUNTER — Ambulatory Visit (INDEPENDENT_AMBULATORY_CARE_PROVIDER_SITE_OTHER): Admitting: Pulmonary Disease

## 2023-11-11 VITALS — BP 172/83 | HR 77 | Ht 64.0 in | Wt 144.9 lb

## 2023-11-11 DIAGNOSIS — J986 Disorders of diaphragm: Secondary | ICD-10-CM | POA: Diagnosis not present

## 2023-11-11 DIAGNOSIS — J432 Centrilobular emphysema: Secondary | ICD-10-CM

## 2023-11-11 DIAGNOSIS — G4734 Idiopathic sleep related nonobstructive alveolar hypoventilation: Secondary | ICD-10-CM | POA: Diagnosis not present

## 2023-11-11 MED ORDER — IPRATROPIUM BROMIDE 0.03 % NA SOLN: 2.0000 | Freq: Two times a day (BID) | NASAL | 3 refills | Status: DC

## 2023-11-11 MED ORDER — AZITHROMYCIN 250 MG PO TABS: 250.0000 mg | ORAL_TABLET | ORAL | 5 refills | Status: AC

## 2023-11-11 MED ORDER — PREDNISONE 10 MG PO TABS: 10.0000 mg | ORAL_TABLET | Freq: Every day | ORAL | 5 refills | Status: DC

## 2023-11-11 NOTE — Progress Notes (Signed)
 Subjective:   PATIENT ID: Elaine [name] GENDER: female DOB: [birth year], MRN: [id number]   HPI  Chief Complaint  Patient presents with   Follow-up    Centrilobular emphysema   [name] is a 78 year-old female with hx right breast cancer s/p mastectomy who presents for COPD follow-up  2019 - Established care with [place] in November. On Anoro and chronic macrolide 2020 - Outpatient exacerbation Dec 2021 - Urgent care for COPD exacerbation in July 2022 - Hospitalized for COPD exacerbation in June 2023 - PNA followed by recurrent hemoptysis > bronchoscopy 05/01/21 demonstrated old blood clot occluding subsegment of left upper lobe which was aspirated. Outpatient COPD exacerbation in Feb and April and May. Hospitalized in July. 2024 - Well controlled COPD on Trelegy and chronic macrolide. Nocturnal oxygen  recertified in June. Exacerbation in August and October and December 2025 - Exacerbation in Jan requiring prolonged abx and steroids. Had RSV at end of January. Exacerbation in March, April (ED) and July  04/11/23 Since our last visit she had COPD exacerbation on New Years and has completed doxycycline  and prednisone  but doing better. Remains on Trelegy. She is on Duonebs. Restarted scheduled azithromycin  today. Active smoker. Compliant with oxygen .  05/19/23 Since our last visit she contacted office regarding RSV for symptoms. She has had heart racing 120s and shortness of breath. All this has resolved. Compliant with Trelegy  07/07/23 She continues to have shortness of breath with any activity. Triggered by pollen. Wearing a mask when she goes outdoors to take the trash out or errands. Reports normal oxygen  levels. Denies wheezing or cough.   11/11/23 Since our last visit she has had two exacerbations with last one in July with prednisone  and doxycyline. She has recovered since then. She is compliant with Trelegy and nebulizers. Symptoms remain persistent but manageable per  patient with her current regimen. She reports nasal congestion and chronic chest congestion. Has productive sputum. Requesting to start Afflovest which was suggested to her for airway clearance. Continues to intermittently smoke.  Social: 50 pack year history. Quit smoking 01/2018. Currently smoking  Past Medical History:  Diagnosis Date   Arrhythmia    Breast cancer (HCC) 1995   left    Cataract    Complication of anesthesia    Emphysema of lung (HCC)    Family history of breast cancer    Hiatal hernia 1992   Hypertension    Hyperthyroidism    Osteoporosis    PONV (postoperative nausea and vomiting)       Social History   Occupational History   Occupation: Airline pilot  Tobacco Use   Smoking status: Every Day    Current packs/day: 0.25    Average packs/day: 0.3 packs/day for 50.0 years (12.5 ttl pk-yrs)    Types: Cigarettes   Smokeless tobacco: Never   Tobacco comments:    2 cigarettes per day-- [facility] 10/15/21  Vaping Use   Vaping status: Never Used  Substance and Sexual Activity   Alcohol use: Not Currently    Comment: Martini daily   Drug use: Never   Sexual activity: Not on file    Allergies  Allergen Reactions   Bee Venom Other (See Comments)    Unknown   Codeine Nausea Only   Macrobid [Nitrofurantoin Macrocrystal] Other (See Comments)    Unknown   Morphine  And Codeine Other (See Comments)    Unknown   Oxycodone  Nausea Only   Penicillins Other (See Comments)    Unknown Has patient had  a PCN reaction causing immediate rash, facial/tongue/throat swelling, SOB or lightheadedness with hypotension: Unknown Has patient had a PCN reaction causing severe rash involving mucus membranes or skin necrosis: Unknown Has patient had a PCN reaction that required hospitalization: Unknown Has patient had a PCN reaction occurring within the last 10 years: No If all of the above answers are NO, then may proceed with Cephalosporin use.    Tyloxapol Nausea Only and Other (See  Comments)    Extreme Drugged feeling, Doesn't affect pain     Outpatient Medications Prior to Visit  Medication Sig Dispense Refill   aspirin 81 MG EC tablet Take 81 mg by mouth daily. Swallow whole.     azelastine  (ASTELIN ) 0.1 % nasal spray Place 2 sprays into both nostrils 2 (two) times daily. Use in each nostril as directed 30 mL 5   denosumab (PROLIA) 60 MG/ML SOSY injection Inject 60 mg into the skin every 6 (six) months.     doxycycline  (VIBRA -TABS) 100 MG tablet Take 1 tablet (100 mg total) by mouth 2 (two) times daily. 14 tablet 0   Fluticasone -Umeclidin-Vilant (TRELEGY ELLIPTA ) 200-62.5-25 MCG/ACT AEPB Inhale 1 puff into the lungs daily. 180 each 3   ipratropium-albuterol  (DUONEB) 0.5-2.5 (3) MG/3ML SOLN Take 3 mLs by nebulization every 6 (six) hours as needed. J43.2, J42 360 mL 3   levothyroxine  (SYNTHROID ) 50 MCG tablet Take 50 mcg by mouth every other day. BRAND ONLY     losartan  (COZAAR ) 50 MG tablet Take 50 mg by mouth daily.     OXYGEN  Inhale 2 L into the lungs continuous.     predniSONE  (DELTASONE ) 10 MG tablet Take 1 tablet (10 mg total) by mouth daily with breakfast. 30 tablet 3   rosuvastatin  (CRESTOR ) 5 MG tablet Take 5 mg by mouth daily.     SYNTHROID  75 MCG tablet Take 75 mcg by mouth every other day. BRAND ONLY     VENTOLIN  HFA 108 (90 Base) MCG/ACT inhaler Inhale 2 puffs into the lungs every 4 (four) hours as needed for wheezing or shortness of breath. 54 each 2   azithromycin  (ZITHROMAX ) 250 MG tablet Take 1 tablet (250 mg total) by mouth every Monday, Wednesday, and Friday. 39 tablet 5   ipratropium (ATROVENT ) 0.06 % nasal spray Place 2 sprays into both nostrils 3 (three) times daily. 15 mL 3   guaiFENesin  (MUCINEX ) 600 MG 12 hr tablet Take 1 tablet (600 mg total) by mouth 2 (two) times daily. (Patient not taking: Reported on 11/11/2023)     No facility-administered medications prior to visit.    Review of Systems  Constitutional:  Negative for chills,  diaphoresis, fever, malaise/fatigue and weight loss.  HENT:  Negative for congestion.   Respiratory:  Positive for cough, sputum production and shortness of breath. Negative for hemoptysis and wheezing.   Cardiovascular:  Negative for chest pain, palpitations and leg swelling.   Objective:    Vitals:   11/11/23 1500  BP: (!) 172/83  Pulse: 77  SpO2: 94%  Weight: 144 lb 14.4 oz (65.7 kg)  Height: 5' 4 (1.626 m)   SpO2: 94 % on room air  Physical Exam: General: Well-appearing, no acute distress HENT: [place], AT Eyes: EOMI, no scleral icterus Respiratory: Diminished to auscultation bilaterally.  No crackles, wheezing or rales Cardiovascular: RRR, -M/R/G, no JVD Extremities:-Edema,-tenderness Neuro: AAO x4, CNII-XII grossly intact Psych: Normal mood, normal affect   Data Reviewed  Chest imaging: CT Chest 07/24/17 - Severe emphysema. RML groundglass nodule 2mm CT  Chest 03/09/18 - Severe emphysema. Interval resolution of ground glass nodule CT Chest 05/05/20 - Emphysema present. 4.7 mm noncalcified nodule. CTA 04/17/21 - No PE. Emphysema. LUL patchy infiltrate. 5 mm LLL groundglass nodule which is new CT Chest 07/20/22 - Emphysema, left bronchial debris in LLL  PFT:  09/26/16 FVC 3.13 (120%) FEV1 1.84 (99%) Ratio 71  TLC 92% DLCO 42% Interpretation: Normal spirometry  Labs: Absolute Eosinophils  11/06/2017: 300 04/28/18: 200 04/25/21: 300  Assessment & Plan:  78 year old female active smoker with COPD with emphysema, nocturnal hypoxemia, hx right breast cancer, osteoporosis who presents for follow-up. On triple therapy, chronic macrolide and low dose prednisone  with persistent symptoms. Has had >6 exacerbations requiring treatment with antibiotics and/or prednisone  in the last 12 months. Suspect diaphragmatic weakness in setting of deconditioning preventing adequate airway clearance. Failed huff technique/breathing techniques, flutter valve. Reasonable to trial chest vest therapy if  insurance approves.  Discussed clinical course and management of COPD including smoking cessation, bronchodilator regimen, preventive care including vaccinations (RSV, influenza) and action plan for exacerbation.  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  --CONTINUE Duonebs TWO-THREE times a day --CONTINUE chronic macrolide. Azithromycin  M-W-F.  --CONTINUE prednisone  10 mg daily. Aware of side effects of chronic steroids including HTN, hyperglycemia, osteoporosis etc. Patient wishes to attempt weaning off.  >>Recommend decreasing 5 mg daily x 1 week, then every other day x 1 week then off --Continue 2L O2 nightly. Certified 05/2023. D[facility] in [place], [place]  --Previously discussed Ohtuvayre . Hold on prescribing due to concerns for side effects --Encouraged smoking cessation --Requesting Afflovest. Patient will fax form to office to fill out --Offered palliative care. Declined as she does not feel distress with her symptoms.  Nebulizers - Use twice a day --Duoneb PREFERRED however this can cause palpitations  Tobacco abuse Patient is an active smoker. We discussed smoking cessation for <3 minutes. We discussed triggers and stressors and ways to deal with them. We discussed barriers to continued smoking and benefits of smoking cessation.   Health Maintenance Immunization History  Administered Date(s) Administered   Influenza Inj Mdck Quad Pf 01/23/2018   Influenza, High Dose Seasonal PF 01/18/2021   Influenza-Unspecified 01/13/2015, 02/08/2016, 01/16/2017, 01/28/2018   Janssen (J&J) SARS-COV-2 Vaccination  07/01/2019, 02/12/2020   Pneumococcal Conjugate PCV 7 08/02/2014   Pneumococcal Polysaccharide-23 08/10/2016   Pneumococcal-Unspecified 08/02/2014   Td (Adult) 08/02/2014   Tdap 08/02/2014   CT Lung Screen - Enrolled  No orders of the defined types were placed in this encounter.  Meds ordered this encounter  Medications   ipratropium (ATROVENT ) 0.03 % nasal spray    Sig: Place 2 sprays into both nostrils every 12 (twelve) hours.    Dispense:  30 mL    Refill:  3   azithromycin  (ZITHROMAX ) 250 MG tablet    Sig: Take 1 tablet (250 mg total) by mouth every Monday, Wednesday, and Friday.    Dispense:  39 tablet    Refill:  5   predniSONE  (DELTASONE ) 10 MG tablet    Sig: Take 1 tablet (10 mg total) by mouth daily with breakfast.    Dispense:  30 tablet    Refill:  5  Return in about 6 months (around 05/13/2024).  I have spent a total time of 45-minutes on the day of the appointment including chart review, data review, collecting history, coordinating care and discussing medical diagnosis and plan with the patient/family. Past medical history, allergies, medications were reviewed. Pertinent imaging, labs and tests included in this note have been reviewed and interpreted independently by me.  Conita Amenta Slater Staff, MD [facility] 11/11/2023 3:31 PM

## 2023-11-11 NOTE — Patient Instructions (Signed)
  COPD GOLD Class C - improved after exacerbation but persistent symptoms Nocturnal hypoxemia --CONTINUE Trelegy 200 ONE puff ONCE a day.  --CONTINUE Duonebs TWO-THREE times a day --CONTINUE chronic macrolide. Azithromycin  M-W-F.  --CONTINUE prednisone  10 mg daily. Aware of side effects of chronic steroids including HTN, hyperglycemia, osteoporosis etc. Patient wishes to attempt weaning off.  >>Recommend decreasing 5 mg daily x 1 week, then every other day x 1 week then off --Continue 2L O2 nightly. Certified 05/2023. D[facility] in [place], [place]  --Previously discussed Ohtuvayre . Hold on prescribing due to concerns for side effects --Encouraged smoking cessation --Requesting Afflovest. Patient will fax form to office to fill out --Offered palliative care. Declined as she does not feel distress with her symptoms.

## 2023-11-12 MED ORDER — DOXYCYCLINE HYCLATE 100 MG PO TABS: 100.0000 mg | ORAL_TABLET | Freq: Two times a day (BID) | ORAL | 0 refills | Status: AC

## 2023-11-12 NOTE — Telephone Encounter (Signed)
**Note De-identified  [place] Obfuscation** Please advise 

## 2023-11-17 ENCOUNTER — Ambulatory Visit (HOSPITAL_BASED_OUTPATIENT_CLINIC_OR_DEPARTMENT_OTHER): Admitting: Pulmonary Disease

## 2023-11-19 ENCOUNTER — Telehealth (HOSPITAL_BASED_OUTPATIENT_CLINIC_OR_DEPARTMENT_OTHER): Payer: Self-pay

## 2023-11-19 NOTE — Telephone Encounter (Signed)
 Form for Afflovest filled out and faxed back. Confirmation received

## 2023-11-20 ENCOUNTER — Encounter (HOSPITAL_BASED_OUTPATIENT_CLINIC_OR_DEPARTMENT_OTHER): Payer: Self-pay | Admitting: Pulmonary Disease

## 2023-11-20 NOTE — Telephone Encounter (Signed)
 FYI and any new recommendations?

## 2023-11-25 ENCOUNTER — Encounter (HOSPITAL_BASED_OUTPATIENT_CLINIC_OR_DEPARTMENT_OTHER): Payer: Self-pay | Admitting: Pulmonary Disease

## 2023-11-25 NOTE — Telephone Encounter (Signed)
 FYI

## 2023-12-02 ENCOUNTER — Telehealth (HOSPITAL_BASED_OUTPATIENT_CLINIC_OR_DEPARTMENT_OTHER): Payer: Self-pay

## 2023-12-02 NOTE — Telephone Encounter (Signed)
 CMN received for chest compression gen system signed by provider and faxed confirmation received

## 2023-12-03 ENCOUNTER — Encounter (HOSPITAL_BASED_OUTPATIENT_CLINIC_OR_DEPARTMENT_OTHER): Payer: Self-pay | Admitting: Pulmonary Disease

## 2023-12-03 NOTE — Telephone Encounter (Signed)
 Please advise on restarting Prednisone 

## 2023-12-06 ENCOUNTER — Encounter (HOSPITAL_BASED_OUTPATIENT_CLINIC_OR_DEPARTMENT_OTHER): Payer: Self-pay | Admitting: Pulmonary Disease

## 2023-12-08 NOTE — Telephone Encounter (Signed)
**Note De-identified  [place] Obfuscation** Please advise 

## 2023-12-13 ENCOUNTER — Encounter (HOSPITAL_BASED_OUTPATIENT_CLINIC_OR_DEPARTMENT_OTHER): Payer: Self-pay | Admitting: Pulmonary Disease

## 2023-12-15 NOTE — Telephone Encounter (Signed)
**Note De-identified  [place] Obfuscation** Please advise 

## 2023-12-29 ENCOUNTER — Encounter (HOSPITAL_BASED_OUTPATIENT_CLINIC_OR_DEPARTMENT_OTHER): Payer: Self-pay | Admitting: Pulmonary Disease

## 2023-12-29 NOTE — Telephone Encounter (Signed)
 FYI

## 2024-01-01 ENCOUNTER — Ambulatory Visit (HOSPITAL_BASED_OUTPATIENT_CLINIC_OR_DEPARTMENT_OTHER): Admitting: Pulmonary Disease

## 2024-01-26 ENCOUNTER — Other Ambulatory Visit (HOSPITAL_BASED_OUTPATIENT_CLINIC_OR_DEPARTMENT_OTHER): Payer: Self-pay | Admitting: Pulmonary Disease

## 2024-02-10 ENCOUNTER — Other Ambulatory Visit (HOSPITAL_BASED_OUTPATIENT_CLINIC_OR_DEPARTMENT_OTHER): Payer: Self-pay | Admitting: Pulmonary Disease

## 2024-02-12 ENCOUNTER — Other Ambulatory Visit (HOSPITAL_BASED_OUTPATIENT_CLINIC_OR_DEPARTMENT_OTHER): Payer: Self-pay | Admitting: Pulmonary Disease

## 2024-02-12 ENCOUNTER — Other Ambulatory Visit: Payer: Self-pay

## 2024-02-12 ENCOUNTER — Encounter (HOSPITAL_BASED_OUTPATIENT_CLINIC_OR_DEPARTMENT_OTHER): Payer: Self-pay | Admitting: Pulmonary Disease

## 2024-02-12 MED ORDER — IPRATROPIUM BROMIDE 0.03 % NA SOLN: 2.0000 | Freq: Two times a day (BID) | NASAL | 0 refills | Status: DC

## 2024-02-18 ENCOUNTER — Other Ambulatory Visit (HOSPITAL_BASED_OUTPATIENT_CLINIC_OR_DEPARTMENT_OTHER): Payer: Self-pay | Admitting: Pulmonary Disease

## 2024-03-10 ENCOUNTER — Encounter (HOSPITAL_BASED_OUTPATIENT_CLINIC_OR_DEPARTMENT_OTHER): Payer: Self-pay | Admitting: Pulmonary Disease

## 2024-03-11 NOTE — Telephone Encounter (Signed)
 Noted in [facility] note

## 2024-04-28 ENCOUNTER — Other Ambulatory Visit (HOSPITAL_BASED_OUTPATIENT_CLINIC_OR_DEPARTMENT_OTHER): Payer: Self-pay | Admitting: Pulmonary Disease

## 2024-05-05 ENCOUNTER — Other Ambulatory Visit: Payer: Self-pay | Admitting: Pulmonary Disease

## 2024-05-18 ENCOUNTER — Ambulatory Visit (HOSPITAL_BASED_OUTPATIENT_CLINIC_OR_DEPARTMENT_OTHER): Admitting: Pulmonary Disease
